# Patient Record
Sex: Female | Born: 1945 | Race: White | Hispanic: No | Marital: Married | Smoking: Never smoker
Health system: Southern US, Community
[De-identification: ages and names within clinical notes are randomized; demographics above are authoritative.]

## PROBLEM LIST (undated history)

## (undated) DIAGNOSIS — R4781 Slurred speech: Secondary | ICD-10-CM

## (undated) DIAGNOSIS — I10 Essential (primary) hypertension: Secondary | ICD-10-CM

## (undated) DIAGNOSIS — J45909 Unspecified asthma, uncomplicated: Secondary | ICD-10-CM

## (undated) DIAGNOSIS — G4730 Sleep apnea, unspecified: Secondary | ICD-10-CM

## (undated) DIAGNOSIS — G20 Parkinson's disease: Secondary | ICD-10-CM

## (undated) DIAGNOSIS — J449 Chronic obstructive pulmonary disease, unspecified: Secondary | ICD-10-CM

## (undated) DIAGNOSIS — I519 Heart disease, unspecified: Secondary | ICD-10-CM

## (undated) DIAGNOSIS — M797 Fibromyalgia: Secondary | ICD-10-CM

## (undated) DIAGNOSIS — K219 Gastro-esophageal reflux disease without esophagitis: Secondary | ICD-10-CM

## (undated) DIAGNOSIS — M1990 Unspecified osteoarthritis, unspecified site: Secondary | ICD-10-CM

## (undated) DIAGNOSIS — G20C Parkinsonism, unspecified: Secondary | ICD-10-CM

## (undated) DIAGNOSIS — G231 Progressive supranuclear ophthalmoplegia [Steele-Richardson-Olszewski]: Secondary | ICD-10-CM

## (undated) HISTORY — PX: CATARACT EXTRACTION: SUR2

## (undated) HISTORY — PX: APPENDECTOMY: SHX54

## (undated) HISTORY — PX: CHOLECYSTECTOMY: SHX55

## (undated) HISTORY — PX: SHOULDER SURGERY: SHX246

## (undated) HISTORY — DX: Fibromyalgia: M79.7

## (undated) HISTORY — PX: TUBAL LIGATION: SHX77

## (undated) HISTORY — PX: FRACTURE SURGERY: SHX138

## (undated) HISTORY — PX: EYE SURGERY: SHX253

## (undated) HISTORY — PX: BACK SURGERY: SHX140

## (undated) HISTORY — PX: ABDOMINAL HYSTERECTOMY: SHX81

## (undated) HISTORY — PX: TONSILLECTOMY: SUR1361

## (undated) HISTORY — PX: BREAST SURGERY: SHX581

## (undated) HISTORY — PX: OTHER SURGICAL HISTORY: SHX169

## (undated) HISTORY — PX: BREAST EXCISIONAL BIOPSY: SUR124

## (undated) HISTORY — DX: Heart disease, unspecified: I51.9

## (undated) HISTORY — DX: Slurred speech: R47.81

## (undated) HISTORY — DX: Progressive supranuclear ophthalmoplegia (steele-Richardson-olszewski): G23.1

---

## 2004-07-26 ENCOUNTER — Ambulatory Visit: Payer: Self-pay | Admitting: Pain Medicine

## 2004-08-09 ENCOUNTER — Ambulatory Visit: Payer: Self-pay | Admitting: Physician Assistant

## 2004-09-06 ENCOUNTER — Ambulatory Visit: Payer: Self-pay | Admitting: Physician Assistant

## 2004-12-26 ENCOUNTER — Ambulatory Visit: Payer: Self-pay | Admitting: Unknown Physician Specialty

## 2005-03-01 ENCOUNTER — Ambulatory Visit: Payer: Self-pay | Admitting: Unknown Physician Specialty

## 2005-03-27 ENCOUNTER — Ambulatory Visit: Payer: Self-pay | Admitting: Physician Assistant

## 2005-04-06 ENCOUNTER — Ambulatory Visit: Payer: Self-pay | Admitting: Pain Medicine

## 2005-04-20 ENCOUNTER — Ambulatory Visit: Payer: Self-pay | Admitting: Physician Assistant

## 2005-04-21 ENCOUNTER — Emergency Department: Payer: Self-pay | Admitting: Internal Medicine

## 2005-04-21 ENCOUNTER — Other Ambulatory Visit: Payer: Self-pay

## 2005-04-27 ENCOUNTER — Ambulatory Visit: Payer: Self-pay | Admitting: Physician Assistant

## 2005-04-28 ENCOUNTER — Ambulatory Visit: Payer: Self-pay | Admitting: Pain Medicine

## 2005-05-02 ENCOUNTER — Ambulatory Visit: Payer: Self-pay | Admitting: Physician Assistant

## 2005-05-08 ENCOUNTER — Encounter: Payer: Self-pay | Admitting: Pain Medicine

## 2005-05-10 ENCOUNTER — Ambulatory Visit: Payer: Self-pay | Admitting: Pain Medicine

## 2005-05-23 ENCOUNTER — Encounter: Payer: Self-pay | Admitting: Pain Medicine

## 2005-05-31 ENCOUNTER — Ambulatory Visit: Payer: Self-pay | Admitting: Physician Assistant

## 2005-08-31 ENCOUNTER — Ambulatory Visit: Payer: Self-pay | Admitting: Orthopaedic Surgery

## 2005-09-27 ENCOUNTER — Ambulatory Visit: Payer: Self-pay | Admitting: Physician Assistant

## 2005-10-17 ENCOUNTER — Ambulatory Visit: Payer: Self-pay | Admitting: Orthopaedic Surgery

## 2005-10-25 ENCOUNTER — Ambulatory Visit: Payer: Self-pay | Admitting: Physician Assistant

## 2006-01-25 ENCOUNTER — Ambulatory Visit: Payer: Self-pay | Admitting: Physician Assistant

## 2006-03-05 ENCOUNTER — Ambulatory Visit: Payer: Self-pay | Admitting: Internal Medicine

## 2006-03-07 ENCOUNTER — Ambulatory Visit: Payer: Self-pay | Admitting: Obstetrics and Gynecology

## 2006-05-23 ENCOUNTER — Ambulatory Visit: Payer: Self-pay | Admitting: Physician Assistant

## 2006-08-30 ENCOUNTER — Ambulatory Visit: Payer: Self-pay | Admitting: Physician Assistant

## 2006-10-01 ENCOUNTER — Ambulatory Visit: Payer: Self-pay | Admitting: Pain Medicine

## 2006-10-10 ENCOUNTER — Ambulatory Visit: Payer: Self-pay | Admitting: Pain Medicine

## 2006-10-24 ENCOUNTER — Ambulatory Visit: Payer: Self-pay | Admitting: Pain Medicine

## 2007-02-21 ENCOUNTER — Ambulatory Visit: Payer: Self-pay | Admitting: Physician Assistant

## 2007-03-12 ENCOUNTER — Ambulatory Visit: Payer: Self-pay | Admitting: Obstetrics and Gynecology

## 2007-05-17 ENCOUNTER — Ambulatory Visit: Payer: Self-pay | Admitting: Physician Assistant

## 2007-05-30 ENCOUNTER — Ambulatory Visit: Payer: Self-pay | Admitting: Pain Medicine

## 2007-06-13 ENCOUNTER — Ambulatory Visit: Payer: Self-pay | Admitting: Physician Assistant

## 2007-06-26 ENCOUNTER — Ambulatory Visit: Payer: Self-pay | Admitting: Pain Medicine

## 2007-06-27 ENCOUNTER — Ambulatory Visit: Payer: Self-pay | Admitting: Pain Medicine

## 2007-07-15 ENCOUNTER — Ambulatory Visit: Payer: Self-pay | Admitting: Physician Assistant

## 2007-07-23 ENCOUNTER — Ambulatory Visit: Payer: Self-pay | Admitting: Pain Medicine

## 2007-08-06 ENCOUNTER — Ambulatory Visit: Payer: Self-pay | Admitting: Physician Assistant

## 2007-08-23 ENCOUNTER — Ambulatory Visit: Payer: Self-pay | Admitting: Physician Assistant

## 2007-08-29 ENCOUNTER — Ambulatory Visit: Payer: Self-pay | Admitting: Pain Medicine

## 2007-09-09 ENCOUNTER — Ambulatory Visit: Payer: Self-pay | Admitting: Physician Assistant

## 2007-09-13 ENCOUNTER — Ambulatory Visit: Payer: Self-pay | Admitting: Physician Assistant

## 2007-10-21 ENCOUNTER — Ambulatory Visit: Payer: Self-pay | Admitting: Pain Medicine

## 2007-11-18 ENCOUNTER — Inpatient Hospital Stay: Payer: Self-pay | Admitting: Pain Medicine

## 2007-11-18 ENCOUNTER — Ambulatory Visit: Payer: Self-pay | Admitting: Pain Medicine

## 2007-12-04 ENCOUNTER — Ambulatory Visit: Payer: Self-pay | Admitting: Pain Medicine

## 2007-12-18 ENCOUNTER — Ambulatory Visit: Payer: Self-pay | Admitting: Pain Medicine

## 2007-12-18 ENCOUNTER — Ambulatory Visit: Payer: Self-pay | Admitting: Cardiology

## 2007-12-18 ENCOUNTER — Other Ambulatory Visit: Payer: Self-pay

## 2007-12-24 ENCOUNTER — Ambulatory Visit: Payer: Self-pay | Admitting: Pain Medicine

## 2008-01-02 ENCOUNTER — Ambulatory Visit: Payer: Self-pay | Admitting: Physician Assistant

## 2008-01-15 ENCOUNTER — Ambulatory Visit: Payer: Self-pay | Admitting: Pain Medicine

## 2008-01-23 ENCOUNTER — Ambulatory Visit: Payer: Self-pay | Admitting: Physician Assistant

## 2008-02-05 ENCOUNTER — Ambulatory Visit: Payer: Self-pay | Admitting: Physician Assistant

## 2008-02-11 ENCOUNTER — Ambulatory Visit: Payer: Self-pay | Admitting: Pain Medicine

## 2008-02-17 ENCOUNTER — Ambulatory Visit: Payer: Self-pay | Admitting: Pain Medicine

## 2008-02-26 ENCOUNTER — Ambulatory Visit: Payer: Self-pay | Admitting: Pain Medicine

## 2008-03-05 ENCOUNTER — Ambulatory Visit: Payer: Self-pay | Admitting: Physician Assistant

## 2008-03-24 ENCOUNTER — Ambulatory Visit: Payer: Self-pay | Admitting: Physician Assistant

## 2008-04-14 ENCOUNTER — Ambulatory Visit: Payer: Self-pay | Admitting: Obstetrics and Gynecology

## 2008-04-23 ENCOUNTER — Ambulatory Visit: Payer: Self-pay | Admitting: Physician Assistant

## 2008-04-30 ENCOUNTER — Ambulatory Visit: Payer: Self-pay | Admitting: Pain Medicine

## 2008-05-14 ENCOUNTER — Ambulatory Visit: Payer: Self-pay | Admitting: Physician Assistant

## 2008-06-16 ENCOUNTER — Ambulatory Visit: Payer: Self-pay | Admitting: Physician Assistant

## 2008-07-16 ENCOUNTER — Ambulatory Visit: Payer: Self-pay | Admitting: Physician Assistant

## 2008-07-23 ENCOUNTER — Ambulatory Visit: Payer: Self-pay | Admitting: Pain Medicine

## 2008-08-11 ENCOUNTER — Ambulatory Visit: Payer: Self-pay | Admitting: Physician Assistant

## 2008-09-07 ENCOUNTER — Ambulatory Visit: Payer: Self-pay | Admitting: Internal Medicine

## 2008-09-15 ENCOUNTER — Ambulatory Visit: Payer: Self-pay | Admitting: Pain Medicine

## 2008-10-08 ENCOUNTER — Ambulatory Visit: Payer: Self-pay | Admitting: Pain Medicine

## 2008-11-04 ENCOUNTER — Ambulatory Visit: Payer: Self-pay | Admitting: Physician Assistant

## 2008-11-13 ENCOUNTER — Encounter: Payer: Self-pay | Admitting: Pain Medicine

## 2008-11-23 ENCOUNTER — Encounter: Payer: Self-pay | Admitting: Pain Medicine

## 2008-12-02 ENCOUNTER — Ambulatory Visit: Payer: Self-pay | Admitting: Internal Medicine

## 2009-01-27 ENCOUNTER — Ambulatory Visit: Payer: Self-pay | Admitting: Physician Assistant

## 2009-02-04 ENCOUNTER — Ambulatory Visit: Payer: Self-pay | Admitting: Physician Assistant

## 2009-03-16 ENCOUNTER — Ambulatory Visit: Payer: Self-pay | Admitting: Pain Medicine

## 2009-04-15 ENCOUNTER — Ambulatory Visit: Payer: Self-pay | Admitting: Obstetrics and Gynecology

## 2009-06-03 ENCOUNTER — Ambulatory Visit: Payer: Self-pay | Admitting: Physician Assistant

## 2009-09-23 ENCOUNTER — Ambulatory Visit: Payer: Self-pay | Admitting: Physician Assistant

## 2009-12-16 ENCOUNTER — Ambulatory Visit: Payer: Self-pay | Admitting: Internal Medicine

## 2010-01-17 ENCOUNTER — Ambulatory Visit: Payer: Self-pay | Admitting: Pain Medicine

## 2010-01-19 ENCOUNTER — Ambulatory Visit: Payer: Self-pay | Admitting: Ophthalmology

## 2010-03-30 ENCOUNTER — Ambulatory Visit: Payer: Self-pay | Admitting: Gastroenterology

## 2010-04-13 ENCOUNTER — Ambulatory Visit: Payer: Self-pay | Admitting: Ophthalmology

## 2010-05-24 ENCOUNTER — Ambulatory Visit: Payer: Self-pay

## 2010-05-31 ENCOUNTER — Ambulatory Visit: Payer: Self-pay | Admitting: Obstetrics and Gynecology

## 2011-05-23 ENCOUNTER — Ambulatory Visit: Payer: Self-pay | Admitting: Pain Medicine

## 2011-07-18 ENCOUNTER — Ambulatory Visit: Payer: Self-pay | Admitting: Obstetrics and Gynecology

## 2011-10-20 ENCOUNTER — Ambulatory Visit: Payer: Self-pay | Admitting: Ophthalmology

## 2011-11-01 ENCOUNTER — Ambulatory Visit: Payer: Self-pay | Admitting: Ophthalmology

## 2012-07-18 ENCOUNTER — Ambulatory Visit: Payer: Self-pay | Admitting: Obstetrics and Gynecology

## 2013-07-29 ENCOUNTER — Ambulatory Visit: Payer: Self-pay | Admitting: Obstetrics and Gynecology

## 2013-08-15 ENCOUNTER — Ambulatory Visit: Payer: Self-pay | Admitting: Obstetrics and Gynecology

## 2014-06-03 ENCOUNTER — Emergency Department: Payer: Self-pay | Admitting: Emergency Medicine

## 2014-06-13 ENCOUNTER — Emergency Department: Payer: Self-pay | Admitting: Internal Medicine

## 2014-06-13 LAB — PROTIME-INR
INR: 1
Prothrombin Time: 13.4 secs (ref 11.5–14.7)

## 2014-06-13 LAB — COMPREHENSIVE METABOLIC PANEL
ANION GAP: 10 (ref 7–16)
Albumin: 3.4 g/dL (ref 3.4–5.0)
Alkaline Phosphatase: 180 U/L — ABNORMAL HIGH
BILIRUBIN TOTAL: 0.4 mg/dL (ref 0.2–1.0)
BUN: 13 mg/dL (ref 7–18)
CO2: 25 mmol/L (ref 21–32)
Calcium, Total: 9 mg/dL (ref 8.5–10.1)
Chloride: 107 mmol/L (ref 98–107)
Creatinine: 0.89 mg/dL (ref 0.60–1.30)
EGFR (African American): >60
Glucose: 97 mg/dL (ref 65–99)
Osmolality: 283 (ref 275–301)
Potassium: 4 mmol/L (ref 3.5–5.1)
SGOT(AST): 24 U/L (ref 15–37)
SGPT (ALT): 14 U/L
Sodium: 142 mmol/L (ref 136–145)
TOTAL PROTEIN: 7.3 g/dL (ref 6.4–8.2)

## 2014-06-13 LAB — CBC
HCT: 35.3 % (ref 35.0–47.0)
HGB: 11.5 g/dL — AB (ref 12.0–16.0)
MCH: 30.6 pg (ref 26.0–34.0)
MCHC: 32.5 g/dL (ref 32.0–36.0)
MCV: 94 fL (ref 80–100)
PLATELETS: 264 10*3/uL (ref 150–440)
RBC: 3.74 10*6/uL — ABNORMAL LOW (ref 3.80–5.20)
RDW: 12.3 % (ref 11.5–14.5)
WBC: 6.9 10*3/uL (ref 3.6–11.0)

## 2014-07-26 ENCOUNTER — Emergency Department: Payer: Self-pay | Admitting: Student

## 2014-07-30 ENCOUNTER — Ambulatory Visit: Payer: Self-pay | Admitting: Orthopedic Surgery

## 2014-07-30 DIAGNOSIS — I2510 Atherosclerotic heart disease of native coronary artery without angina pectoris: Secondary | ICD-10-CM

## 2014-07-30 DIAGNOSIS — I10 Essential (primary) hypertension: Secondary | ICD-10-CM

## 2014-09-14 ENCOUNTER — Ambulatory Visit: Payer: Self-pay | Admitting: Obstetrics and Gynecology

## 2014-09-25 ENCOUNTER — Ambulatory Visit: Payer: Self-pay | Admitting: Nurse Practitioner

## 2014-10-26 ENCOUNTER — Ambulatory Visit: Payer: Self-pay | Admitting: Gastroenterology

## 2015-02-13 NOTE — Op Note (Signed)
PATIENT NAME:  Tina Bowman, Tina Bowman MR#:  [id number] DATE OF BIRTH:  July 13, 1946  DATE OF PROCEDURE:  07/30/2014  PREOPERATIVE DIAGNOSIS:  Right distal clavicle fracture, displaced.   POSTOPERATIVE DIAGNOSIS:  Right distal clavicle fracture, displaced.  PROCEDURE:  Open reduction and internal fixation, right distal clavicle.   ANESTHESIA:  General.   SURGEON:  Leitha SchullerMichael J. Mahlia Fernando, MD   DESCRIPTION OF PROCEDURE:  The patient was brought to the operating room, and after adequate anesthesia was obtained, the patient was placed in a beach chair position and the shoulder prepped and draped in the usual sterile fashion. C-arm was brought in, and good visualization of the clavicle could be obtained. There was gross motion present at the fracture site on initial exam. After prepping and draping in the usual sterile fashion, patient identification and timeout procedures were completed. An oblique incision was made over the fracture site. The skin and subcutaneous tissues were splint with cutaneous nerves preserved as much as possible. The proximal fragment was easily exposed, and it had significant soft tissue stripping already present. The distal fragment was far posterior and appeared that the proximal fragment had pierced the deltoid, and it appeared that some of the deltoid had been elevated off of the distal fragment. When exposing the distal fragment, this was noted and the fragment was subsequently reduced with a reduction clamp. Attempt was made to make an anterior posterior lag screw, but the bone fragment was a little narrow and did not really hold well, so the appropriate plate was chosen and held in place with a clamp after some manipulation. The locking screws were placed proximally and a nonlocking screw distally initially in the small hole. This was subsequently traded out with a locking screw to get better, more rigid fixation. Drilling, measuring, and placing the locking screws was carried out throughout  the plate. It did take some time to get the reduction essentially anatomic, but after this was performed and held stable with the plate in place, the clavicle appeared to be anatomically reduced. The deltoid, which had been pulled off the bone off the distal fragment, was repaired using a #1 Vicryl and the subcutaneous tissue repaired using 2-0 Vicryl followed by 4-0 nylon for the skin, and 30 mL of 0.5% Sensorcaine was infiltrated around the incision for postoperative analgesia. An intraoperative x-ray was taken that showed appropriate position of the plate and screws with essentially anatomic alignment. The wound was thoroughly irrigated at the close of the procedure prior to closure.   IMPLANT:  Anterolateral 7-hole 2.7/3.7 Synthes plate with multiple locking screws.   ESTIMATED BLOOD LOSS:  50.   COMPLICATIONS:  None.   SPECIMEN:  None.    ____________________________ Leitha SchullerMichael J. Advit Trethewey, MD mjm:nb D: 07/30/2014 22:30:56 ET T: 07/31/2014 01:03:33 ET JOB#: [id number]  cc: Leitha SchullerMichael J. Terrion Poblano, MD, <Dictator> Leitha SchullerMICHAEL J Yara Tomkinson MD ELECTRONICALLY SIGNED 07/31/2014 8:37

## 2015-02-14 NOTE — Op Note (Signed)
PATIENT NAME:  Tina Bowman, Tina Bowman MR#:  [id number] DATE OF BIRTH:  05-14-1946  DATE OF PROCEDURE:  11/01/2011  PROCEDURES PERFORMED:  1. Pars plana vitrectomy of the left eye.  2. Internal limiting membrane peel of the left eye.  3. Gas exchange of the left eye.   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   ESTIMATED BLOOD LOSS: Less than 1 mL.   PRIMARY SURGEON: Ignacia FellingMatthew F. Lang Zingg, MD  ANESTHESIA: Retrobulbar block of the left eye with monitored anesthesia care.   COMPLICATIONS: None.  INDICATION FOR PROCEDURE: This is a patient who presented to my office with decreasing vision in her left eye. Left eye exam revealed a full thickness macular hole. Risks, benefits, and alternatives of the above procedure were discussed and the patient wished to proceed.   DETAILS OF PROCEDURE: After informed consent was obtained, the patient was brought into the operative suite at [facility]. Patient was placed in supine position, was given a small dose of propofol and a retrobulbar block was performed on the left eye by the primary surgeon without any complications. Left eye was prepped and draped in sterile manner. After lid speculum was inserted, a 25-gauge trocar was placed inferotemporally through displaced conjunctiva in an oblique fashion. This was done 3 mm beyond the limbus and the infusion cannula was turned on and inserted through the trocar and secured into position with Steri-Strips. Two more trocars were placed in a similar fashion superotemporally and superonasally. The vitreous cutter and light pipe were introduced in the eye and a core vitrectomy was performed. Vitreous base was elevated off of the retina and removed for 360 degrees. Care was taken in the peripheral vitrectomy to reduce traction. Indocyanine green was injected onto the posterior pole and removed within 30  seconds. Intraocular forceps were introduced and the internal limiting membrane was peeled for 360 degrees around the fovea for a total of two disk diameters. Scleral depressed exam was performed for 360 degrees. No retinal tears or retinal detachment could be identified for 360 degrees. A complete air-fluid exchange was performed. Four minutes was allowed to elapse for dehydration of the vitreous base and this remnant fluid was removed from the posterior pole as well. 22% SF6 was used as an Systems developerair-gas exchange and all the trocars were removed. The wounds were noted to be airtight and covered by the conjunctiva. Pressure in the eye was confirmed to be approximately 15 mmHg. Lid speculum was removed and 5 mg of dexamethasone was given into the inferior fornix. Pressure in the eye was again confirmed to be approximately 15 mmHg. The eye was cleaned and TobraDex was placed on the eye. A patch and shield were placed over the eye and the patient was taken to postanesthesia care with instructions to remain face down for the next five days.    ____________________________ Ignacia FellingMatthew F. Champ MungoAppenzeller, MD mfa:cms D: 11/01/2011 08:13:40 ET T: 11/01/2011 09:35:13 ET JOB#: [id number]  cc: Ignacia FellingMatthew F. Champ MungoAppenzeller, MD, <Dictator> Cline CoolsMATTHEW F Kalyan Barabas MD ELECTRONICALLY SIGNED 11/08/2011 7:13

## 2015-02-15 LAB — SURGICAL PATHOLOGY

## 2015-04-18 ENCOUNTER — Emergency Department
Admission: EM | Admit: 2015-04-18 | Discharge: 2015-04-18 | Disposition: A | Discharge status: Home / Self Care | Payer: Medicare Other | Attending: Emergency Medicine | Admitting: Emergency Medicine

## 2015-04-18 ENCOUNTER — Emergency Department: Payer: Medicare Other

## 2015-04-18 ENCOUNTER — Encounter: Payer: Self-pay | Admitting: Emergency Medicine

## 2015-04-18 DIAGNOSIS — T486X5A Adverse effect of antiasthmatics, initial encounter: Secondary | ICD-10-CM | POA: Insufficient documentation

## 2015-04-18 DIAGNOSIS — R000 Tachycardia, unspecified: Secondary | ICD-10-CM | POA: Insufficient documentation

## 2015-04-18 DIAGNOSIS — I10 Essential (primary) hypertension: Secondary | ICD-10-CM | POA: Insufficient documentation

## 2015-04-18 DIAGNOSIS — Z7982 Long term (current) use of aspirin: Secondary | ICD-10-CM | POA: Insufficient documentation

## 2015-04-18 DIAGNOSIS — J449 Chronic obstructive pulmonary disease, unspecified: Secondary | ICD-10-CM | POA: Insufficient documentation

## 2015-04-18 DIAGNOSIS — T50905A Adverse effect of unspecified drugs, medicaments and biological substances, initial encounter: Secondary | ICD-10-CM

## 2015-04-18 DIAGNOSIS — Z79899 Other long term (current) drug therapy: Secondary | ICD-10-CM | POA: Insufficient documentation

## 2015-04-18 HISTORY — DX: Unspecified asthma, uncomplicated: J45.909

## 2015-04-18 HISTORY — DX: Chronic obstructive pulmonary disease, unspecified: J44.9

## 2015-04-18 HISTORY — DX: Essential (primary) hypertension: I10

## 2015-04-18 LAB — CBC
HCT: 38.8 % (ref 35.0–47.0)
HEMOGLOBIN: 12.8 g/dL (ref 12.0–16.0)
MCH: 30.7 pg (ref 26.0–34.0)
MCHC: 32.9 g/dL (ref 32.0–36.0)
MCV: 93.3 fL (ref 80.0–100.0)
Platelets: 229 10*3/uL (ref 150–440)
RBC: 4.16 MIL/uL (ref 3.80–5.20)
RDW: 13.5 % (ref 11.5–14.5)
WBC: 8.7 10*3/uL (ref 3.6–11.0)

## 2015-04-18 LAB — BASIC METABOLIC PANEL
Anion gap: 11 (ref 5–15)
BUN: 16 mg/dL (ref 6–20)
CHLORIDE: 103 mmol/L (ref 101–111)
CO2: 25 mmol/L (ref 22–32)
Calcium: 9 mg/dL (ref 8.9–10.3)
Creatinine, Ser: 0.7 mg/dL (ref 0.44–1.00)
GFR calc Af Amer: >60 mL/min (ref >60)
GFR calc non Af Amer: >60 mL/min (ref >60)
GLUCOSE: 106 mg/dL — AB (ref 65–99)
Potassium: 3.9 mmol/L (ref 3.5–5.1)
SODIUM: 139 mmol/L (ref 135–145)

## 2015-04-18 LAB — FIBRIN DERIVATIVES D-DIMER (ARMC ONLY): Fibrin derivatives D-dimer (ARMC): 880.07 — ABNORMAL HIGH (ref 0–499)

## 2015-04-18 LAB — TROPONIN I: Troponin I: <0.03 ng/mL (ref <0.031)

## 2015-04-18 MED ORDER — IOHEXOL 350 MG/ML SOLN
100.0000 mL | Freq: Once | INTRAVENOUS | Status: AC | PRN
  Administered 2015-04-18: 100 mL via INTRAVENOUS

## 2015-04-18 MED ORDER — LORAZEPAM 2 MG/ML IJ SOLN
INTRAMUSCULAR | Status: AC
  Administered 2015-04-18: 0.5 mg via INTRAVENOUS
  Filled 2015-04-18: qty 1

## 2015-04-18 MED ORDER — SODIUM CHLORIDE 0.9 % IV BOLUS (SEPSIS)
1000.0000 mL | Freq: Once | INTRAVENOUS | Status: AC
  Administered 2015-04-18: 1000 mL via INTRAVENOUS

## 2015-04-18 MED ORDER — LORAZEPAM 2 MG/ML IJ SOLN
0.5000 mg | Freq: Once | INTRAMUSCULAR | Status: AC
  Administered 2015-04-18: 0.5 mg via INTRAVENOUS

## 2015-04-18 NOTE — ED Notes (Signed)
Patient resting quietly.  States medication helped. Spouse at bedside.

## 2015-04-18 NOTE — ED Notes (Signed)
Patient returned from CT. Resting quietly on stretcher. Back on monitor. Awaiting results.

## 2015-04-18 NOTE — ED Provider Notes (Signed)
[facility] Emergency Department Provider Note  ____________________________________________  Time seen: Approximately 10:30 AM  I have reviewed the triage vital signs and the nursing notes.   HISTORY  Chief Complaint Tachycardia    HPI Tina Bowman is a 69 y.o. female with asthma who presents with 2 days of racing heart and lightheadedness. She says that she recently had a asthma flare and finished prednisone this past Wednesday. She was then started on a new medicine, and inhaler called Brea by her primary care doctor. She started the medicine this past Thursday and started having feelings of palpitations and lightheadedness this Friday. She says that she is also been using her albuterol inhaler twice a day. She denies any chest pain. Has a cough and body aches. Denies fever. No sick contacts. No smoking.No pain with deep breathing.   Past Medical History  Diagnosis Date  . Asthma   . COPD (chronic obstructive pulmonary disease)   . Hypertension     There are no active problems to display for this patient.   Past Surgical History  Procedure Laterality Date  . Abdominal hysterectomy    . Back surgery    . Cholecystectomy    . Appendectomy    . Shoulder surgery      Current Outpatient Rx  Name  Route  Sig  Dispense  Refill  . albuterol (PROVENTIL HFA;VENTOLIN HFA) 108 (90 BASE) MCG/ACT inhaler   Inhalation   Inhale into the lungs every 6 (six) hours as needed for wheezing or shortness of breath.         Marland Kitchen albuterol (PROVENTIL) (5 MG/ML) 0.5% nebulizer solution   Nebulization   Take 2.5 mg by nebulization every 6 (six) hours as needed for wheezing or shortness of breath.         Marland Kitchen aspirin 81 MG tablet   Oral   Take 81 mg by mouth daily.         . calcium-vitamin D (OSCAL WITH D) 500-200 MG-UNIT per tablet   Oral   Take 1 tablet by mouth.         . cyclobenzaprine (FLEXERIL) 10 MG tablet   Oral   Take 10 mg by mouth 3 (three)  times daily as needed for muscle spasms.         . enalapril (VASOTEC) 20 MG tablet   Oral   Take 20 mg by mouth daily.         Marland Kitchen gemfibrozil (LOPID) 600 MG tablet   Oral   Take 600 mg by mouth 2 (two) times daily before a meal.         . ipratropium-albuterol (DUONEB) 0.5-2.5 (3) MG/3ML SOLN   Nebulization   Take 3 mLs by nebulization.         Marland Kitchen lubiprostone (AMITIZA) 24 MCG capsule   Oral   Take 24 mcg by mouth 2 (two) times daily with a meal.         . montelukast (SINGULAIR) 10 MG tablet   Oral   Take 10 mg by mouth at bedtime.         . pantoprazole (PROTONIX) 40 MG tablet   Oral   Take 40 mg by mouth daily.         . ranitidine (ZANTAC) 150 MG tablet   Oral   Take 150 mg by mouth 2 (two) times daily.         . traMADol (ULTRAM) 50 MG tablet   Oral  Take by mouth every 6 (six) hours as needed.           Allergies Methadone; Oxycontin; and Sulfa antibiotics  No family history on file.  Social History History  Substance Use Topics  . Smoking status: Never Smoker   . Smokeless tobacco: Not on file  . Alcohol Use: No    Review of Systems Constitutional: No fever/chills Eyes: No visual changes. ENT: No sore throat. Cardiovascular: Denies chest pain. Respiratory: As above Gastrointestinal: No abdominal pain.  No nausea, no vomiting.  No diarrhea.  No constipation. Genitourinary: Negative for dysuria. Musculoskeletal: Negative for back pain. Skin: Negative for rash. Neurological: Negative for headaches, focal weakness or numbness.  10-point ROS otherwise negative.  ____________________________________________   PHYSICAL EXAM:  VITAL SIGNS: ED Triage Vitals  Enc Vitals Group     BP 04/18/15 1009 133/70 mmHg     Pulse Rate 04/18/15 1009 102     Resp 04/18/15 1009 22     Temp 04/18/15 1009 99.3 F (37.4 C)     Temp Source 04/18/15 1009 Oral     SpO2 04/18/15 1009 95 %     Weight 04/18/15 1009 148 lb (67.132 kg)     Height  04/18/15 1009 5\' 2"  (1.575 m)     Head Cir --      Peak Flow --      Pain Score 04/18/15 1014 0     Pain Loc --      Pain Edu? --      Excl. in GC? --     Constitutional: Alert and oriented. Well appearing and in no acute distress. Eyes: Conjunctivae are normal. PERRL. EOMI. Head: Atraumatic. Nose: No congestion/rhinnorhea. Mouth/Throat: Mucous membranes are moist.  Oropharynx non-erythematous. Neck: No stridor.   Cardiovascular: Heart rate fluctuating between the 90s and low 100s. Maximum is 108 in the room. regular rhythm. Grossly normal heart sounds.  Good peripheral circulation. Respiratory: Normal respiratory effort.  No retractions. Lungs CTAB. Gastrointestinal: Soft and nontender. No distention. No abdominal bruits. No CVA tenderness. Musculoskeletal: No lower extremity tenderness nor edema.  No joint effusions. Neurologic:  Normal speech and language. No gross focal neurologic deficits are appreciated. Speech is normal. No gait instability. Skin:  Skin is warm, dry and intact. No rash noted. Psychiatric: Mood and affect are normal. Speech and behavior are normal.  ____________________________________________   LABS (all labs ordered are listed, but only abnormal results are displayed)  Labs Reviewed  BASIC METABOLIC PANEL - Abnormal; Notable for the following:    Glucose, Bld 106 (*)    All other components within normal limits  FIBRIN DERIVATIVES D-DIMER ([facility] ONLY) - Abnormal; Notable for the following:    Fibrin derivatives D-dimer ([facility]) 880.07 (*)    All other components within normal limits  CBC  TROPONIN I   ____________________________________________  EKG  ED ECG REPORT I, Arelia Longest, the attending physician, personally viewed and interpreted this ECG.   Date: 04/18/2015  EKG Time: 1008  Rate: 104  Rhythm: sinus tachycardia  Axis: Normal axis  Intervals:none  ST&T Change: No ST elevations or depressions. T-wave inversion in aVL which is  unchanged from EKG on 07/30/2014.  ____________________________________________  RADIOLOGY  Chest x-ray with atherosclerotic aortic arch. I personally reviewed these images. No acute PE.   PROCEDURES    ____________________________________________   INITIAL IMPRESSION / ASSESSMENT AND PLAN / ED COURSE  Pertinent labs & imaging results that were available during my care of the patient  were reviewed by me and considered in my medical decision making (see chart for details).  ----------------------------------------- 1:48 PM on 04/18/2015 ----------------------------------------- Patient resting comfortably with symptoms resolved. Heart rate in 80s. Bilateral areas of nodularity likely reflective of inflammatory process or infection. Patient given to take with her to her primary care doctor. She recently had an asthma flare and I feel that her CT scan is likely reflective of this.  Presentation likely secondary to new medication Gracelyn Nurse which has side effects including tachycardia. Patient to discontinue this medication and follow-up with her primary care doctor. We will take copy of her CAT scan report with her to her next appointment for further review with Dr. Park Breed.  ____________________________________________  ____________________________________________   FINAL CLINICAL IMPRESSION(S) / ED DIAGNOSES  Acute tachycardia. Acute medication side effect. Initial visit.    Myrna Blazer, MD 04/18/15 1350

## 2015-04-18 NOTE — ED Notes (Signed)
Pt with feeling of tachycardia this AM , started a new medication for asthma x2 days , also complaining of "flu like symptoms, with the body aches"

## 2015-04-18 NOTE — ED Notes (Signed)
Patient to CT.

## 2015-04-23 ENCOUNTER — Emergency Department
Admission: EM | Admit: 2015-04-23 | Discharge: 2015-04-23 | Disposition: A | Discharge status: Home / Self Care | Payer: Medicare Other | Attending: Emergency Medicine | Admitting: Emergency Medicine

## 2015-04-23 ENCOUNTER — Encounter: Payer: Self-pay | Admitting: Emergency Medicine

## 2015-04-23 ENCOUNTER — Other Ambulatory Visit: Payer: Self-pay

## 2015-04-23 DIAGNOSIS — I10 Essential (primary) hypertension: Secondary | ICD-10-CM | POA: Diagnosis not present

## 2015-04-23 DIAGNOSIS — R000 Tachycardia, unspecified: Secondary | ICD-10-CM | POA: Insufficient documentation

## 2015-04-23 DIAGNOSIS — B370 Candidal stomatitis: Secondary | ICD-10-CM | POA: Insufficient documentation

## 2015-04-23 DIAGNOSIS — Z79899 Other long term (current) drug therapy: Secondary | ICD-10-CM | POA: Diagnosis not present

## 2015-04-23 DIAGNOSIS — Z7982 Long term (current) use of aspirin: Secondary | ICD-10-CM | POA: Diagnosis not present

## 2015-04-23 LAB — CBC
HCT: 38.3 % (ref 35.0–47.0)
HEMOGLOBIN: 12.5 g/dL (ref 12.0–16.0)
MCH: 30.3 pg (ref 26.0–34.0)
MCHC: 32.5 g/dL (ref 32.0–36.0)
MCV: 93.2 fL (ref 80.0–100.0)
Platelets: 245 10*3/uL (ref 150–440)
RBC: 4.11 MIL/uL (ref 3.80–5.20)
RDW: 13.4 % (ref 11.5–14.5)
WBC: 6.6 10*3/uL (ref 3.6–11.0)

## 2015-04-23 LAB — BASIC METABOLIC PANEL
Anion gap: 10 (ref 5–15)
BUN: 12 mg/dL (ref 6–20)
CO2: 25 mmol/L (ref 22–32)
Calcium: 9.4 mg/dL (ref 8.9–10.3)
Chloride: 104 mmol/L (ref 101–111)
Creatinine, Ser: 0.75 mg/dL (ref 0.44–1.00)
GFR calc Af Amer: >60 mL/min (ref >60)
GFR calc non Af Amer: >60 mL/min (ref >60)
Glucose, Bld: 117 mg/dL — ABNORMAL HIGH (ref 65–99)
Potassium: 3.9 mmol/L (ref 3.5–5.1)
Sodium: 139 mmol/L (ref 135–145)

## 2015-04-23 LAB — TROPONIN I: Troponin I: <0.03 ng/mL (ref <0.031)

## 2015-04-23 MED ORDER — CLOTRIMAZOLE 10 MG MT TROC: 10.0000 mg | Freq: Every day | OROMUCOSAL | Status: DC

## 2015-04-23 NOTE — ED Notes (Addendum)
D/c instructions reviewed w/ pt - pt denies any further questions or concerns at present.   

## 2015-04-23 NOTE — ED Provider Notes (Signed)
[facility] Emergency Department Provider Note  ____________________________________________  Time seen: Approximately 3:50 PM  I have reviewed the triage vital signs and the nursing notes.   HISTORY  Chief Complaint Tachycardia    HPI Tina Bowman is a 69 y.o. female patient she has a day for rapid heartbeat. Patient stated onset after taking new treatment this morning. Patient states status post treatment she felt a near-syncope episode occurring  and came to the ER. Patient states she had the same complaint 2 days ago and came to the ER. Extensive cardiac workup which was unremarkable. She was told to decrease her last treatment from twice a day to once a day which she has complied. Patient denies any pain at this time states she feels anxious but there is no chest pain. She said also states her chest tightness has improved since taking the new medication treatment today. Patient also complaining of a coated tongue secondary to starting this new oral medication. Past Medical History  Diagnosis Date  . Asthma   . COPD (chronic obstructive pulmonary disease)   . Hypertension     There are no active problems to display for this patient.   Past Surgical History  Procedure Laterality Date  . Abdominal hysterectomy    . Back surgery    . Cholecystectomy    . Appendectomy    . Shoulder surgery      Current Outpatient Rx  Name  Route  Sig  Dispense  Refill  . albuterol (PROVENTIL HFA;VENTOLIN HFA) 108 (90 BASE) MCG/ACT inhaler   Inhalation   Inhale into the lungs every 6 (six) hours as needed for wheezing or shortness of breath.         Marland Kitchen albuterol (PROVENTIL) (5 MG/ML) 0.5% nebulizer solution   Nebulization   Take 2.5 mg by nebulization every 6 (six) hours as needed for wheezing or shortness of breath.         Marland Kitchen aspirin 81 MG tablet   Oral   Take 81 mg by mouth daily.         . calcium-vitamin D (OSCAL WITH D) 500-200 MG-UNIT per tablet  Oral   Take 1 tablet by mouth.         . clotrimazole (MYCELEX) 10 MG troche   Oral   Take 1 tablet (10 mg total) by mouth 5 (five) times daily.   25 tablet   0   . cyclobenzaprine (FLEXERIL) 10 MG tablet   Oral   Take 10 mg by mouth 3 (three) times daily as needed for muscle spasms.         . enalapril (VASOTEC) 20 MG tablet   Oral   Take 20 mg by mouth daily.         Marland Kitchen gemfibrozil (LOPID) 600 MG tablet   Oral   Take 600 mg by mouth 2 (two) times daily before a meal.         . ipratropium-albuterol (DUONEB) 0.5-2.5 (3) MG/3ML SOLN   Nebulization   Take 3 mLs by nebulization.         Marland Kitchen lubiprostone (AMITIZA) 24 MCG capsule   Oral   Take 24 mcg by mouth 2 (two) times daily with a meal.         . montelukast (SINGULAIR) 10 MG tablet   Oral   Take 10 mg by mouth at bedtime.         . pantoprazole (PROTONIX) 40 MG tablet   Oral  Take 40 mg by mouth daily.         . ranitidine (ZANTAC) 150 MG tablet   Oral   Take 150 mg by mouth 2 (two) times daily.         . traMADol (ULTRAM) 50 MG tablet   Oral   Take by mouth every 6 (six) hours as needed.           Allergies Methadone; Oxycontin; and Sulfa antibiotics  History reviewed. No pertinent family history.  Social History History  Substance Use Topics  . Smoking status: Never Smoker   . Smokeless tobacco: Not on file  . Alcohol Use: No    Review of Systems Constitutional: No fever/chills Eyes: No visual changes. ENT: No sore throat. Whitish coating on tongue Cardiovascular: Denies chest pain. Respiratory: Resolve wheezing Gastrointestinal: No abdominal pain.  No nausea, no vomiting.  No diarrhea.  No constipation. Genitourinary: Negative for dysuria. Musculoskeletal: Negative for back pain. Skin: Negative for rash. Neurological: Negative for headaches, focal weakness or numbness. Endocrine:Hypertension Hematological/Lymphatic: Allergic/Immunilogical: See medication list 10-point  ROS otherwise negative.  ____________________________________________   PHYSICAL EXAM:  VITAL SIGNS: ED Triage Vitals  Enc Vitals Group     BP 04/23/15 1237 140/79 mmHg     Pulse Rate 04/23/15 1237 114     Resp 04/23/15 1237 18     Temp 04/23/15 1237 97.9 F (36.6 C)     Temp Source 04/23/15 1237 Oral     SpO2 04/23/15 1237 98 %     Weight 04/23/15 1237 145 lb (65.772 kg)     Height 04/23/15 1237 5\' 2"  (1.575 m)     Head Cir --      Peak Flow --      Pain Score 04/23/15 1238 0     Pain Loc --      Pain Edu? --      Excl. in GC? --    Constitutional: Alert and oriented. Appears anxious Eyes: Conjunctivae are normal. PERRL. EOMI. Head: Atraumatic. Nose: No congestion/rhinnorhea. Mouth/Throat: Mucous membranes are moist.  Oropharynx non-erythematous. Tongue has a whitish coating Neck: No stridor.  No cervical spine tenderness to palpation. Hematological/Lymphatic/Immunilogical: No cervical lymphadenopathy. Cardiovascular: Normal rate, regular rhythm. Grossly normal heart sounds.  Good peripheral circulation. Elevated pulse of 115 Respiratory: Normal respiratory effort.  No retractions. Lungs CTAB. Gastrointestinal: Soft and nontender. No distention. No abdominal bruits. No CVA tenderness. Musculoskeletal: No lower extremity tenderness nor edema.  No joint effusions. Neurologic:  Normal speech and language. No gross focal neurologic deficits are appreciated. Speech is normal. No gait instability. Skin:  Skin is warm, dry and intact. No rash noted. Psychiatric: Mood and affect are normal. Speech and behavior are normal.  ____________________________________________   LABS (all labs ordered are listed, but only abnormal results are displayed)  Labs Reviewed  BASIC METABOLIC PANEL - Abnormal; Notable for the following:    Glucose, Bld 117 (*)    All other components within normal limits  CBC  TROPONIN I   ____________________________________________  EKG  Sinus  tachycardic 114 bpm ____________________________________________  RADIOLOGY   ____________________________________________   PROCEDURES  Procedure(s) performed: None  Critical Care performed: No  ____________________________________________   INITIAL IMPRESSION / ASSESSMENT AND PLAN / ED COURSE  Pertinent labs & imaging results that were available during my care of the patient were reviewed by me and considered in my medical decision making (see chart for details).  Tachycardic questions secondary to asthma medication. Patient is reassured no cardiac  event at this time. Advised to continue taking her medicine but decreased to asthma medication once a day as directed previously. Patient also have thrush and will be prescribed Diflucan. Patient advised to follow for treating doctor prescribed this medication for asthma to let him know the side effects of the medication. Patient given prescription for Mycelex for oral thrush. FINAL CLINICAL IMPRESSION(S) / ED DIAGNOSES  Final diagnoses:  Tachycardia with 100 - 120 beats per minute  Oral thrush      Joni Reining, PA-C 04/23/15 1612  Myrna Blazer, MD 04/23/15 [phone]

## 2015-04-23 NOTE — Discharge Instructions (Signed)
Nonspecific Tachycardia  Tachycardia is a faster than normal heartbeat (more than 100 beats per minute). In adults, the heart normally beats between 60 and 100 times a minute. A fast heartbeat may be a normal response to exercise or stress. It does not necessarily mean that something is wrong. However, sometimes when your heart beats too fast it may not be able to pump enough blood to the rest of your body. This can result in chest pain, shortness of breath, dizziness, and even fainting. Nonspecific tachycardia means that the specific cause or pattern of your tachycardia is unknown.  CAUSES   Tachycardia may be harmless or it may be due to a more serious underlying cause. Possible causes of tachycardia include:  · Exercise or exertion.  · Fever.  · Pain or injury.  · Infection.  · Loss of body fluids (dehydration).  · Overactive thyroid.  · Lack of red blood cells (anemia).  · Anxiety and stress.  · Alcohol.  · Caffeine.  · Tobacco products.  · Diet pills.  · Illegal drugs.  · Heart disease.  SYMPTOMS  · Rapid or irregular heartbeat (palpitations).  · Suddenly feeling your heart beating (cardiac awareness).  · Dizziness.  · Tiredness (fatigue).  · Shortness of breath.  · Chest pain.  · Nausea.  · Fainting.  DIAGNOSIS   Your caregiver will perform a physical exam and take your medical history. In some cases, a heart specialist (cardiologist) may be consulted. Your caregiver may also order:  · Blood tests.  · Electrocardiography. This test records the electrical activity of your heart.  · A heart monitoring test.  TREATMENT   Treatment will depend on the likely cause of your tachycardia. The goal is to treat the underlying cause of your tachycardia. Treatment methods may include:  · Replacement of fluids or blood through an intravenous (IV) tube for moderate to severe dehydration or anemia.  · New medicines or changes in your current medicines.  · Diet and lifestyle changes.  · Treatment for certain  infections.  · Stress relief or relaxation methods.  HOME CARE INSTRUCTIONS   · Rest.  · Drink enough fluids to keep your urine clear or pale yellow.  · Do not smoke.  · Avoid:  ¨ Caffeine.  ¨ Tobacco.  ¨ Alcohol.  ¨ Chocolate.  ¨ Stimulants such as over-the-counter diet pills or pills that help you stay awake.  ¨ Situations that cause anxiety or stress.  ¨ Illegal drugs such as marijuana, phencyclidine (PCP), and cocaine.  · Only take medicine as directed by your caregiver.  · Keep all follow-up appointments as directed by your caregiver.  SEEK IMMEDIATE MEDICAL CARE IF:   · You have pain in your chest, upper arms, jaw, or neck.  · You become weak, dizzy, or feel faint.  · You have palpitations that will not go away.  · You vomit, have diarrhea, or pass blood in your stool.  · Your skin is cool, pale, and wet.  · You have a fever that will not go away with rest, fluids, and medicine.  MAKE SURE YOU:   · Understand these instructions.  · Will watch your condition.  · Will get help right away if you are not doing well or get worse.  Document Released: 11/16/2004 Document Revised: 01/01/2012 Document Reviewed: 09/19/2011  ExitCare® Patient Information ©2015 [employer]. This information is not intended to replace advice given to you by your health care provider. Make sure you discuss any questions   you have with your health care provider.

## 2015-04-23 NOTE — ED Notes (Signed)
Pt to ed with c/o rapid heart beat this am.  Pt states she was seen here on Sunday for the same problem.  Pt reports she got up this am and took a breathing treatment and then about 11 am started to feel like heart was beating fast.  Pt also reports feeling like she was going to pass out.  Pt appears in nad at this time, skin warm and dry.

## 2015-06-01 ENCOUNTER — Other Ambulatory Visit: Payer: Self-pay | Admitting: Specialist

## 2015-06-01 DIAGNOSIS — R0602 Shortness of breath: Secondary | ICD-10-CM

## 2015-06-01 DIAGNOSIS — R918 Other nonspecific abnormal finding of lung field: Secondary | ICD-10-CM

## 2015-08-31 ENCOUNTER — Other Ambulatory Visit: Payer: Self-pay | Admitting: Internal Medicine

## 2015-08-31 DIAGNOSIS — E0590 Thyrotoxicosis, unspecified without thyrotoxic crisis or storm: Secondary | ICD-10-CM

## 2015-09-01 ENCOUNTER — Ambulatory Visit
Admission: RE | Admit: 2015-09-01 | Discharge: 2015-09-01 | Disposition: A | Discharge status: Home / Self Care | Payer: Medicare Other | Source: Ambulatory Visit | Attending: Specialist | Admitting: Specialist

## 2015-09-01 DIAGNOSIS — J189 Pneumonia, unspecified organism: Secondary | ICD-10-CM | POA: Insufficient documentation

## 2015-09-01 DIAGNOSIS — R0602 Shortness of breath: Secondary | ICD-10-CM | POA: Diagnosis present

## 2015-09-01 DIAGNOSIS — R918 Other nonspecific abnormal finding of lung field: Secondary | ICD-10-CM

## 2015-09-01 DIAGNOSIS — R911 Solitary pulmonary nodule: Secondary | ICD-10-CM | POA: Diagnosis present

## 2015-09-01 DIAGNOSIS — I2510 Atherosclerotic heart disease of native coronary artery without angina pectoris: Secondary | ICD-10-CM | POA: Diagnosis not present

## 2015-09-01 MED ORDER — IOHEXOL 300 MG/ML  SOLN
75.0000 mL | Freq: Once | INTRAMUSCULAR | Status: AC | PRN
  Administered 2015-09-01: 75 mL via INTRAVENOUS

## 2015-09-13 ENCOUNTER — Encounter
Admission: RE | Admit: 2015-09-13 | Discharge: 2015-09-13 | Disposition: A | Discharge status: Home / Self Care | Payer: Medicare Other | Source: Ambulatory Visit | Attending: Internal Medicine | Admitting: Internal Medicine

## 2015-09-13 DIAGNOSIS — E0590 Thyrotoxicosis, unspecified without thyrotoxic crisis or storm: Secondary | ICD-10-CM | POA: Insufficient documentation

## 2015-09-15 ENCOUNTER — Other Ambulatory Visit: Payer: Self-pay | Admitting: Obstetrics and Gynecology

## 2015-09-15 DIAGNOSIS — Z1231 Encounter for screening mammogram for malignant neoplasm of breast: Secondary | ICD-10-CM

## 2015-09-23 ENCOUNTER — Ambulatory Visit
Admission: RE | Admit: 2015-09-23 | Discharge: 2015-09-23 | Disposition: A | Discharge status: Home / Self Care | Payer: Medicare Other | Source: Ambulatory Visit | Attending: Obstetrics and Gynecology | Admitting: Obstetrics and Gynecology

## 2015-09-23 DIAGNOSIS — Z1231 Encounter for screening mammogram for malignant neoplasm of breast: Secondary | ICD-10-CM | POA: Diagnosis not present

## 2015-12-08 ENCOUNTER — Encounter: Admission: RE | Admit: 2015-12-08 | Payer: Medicare HMO | Source: Ambulatory Visit

## 2015-12-08 ENCOUNTER — Encounter
Admission: RE | Admit: 2015-12-08 | Discharge: 2015-12-08 | Disposition: A | Discharge status: Home / Self Care | Payer: Medicare HMO | Source: Ambulatory Visit | Attending: Internal Medicine | Admitting: Internal Medicine

## 2015-12-08 DIAGNOSIS — E0590 Thyrotoxicosis, unspecified without thyrotoxic crisis or storm: Secondary | ICD-10-CM | POA: Diagnosis not present

## 2015-12-08 MED ORDER — SODIUM IODIDE I-123 3.7 MBQ PO CAPS
159.0000 | ORAL_CAPSULE | Freq: Once | ORAL | Status: AC
  Administered 2015-12-08: 159 via ORAL

## 2015-12-09 ENCOUNTER — Encounter
Admission: RE | Admit: 2015-12-09 | Discharge: 2015-12-09 | Disposition: A | Discharge status: Home / Self Care | Payer: Medicare HMO | Source: Ambulatory Visit | Attending: Internal Medicine | Admitting: Internal Medicine

## 2016-04-11 ENCOUNTER — Emergency Department: Payer: Medicare HMO

## 2016-04-11 ENCOUNTER — Emergency Department
Admission: EM | Admit: 2016-04-11 | Discharge: 2016-04-11 | Disposition: A | Discharge status: Home / Self Care | Payer: Medicare HMO | Attending: Emergency Medicine | Admitting: Emergency Medicine

## 2016-04-11 ENCOUNTER — Encounter: Payer: Self-pay | Admitting: Emergency Medicine

## 2016-04-11 ENCOUNTER — Other Ambulatory Visit: Payer: Self-pay

## 2016-04-11 DIAGNOSIS — J069 Acute upper respiratory infection, unspecified: Secondary | ICD-10-CM

## 2016-04-11 DIAGNOSIS — R0602 Shortness of breath: Secondary | ICD-10-CM | POA: Diagnosis present

## 2016-04-11 DIAGNOSIS — J441 Chronic obstructive pulmonary disease with (acute) exacerbation: Secondary | ICD-10-CM | POA: Insufficient documentation

## 2016-04-11 DIAGNOSIS — I10 Essential (primary) hypertension: Secondary | ICD-10-CM | POA: Insufficient documentation

## 2016-04-11 DIAGNOSIS — J45909 Unspecified asthma, uncomplicated: Secondary | ICD-10-CM | POA: Insufficient documentation

## 2016-04-11 DIAGNOSIS — Z79899 Other long term (current) drug therapy: Secondary | ICD-10-CM | POA: Diagnosis not present

## 2016-04-11 DIAGNOSIS — Z7982 Long term (current) use of aspirin: Secondary | ICD-10-CM | POA: Insufficient documentation

## 2016-04-11 MED ORDER — PREDNISONE 20 MG PO TABS: 60.0000 mg | ORAL_TABLET | Freq: Every day | ORAL | Status: DC

## 2016-04-11 MED ORDER — ALBUTEROL SULFATE (2.5 MG/3ML) 0.083% IN NEBU
5.0000 mg | INHALATION_SOLUTION | Freq: Once | RESPIRATORY_TRACT | Status: DC
  Filled 2016-04-11: qty 6

## 2016-04-11 MED ORDER — DOXYCYCLINE HYCLATE 100 MG PO TABS: 100.0000 mg | ORAL_TABLET | Freq: Two times a day (BID) | ORAL | Status: DC

## 2016-04-11 MED ORDER — DOXYCYCLINE HYCLATE 100 MG PO TABS
100.0000 mg | ORAL_TABLET | Freq: Once | ORAL | Status: AC
  Administered 2016-04-11: 100 mg via ORAL
  Filled 2016-04-11: qty 1

## 2016-04-11 MED ORDER — AZITHROMYCIN 500 MG PO TABS: 500.0000 mg | ORAL_TABLET | Freq: Once | ORAL | Status: DC

## 2016-04-11 MED ORDER — PREDNISONE 20 MG PO TABS
60.0000 mg | ORAL_TABLET | Freq: Once | ORAL | Status: AC
  Administered 2016-04-11: 60 mg via ORAL
  Filled 2016-04-11: qty 3

## 2016-04-11 NOTE — ED Notes (Signed)
Pt to xray

## 2016-04-11 NOTE — ED Provider Notes (Signed)
[facility] Emergency Department Provider Note   ____________________________________________  Time seen: Approximately 10 PM  I have reviewed the triage vital signs and the nursing notes.   HISTORY  Chief Complaint Shortness of Breath   HPI Tina Bowman is a 70 y.o. female with a history of asthma and COPD who has been having a cough productive of clear to white sputum for one week. She says that her husband has been sick with a similar illness. She says that she also has been having clear rhinorrhea. No sore throat or runny nose. Some mild nausea but no vomiting. No chest pain. No abdominal pain. No diarrhea. She says that she has been taking breathing treatments at home but has not had any improvement. She denies smoking.   Past Medical History  Diagnosis Date  . Asthma   . COPD (chronic obstructive pulmonary disease) (HCC)   . Hypertension     There are no active problems to display for this patient.   Past Surgical History  Procedure Laterality Date  . Abdominal hysterectomy    . Back surgery    . Cholecystectomy    . Appendectomy    . Shoulder surgery    . Breast biopsy Left     Current Outpatient Rx  Name  Route  Sig  Dispense  Refill  . albuterol (PROVENTIL HFA;VENTOLIN HFA) 108 (90 BASE) MCG/ACT inhaler   Inhalation   Inhale into the lungs every 6 (six) hours as needed for wheezing or shortness of breath.         Marland Kitchen. albuterol (PROVENTIL) (5 MG/ML) 0.5% nebulizer solution   Nebulization   Take 2.5 mg by nebulization every 6 (six) hours as needed for wheezing or shortness of breath.         Marland Kitchen. aspirin 81 MG tablet   Oral   Take 81 mg by mouth daily.         . calcium-vitamin D (OSCAL WITH D) 500-200 MG-UNIT per tablet   Oral   Take 1 tablet by mouth.         . clotrimazole (MYCELEX) 10 MG troche   Oral   Take 1 tablet (10 mg total) by mouth 5 (five) times daily.   25 tablet   0   . cyclobenzaprine (FLEXERIL) 10  MG tablet   Oral   Take 10 mg by mouth 3 (three) times daily as needed for muscle spasms.         . enalapril (VASOTEC) 20 MG tablet   Oral   Take 20 mg by mouth daily.         Marland Kitchen. gemfibrozil (LOPID) 600 MG tablet   Oral   Take 600 mg by mouth 2 (two) times daily before a meal.         . ipratropium-albuterol (DUONEB) 0.5-2.5 (3) MG/3ML SOLN   Nebulization   Take 3 mLs by nebulization.         Marland Kitchen. lubiprostone (AMITIZA) 24 MCG capsule   Oral   Take 24 mcg by mouth 2 (two) times daily with a meal.         . montelukast (SINGULAIR) 10 MG tablet   Oral   Take 10 mg by mouth at bedtime.         . pantoprazole (PROTONIX) 40 MG tablet   Oral   Take 40 mg by mouth daily.         . ranitidine (ZANTAC) 150 MG tablet   Oral  Take 150 mg by mouth 2 (two) times daily.         . traMADol (ULTRAM) 50 MG tablet   Oral   Take by mouth every 6 (six) hours as needed.           Allergies Methadone; Niacin and related; Oxycontin; and Sulfa antibiotics  Family History  Problem Relation Age of Onset  . Breast cancer Maternal Aunt   . Breast cancer Cousin     Social History Social History  Substance Use Topics  . Smoking status: Never Smoker   . Smokeless tobacco: None  . Alcohol Use: No    Review of Systems Constitutional: No fever/chills Eyes: No visual changes. ENT: No sore throat. Cardiovascular: Denies chest pain. Respiratory: As above Gastrointestinal: No abdominal pain.   no vomiting.  No diarrhea.  No constipation. Genitourinary: Negative for dysuria. Musculoskeletal: Negative for back pain. Skin: Negative for rash. Neurological: Negative for headaches, focal weakness or numbness.  10-point ROS otherwise negative.  ____________________________________________   PHYSICAL EXAM:  VITAL SIGNS: ED Triage Vitals  Enc Vitals Group     BP 04/11/16 2116 104/82 mmHg     Pulse Rate 04/11/16 2116 78     Resp 04/11/16 2116 20     Temp 04/11/16 2116  97.8 F (36.6 C)     Temp Source 04/11/16 2116 Oral     SpO2 04/11/16 2116 96 %     Weight 04/11/16 2116 144 lb (65.318 kg)     Height 04/11/16 2116 5\' 2"  (1.575 m)     Head Cir --      Peak Flow --      Pain Score 04/11/16 2117 0     Pain Loc --      Pain Edu? --      Excl. in GC? --     Constitutional: Alert and oriented. Well appearing and in no acute distress. Eyes: Conjunctivae are normal. PERRL. EOMI. Head: Atraumatic. Nose: Mild bilateral rhinorrhea. Mouth/Throat: Mucous membranes are moist.  Oropharynx non-erythematous. Neck: No stridor.   Cardiovascular: Normal rate, regular rhythm. Grossly normal heart sounds.  Good peripheral circulation. Respiratory: Normal respiratory effort.  No retractions. Mild to moderate wheezing throughout. No retractions. Speaking in full sentences. Gastrointestinal: Soft and nontender. No distention. No abdominal bruits. No CVA tenderness. Musculoskeletal: No lower extremity tenderness nor edema.  No joint effusions. Neurologic:  Normal speech and language. No gross focal neurologic deficits are appreciated.  Skin:  Skin is warm, dry and intact. No rash noted. Psychiatric: Mood and affect are normal. Speech and behavior are normal.  ____________________________________________   LABS (all labs ordered are listed, but only abnormal results are displayed)  Labs Reviewed - No data to display ____________________________________________  EKG  ED ECG REPORT I, Rykar Lebleu,  Teena Irani, the attending physician, personally viewed and interpreted this ECG.   Date: 04/11/2016  EKG Time: 2113  Rate: 87  Rhythm: normal sinus rhythm  Axis: Normal axis  Intervals:none  ST&T Change: No ST segment elevation or depression. No abnormal T-wave inversion.  ____________________________________________  RADIOLOGY   DG Chest 2 View (Final result) Result time: 04/11/16 21:41:49   Final result by Rad Results In Interface (04/11/16 21:41:49)    Narrative:   CLINICAL DATA: C/O cough and congestion at home since Thursday. Decreased PO intake as well. Tonight, felt SOB. Used nebulizer, but symptoms did not improve.  EXAM: CHEST 2 VIEW  COMPARISON: Chest CT 09/01/2015  FINDINGS: Normal mediastinum and cardiac silhouette. Normal pulmonary  vasculature. No evidence of effusion, infiltrate, or pneumothorax. No acute bony abnormality. Plate fixation of RIGHT clavicle. Demineralization of the distal of clavicle. Catheter in the central canal of the thoracic spine  IMPRESSION: No active cardiopulmonary disease.   Electronically Signed By: Genevive Bi M.D. On: 04/11/2016 21:41    ____________________________________________   PROCEDURES    ____________________________________________   INITIAL IMPRESSION / ASSESSMENT AND PLAN / ED COURSE  Pertinent labs & imaging results that were available during my care of the patient were reviewed by me and considered in my medical decision making (see chart for details).  ----------------------------------------- 10:27 PM on 04/11/2016 -----------------------------------------  Patient without any respiratory distress. I offered to give her breathing treatments here in the emergency department but said that the breathing treatment she last took at home made her very jittery. She says that her hands and feet also went numb temporarily. She does not want any further breathing treatments but says she would like to take the prednisone as well as antibiotic here. Intervals were normal by the EKG machine but appears to have a prolonged QT in the lateral leads in V4 through V6. Will discharge with doxycycline. The patient also says that she has a follow-up appointment tomorrow with her primary care doctor. I told her that she must come back to the emergency department immediately for any worsening or concerning symptoms. I also offered that she may be observed for several hours  after seeing her prednisone but she said that she would rather go home. ____________________________________________   FINAL CLINICAL IMPRESSION(S) / ED DIAGNOSES  COPD exacerbation. URI.    NEW MEDICATIONS STARTED DURING THIS VISIT:  New Prescriptions   No medications on file     Note:  This document was prepared using Dragon voice recognition software and may include unintentional dictation errors.    Myrna Blazer, MD 04/11/16 2229

## 2016-04-11 NOTE — Discharge Instructions (Signed)
Chronic Obstructive Pulmonary Disease Chronic obstructive pulmonary disease (COPD) is a common lung problem. In COPD, the flow of air from the lungs is limited. The way your lungs work will probably never return to normal, but there are things you can do to improve your lungs and make yourself feel better. Your doctor may treat your condition with:  Medicines.  Oxygen.  Lung surgery.  Changes to your diet.  Rehabilitation. This may involve a team of specialists. HOME CARE  Take all medicines as told by your doctor.  Avoid medicines or cough syrups that dry up your airway (such as antihistamines) and do not allow you to get rid of thick spit. You do not need to avoid them if told differently by your doctor.  If you smoke, stop. Smoking makes the problem worse.  Avoid being around things that make your breathing worse (like smoke, chemicals, and fumes).  Use oxygen therapy and therapy to help improve your lungs (pulmonary rehabilitation) if told by your doctor. If you need home oxygen therapy, ask your doctor if you should buy a tool to measure your oxygen level (oximeter).  Avoid people who have a sickness you can catch (contagious).  Avoid going outside when it is very hot, cold, or humid.  Eat healthy foods. Eat smaller meals more often. Rest before meals.  Stay active, but remember to also rest.  Make sure to get all the shots (vaccines) your doctor recommends. Ask your doctor if you need a pneumonia shot.  Learn and use tips on how to relax.  Learn and use tips on how to control your breathing as told by your doctor. Try:  Breathing in (inhaling) through your nose for 1 second. Then, pucker your lips and breath out (exhale) through your lips for 2 seconds.  Putting one hand on your belly (abdomen). Breathe in slowly through your nose for 1 second. Your hand on your belly should move out. Pucker your lips and breathe out slowly through your lips. Your hand on your belly  should move in as you breathe out.  Learn and use controlled coughing to clear thick spit from your lungs. The steps are:  Lean your head a little forward.  Breathe in deeply.  Try to hold your breath for 3 seconds.  Keep your mouth slightly open while coughing 2 times.  Spit any thick spit out into a tissue.  Rest and do the steps again 1 or 2 times as needed. GET HELP IF:  You cough up more thick spit than usual.  There is a change in the color or thickness of the spit.  It is harder to breathe than usual.  Your breathing is faster than usual. GET HELP RIGHT AWAY IF:  You have shortness of breath while resting.  You have shortness of breath that stops you from:  Being able to talk.  Doing normal activities.  You chest hurts for longer than 5 minutes.  Your skin color is more blue than usual.  Your pulse oximeter shows that you have low oxygen for longer than 5 minutes. MAKE SURE YOU:  Understand these instructions.  Will watch your condition.  Will get help right away if you are not doing well or get worse.   This information is not intended to replace advice given to you by your health care provider. Make sure you discuss any questions you have with your health care provider.   Document Released: 03/27/2008 Document Revised: 10/30/2014 Document Reviewed: 06/05/2013 Elsevier Interactive Patient  Education 2016 [employer]  Upper Respiratory Infection, Adult Most upper respiratory infections (URIs) are a viral infection of the air passages leading to the lungs. A URI affects the nose, throat, and upper air passages. The most common type of URI is nasopharyngitis and is typically referred to as "the common cold." URIs run their course and usually go away on their own. Most of the time, a URI does not require medical attention, but sometimes a bacterial infection in the upper airways can follow a viral infection. This is called a secondary infection. Sinus and  middle ear infections are common types of secondary upper respiratory infections. Bacterial pneumonia can also complicate a URI. A URI can worsen asthma and chronic obstructive pulmonary disease (COPD). Sometimes, these complications can require emergency medical care and may be life threatening.  CAUSES Almost all URIs are caused by viruses. A virus is a type of germ and can spread from one person to another.  RISKS FACTORS You may be at risk for a URI if:   You smoke.   You have chronic heart or lung disease.  You have a weakened defense (immune) system.   You are very young or very old.   You have nasal allergies or asthma.  You work in crowded or poorly ventilated areas.  You work in health care facilities or schools. SIGNS AND SYMPTOMS  Symptoms typically develop 2-3 days after you come in contact with a cold virus. Most viral URIs last 7-10 days. However, viral URIs from the influenza virus (flu virus) can last 14-18 days and are typically more severe. Symptoms may include:   Runny or stuffy (congested) nose.   Sneezing.   Cough.   Sore throat.   Headache.   Fatigue.   Fever.   Loss of appetite.   Pain in your forehead, behind your eyes, and over your cheekbones (sinus pain).  Muscle aches.  DIAGNOSIS  Your health care provider may diagnose a URI by:  Physical exam.  Tests to check that your symptoms are not due to another condition such as:  Strep throat.  Sinusitis.  Pneumonia.  Asthma. TREATMENT  A URI goes away on its own with time. It cannot be cured with medicines, but medicines may be prescribed or recommended to relieve symptoms. Medicines may help:  Reduce your fever.  Reduce your cough.  Relieve nasal congestion. HOME CARE INSTRUCTIONS   Take medicines only as directed by your health care provider.   Gargle warm saltwater or take cough drops to comfort your throat as directed by your health care provider.  Use a warm  mist humidifier or inhale steam from a shower to increase air moisture. This may make it easier to breathe.  Drink enough fluid to keep your urine clear or pale yellow.   Eat soups and other clear broths and maintain good nutrition.   Rest as needed.   Return to work when your temperature has returned to normal or as your health care provider advises. You may need to stay home longer to avoid infecting others. You can also use a face mask and careful hand washing to prevent spread of the virus.  Increase the usage of your inhaler if you have asthma.   Do not use any tobacco products, including cigarettes, chewing tobacco, or electronic cigarettes. If you need help quitting, ask your health care provider. PREVENTION  The best way to protect yourself from getting a cold is to practice good hygiene.   Avoid oral or  hand contact with people with cold symptoms.   Wash your hands often if contact occurs.  There is no clear evidence that vitamin C, vitamin E, echinacea, or exercise reduces the chance of developing a cold. However, it is always recommended to get plenty of rest, exercise, and practice good nutrition.  SEEK MEDICAL CARE IF:   You are getting worse rather than better.   Your symptoms are not controlled by medicine.   You have chills.  You have worsening shortness of breath.  You have brown or red mucus.  You have yellow or brown nasal discharge.  You have pain in your face, especially when you bend forward.  You have a fever.  You have swollen neck glands.  You have pain while swallowing.  You have white areas in the back of your throat. SEEK IMMEDIATE MEDICAL CARE IF:   You have severe or persistent:  Headache.  Ear pain.  Sinus pain.  Chest pain.  You have chronic lung disease and any of the following:  Wheezing.  Prolonged cough.  Coughing up blood.  A change in your usual mucus.  You have a stiff neck.  You have changes in  your:  Vision.  Hearing.  Thinking.  Mood. MAKE SURE YOU:   Understand these instructions.  Will watch your condition.  Will get help right away if you are not doing well or get worse.   This information is not intended to replace advice given to you by your health care provider. Make sure you discuss any questions you have with your health care provider.   Document Released: 04/04/2001 Document Revised: 02/23/2015 Document Reviewed: 01/14/2014 Elsevier Interactive Patient Education [employer]

## 2016-04-11 NOTE — ED Notes (Signed)
Pt discharged to home.  Family member driving.  Discharge instructions reviewed.  Verbalized understanding.  No questions or concerns at this time.  Teach back verified.  Pt in NAD.  No items left in ED.   

## 2016-04-11 NOTE — ED Notes (Signed)
C/O cough and congestion at home since Thursday.  Decreased PO intake as well.  Tonight, felt SOB.  Used nebulizer, but symptoms did not improve.

## 2016-08-24 ENCOUNTER — Emergency Department: Payer: Medicare HMO

## 2016-08-24 ENCOUNTER — Emergency Department
Admission: EM | Admit: 2016-08-24 | Discharge: 2016-08-24 | Disposition: A | Discharge status: Home / Self Care | Payer: Medicare HMO | Attending: Emergency Medicine | Admitting: Emergency Medicine

## 2016-08-24 ENCOUNTER — Encounter: Payer: Self-pay | Admitting: Emergency Medicine

## 2016-08-24 DIAGNOSIS — Z79899 Other long term (current) drug therapy: Secondary | ICD-10-CM | POA: Diagnosis not present

## 2016-08-24 DIAGNOSIS — J45909 Unspecified asthma, uncomplicated: Secondary | ICD-10-CM | POA: Insufficient documentation

## 2016-08-24 DIAGNOSIS — I10 Essential (primary) hypertension: Secondary | ICD-10-CM | POA: Insufficient documentation

## 2016-08-24 DIAGNOSIS — J449 Chronic obstructive pulmonary disease, unspecified: Secondary | ICD-10-CM | POA: Insufficient documentation

## 2016-08-24 DIAGNOSIS — R0789 Other chest pain: Secondary | ICD-10-CM | POA: Insufficient documentation

## 2016-08-24 DIAGNOSIS — Z7982 Long term (current) use of aspirin: Secondary | ICD-10-CM | POA: Insufficient documentation

## 2016-08-24 LAB — COMPREHENSIVE METABOLIC PANEL
ALK PHOS: 85 U/L (ref 38–126)
ALT: 24 U/L (ref 14–54)
AST: 30 U/L (ref 15–41)
Albumin: 4.1 g/dL (ref 3.5–5.0)
Anion gap: 8 (ref 5–15)
BUN: 12 mg/dL (ref 6–20)
CALCIUM: 9 mg/dL (ref 8.9–10.3)
CHLORIDE: 108 mmol/L (ref 101–111)
CO2: 25 mmol/L (ref 22–32)
CREATININE: 0.69 mg/dL (ref 0.44–1.00)
Glucose, Bld: 118 mg/dL — ABNORMAL HIGH (ref 65–99)
Potassium: 3.5 mmol/L (ref 3.5–5.1)
Sodium: 141 mmol/L (ref 135–145)
Total Bilirubin: 0.9 mg/dL (ref 0.3–1.2)
Total Protein: 7 g/dL (ref 6.5–8.1)

## 2016-08-24 LAB — CBC WITH DIFFERENTIAL/PLATELET
BASOS PCT: 0 %
Basophils Absolute: 0 10*3/uL (ref 0–0.1)
EOS PCT: 1 %
Eosinophils Absolute: 0 10*3/uL (ref 0–0.7)
HCT: 39.1 % (ref 35.0–47.0)
Hemoglobin: 13.4 g/dL (ref 12.0–16.0)
Lymphocytes Relative: 13 %
Lymphs Abs: 1.1 10*3/uL (ref 1.0–3.6)
MCH: 31.6 pg (ref 26.0–34.0)
MCHC: 34.2 g/dL (ref 32.0–36.0)
MCV: 92.5 fL (ref 80.0–100.0)
Monocytes Absolute: 0.7 10*3/uL (ref 0.2–0.9)
Monocytes Relative: 8 %
Neutro Abs: 6.5 10*3/uL (ref 1.4–6.5)
Neutrophils Relative %: 78 %
PLATELETS: 168 10*3/uL (ref 150–440)
RBC: 4.23 MIL/uL (ref 3.80–5.20)
RDW: 12.8 % (ref 11.5–14.5)
WBC: 8.3 10*3/uL (ref 3.6–11.0)

## 2016-08-24 LAB — LIPASE, BLOOD: LIPASE: 21 U/L (ref 11–51)

## 2016-08-24 LAB — TROPONIN I

## 2016-08-24 MED ORDER — ASPIRIN 81 MG PO CHEW
324.0000 mg | CHEWABLE_TABLET | Freq: Once | ORAL | Status: DC
  Filled 2016-08-24: qty 4

## 2016-08-24 NOTE — ED Triage Notes (Signed)
Says chest pressure sin e yesterday.  Also sob, but says she has copd

## 2016-08-24 NOTE — ED Notes (Signed)
Pt took 375 mg of aspirin around 0700 before coming to the Ed. Dr notified and aspirin discontinued

## 2016-08-24 NOTE — ED Provider Notes (Signed)
[facility] Emergency Department Provider Note  ____________________________________________  Time seen: Approximately 9:02 AM  I have reviewed the triage vital signs and the nursing notes.   HISTORY  Chief Complaint Chest Pain    HPI Tina Bowman is a 70 y.o. female who complains of chest pain. It started about 9 PM last night and has been constant and then. Is described as pressure, radiating to the left neck. Also reports shortness of breath. No vomiting or diaphoresis. Not exertional, not pleuritic. No aggravating or alleviating factors. Moderate in intensity. Never had anything like this before. No recent illness.     Past Medical History:  Diagnosis Date  . Asthma   . COPD (chronic obstructive pulmonary disease) (HCC)   . Hypertension   COPD secondary to secondhand smoke exposure   There are no active problems to display for this patient.    Past Surgical History:  Procedure Laterality Date  . ABDOMINAL HYSTERECTOMY    . APPENDECTOMY    . BACK SURGERY    . BREAST BIOPSY Left   . CHOLECYSTECTOMY    . SHOULDER SURGERY       Prior to Admission medications   Medication Sig Start Date End Date Taking? Authorizing Provider  albuterol (PROVENTIL HFA;VENTOLIN HFA) 108 (90 Base) MCG/ACT inhaler Inhale 2 puffs into the lungs every 6 (six) hours as needed for wheezing or shortness of breath.   Yes Historical Provider, MD  aspirin 81 MG tablet Take 81 mg by mouth daily.   Yes Historical Provider, MD  atorvastatin (LIPITOR) 40 MG tablet Take 40 mg by mouth every evening.   Yes Historical Provider, MD  calcium-vitamin D (OSCAL WITH D) 500-200 MG-UNIT per tablet Take 1 tablet by mouth.   Yes Historical Provider, MD  enalapril (VASOTEC) 20 MG tablet Take 20 mg by mouth daily.   Yes Historical Provider, MD  Fluticasone-Salmeterol (ADVAIR) 250-50 MCG/DOSE AEPB Inhale 1 puff into the lungs 2 (two) times daily.   Yes Historical Provider, MD   hydrALAZINE (APRESOLINE) 25 MG tablet Take 25 mg by mouth 2 (two) times daily.   Yes Historical Provider, MD  levalbuterol (XOPENEX) 1.25 MG/3ML nebulizer solution Take 1.25 mg by nebulization every 4 (four) hours as needed for wheezing or shortness of breath.   Yes Historical Provider, MD  loratadine (CLARITIN) 10 MG tablet Take 10 mg by mouth daily.   Yes Historical Provider, MD  metoprolol (LOPRESSOR) 50 MG tablet Take 50 mg by mouth 2 (two) times daily.   Yes Historical Provider, MD  montelukast (SINGULAIR) 10 MG tablet Take 10 mg by mouth at bedtime.   Yes Historical Provider, MD  omeprazole (PRILOSEC) 40 MG capsule Take 40 mg by mouth daily.   Yes Historical Provider, MD  ranitidine (ZANTAC) 150 MG tablet Take 150 mg by mouth at bedtime.    Yes Historical Provider, MD  traMADol (ULTRAM) 50 MG tablet Take by mouth every 6 (six) hours as needed.   Yes Historical Provider, MD     Allergies Methadone; Niacin and related; Oxycontin [oxycodone]; Sulfa antibiotics; and Other   Family History  Problem Relation Age of Onset  . Breast cancer Cousin   . Breast cancer Maternal Aunt     Social History Social History  Substance Use Topics  . Smoking status: Never Smoker  . Smokeless tobacco: Never Used  . Alcohol use No    Review of Systems  Constitutional:   No fever or chills.  ENT:   No sore  throat. No rhinorrhea. Cardiovascular:   Positive as above chest pain. Respiratory:   Heart is a breath without cough. Gastrointestinal:   Negative for abdominal pain, vomiting and diarrhea.  10-point ROS otherwise negative.  ____________________________________________   PHYSICAL EXAM:  VITAL SIGNS: ED Triage Vitals  Enc Vitals Group     BP 08/24/16 0849 (!) 164/72     Pulse Rate 08/24/16 0849 73     Resp 08/24/16 0849 13     Temp 08/24/16 0849 98.3 F (36.8 C)     Temp Source 08/24/16 0849 Oral     SpO2 08/24/16 0849 98 %     Weight 08/24/16 0846 148 lb (67.1 kg)     Height  08/24/16 0846 5\' 2"  (1.575 m)     Head Circumference --      Peak Flow --      Pain Score 08/24/16 0846 5     Pain Loc --      Pain Edu? --      Excl. in GC? --     Vital signs reviewed, nursing assessments reviewed.   Constitutional:   Alert and oriented. Well appearing and in no distress. Eyes:   No scleral icterus. No conjunctival pallor. PERRL. EOMI.  No nystagmus. ENT   Head:   Normocephalic and atraumatic.   Nose:   No congestion/rhinnorhea. No septal hematoma   Mouth/Throat:   MMM, no pharyngeal erythema. No peritonsillar mass.    Neck:   No stridor. No SubQ emphysema. No meningismus. Hematological/Lymphatic/Immunilogical:   No cervical lymphadenopathy. Cardiovascular:   RRR. Symmetric bilateral radial and DP pulses.  No murmurs.  Respiratory:   Normal respiratory effort without tachypnea nor retractions. Breath sounds are clear and equal bilaterally. No wheezes/rales/rhonchi.Central chest tender to the touch over the lower sternum in the area of indicated pain Gastrointestinal:   Soft and nontender. Non distended. There is no CVA tenderness.  No rebound, rigidity, or guarding. Genitourinary:   deferred Musculoskeletal:   Nontender with normal range of motion in all extremities. No joint effusions.  No lower extremity tenderness.  No edema. Neurologic:   Normal speech and language.  CN 2-10 normal. Motor grossly intact. No gross focal neurologic deficits are appreciated.  Skin:    Skin is warm, dry and intact. No rash noted.  No petechiae, purpura, or bullae.  ____________________________________________    LABS (pertinent positives/negatives) (all labs ordered are listed, but only abnormal results are displayed) Labs Reviewed  COMPREHENSIVE METABOLIC PANEL - Abnormal; Notable for the following:       Result Value   Glucose, Bld 118 (*)    All other components within normal limits  LIPASE, BLOOD  TROPONIN I  CBC WITH DIFFERENTIAL/PLATELET    ____________________________________________   EKG  Interpreted by me  Date: 08/24/2016  Rate: 71  Rhythm: normal sinus rhythm  QRS Axis: normal  Intervals: normal  ST/T Wave abnormalities: normal  Conduction Disutrbances: none  Narrative Interpretation: unremarkable      ____________________________________________    RADIOLOGY  Chest x-ray unremarkable  ____________________________________________   PROCEDURES Procedures  ____________________________________________   INITIAL IMPRESSION / ASSESSMENT AND PLAN / ED COURSE  Pertinent labs & imaging results that were available during my care of the patient were reviewed by me and considered in my medical decision making (see chart for details).  Patient presents with atypical chest pain. Low suspicion for ACS PE dissection or carditis. We'll get a chest x-ray, check labs. With pain that has been constant for about  12 hours, negative troponin should be sufficient to evaluate for heart strain or damage. She sees Yves Dill for primary care and Adrian Blackwater for cardiology. Last stress test was a couple years ago, last heart catheterization many years ago. Last saw cardiology about 2 months ago. We'll plan for close cardiology follow-up if labs are unremarkable.   ----------------------------------------- 11:17 AM on 08/24/2016 -----------------------------------------  Labs unremarkable. Patient feeling fine. Discharged home, advised to follow-up with shaukat khan today to schedule an appointment.    Clinical Course   ____________________________________________   FINAL CLINICAL IMPRESSION(S) / ED DIAGNOSES  Final diagnoses:  Atypical chest pain       Portions of this note were generated with dragon dictation software. Dictation errors may occur despite best attempts at proofreading.    Sharman Cheek, MD 08/24/16 1118

## 2016-09-18 ENCOUNTER — Other Ambulatory Visit: Payer: Self-pay | Admitting: Obstetrics and Gynecology

## 2016-09-18 DIAGNOSIS — Z1231 Encounter for screening mammogram for malignant neoplasm of breast: Secondary | ICD-10-CM

## 2016-10-25 ENCOUNTER — Ambulatory Visit
Admission: RE | Admit: 2016-10-25 | Discharge: 2016-10-25 | Disposition: A | Discharge status: Home / Self Care | Payer: Medicare HMO | Source: Ambulatory Visit | Attending: Obstetrics and Gynecology | Admitting: Obstetrics and Gynecology

## 2016-10-25 DIAGNOSIS — Z1231 Encounter for screening mammogram for malignant neoplasm of breast: Secondary | ICD-10-CM | POA: Diagnosis not present

## 2017-06-21 ENCOUNTER — Ambulatory Visit (INDEPENDENT_AMBULATORY_CARE_PROVIDER_SITE_OTHER): Payer: Medicare HMO | Admitting: Neurology

## 2017-06-21 ENCOUNTER — Encounter: Payer: Self-pay | Admitting: Neurology

## 2017-06-21 DIAGNOSIS — R410 Disorientation, unspecified: Secondary | ICD-10-CM

## 2017-06-21 DIAGNOSIS — R413 Other amnesia: Secondary | ICD-10-CM | POA: Diagnosis not present

## 2017-06-21 NOTE — Progress Notes (Signed)
PATIENT: MERCIA Bowman DOB: 05-20-1946  Chief Complaint  Patient presents with  . Foggy Minded    She is here with her husband, Tina Bowman. Reports intermittent episodes of feeling foggy minded over the last year.  Feels this "fog" makes her nauseated and her legs feel weak.  Denies any issues with memory but says her thinking is slower with these events.  . Aphasia    She feels her slow, slurred speech has progressively worsened since June 2018.  Marland Kitchen PCP    Dr. Margaretann Loveless, MD or Orson Eva, NP (same practice)     HISTORICAL  Tina Bowman is a 71 years old right-handed female, accompanied by her husband Tina Bowman, seen in refer by her primary care physician Dr.  Welton Flakes, Ezekiel Slocumb S for evaluation of dizziness, nausea, weakness in her leg, slow thinking, initial evaluation was June 21 2017  I reviewed and summarized the referring note, she has history of hyperlipidemia, coronary artery disease, osteoarthritis,  She retired now, lives with her husband at home, independent of daily activity, denies family history of central nervous system degenerative disorder, she is a poor historian, she reported since 2017, she felt  "foggy headed", on further questioning, she denied memory loss, denied depression, denies headaches, denied dizziness, she could not elaborate on detail of her symptoms,  She did have a history of obstructive sleep apnea, using CPAP machine, she reported overall good sleep quality, good appetite, she did have lost interest in some activity she used to enjoy, but still able to be active at her daily household chore.  CT head without contrast showed no significant abnormality  Laboratory evaluations November 2017, normal CBC, CMP, with reevaluation showed elevated free T4, decreased TSH,  Ultrasound of thyroid: Heterogeneous thyroid gland which is enlarged, cystic and hypoechoic echogenic nodule throughout the thyroid gland,   REVIEW OF SYSTEMS: Full 14 system review  of systems performed and notable only for slurred speech, shortness of breath, wheezing,  ALLERGIES: Allergies  Allergen Reactions  . Methadone Other (See Comments)    Pt doesn't know  . Niacin And Related Other (See Comments)    Gives her hot flashes  . Oxycontin [Oxycodone] Other (See Comments)    Makes her feel strange.  . Sulfa Antibiotics Other (See Comments)    Pt doesn't know  . Other Rash    Band aids (different brands) causes pt to break out in a rash.     HOME MEDICATIONS: Current Outpatient Prescriptions  Medication Sig Dispense Refill  . albuterol (PROVENTIL HFA;VENTOLIN HFA) 108 (90 Base) MCG/ACT inhaler Inhale 2 puffs into the lungs every 6 (six) hours as needed for wheezing or shortness of breath.    Marland Kitchen aspirin 325 MG tablet Take 325 mg by mouth daily.    Marland Kitchen atorvastatin (LIPITOR) 40 MG tablet Take 40 mg by mouth every evening.    . calcium-vitamin D (OSCAL WITH D) 500-200 MG-UNIT per tablet Take 1 tablet by mouth.    . enalapril (VASOTEC) 20 MG tablet Take 20 mg by mouth daily.    . hydrALAZINE (APRESOLINE) 25 MG tablet Take 25 mg by mouth 2 (two) times daily.    Marland Kitchen levalbuterol (XOPENEX) 1.25 MG/3ML nebulizer solution Take 1.25 mg by nebulization every 4 (four) hours as needed for wheezing or shortness of breath.    . loratadine (CLARITIN) 10 MG tablet Take 10 mg by mouth daily.    . metoprolol (LOPRESSOR) 50 MG tablet Take 50 mg by mouth 2 (  two) times daily.    . montelukast (SINGULAIR) 10 MG tablet Take 10 mg by mouth at bedtime.    Marland Kitchen. omeprazole (PRILOSEC) 40 MG capsule Take 40 mg by mouth daily.    . ranitidine (ZANTAC) 150 MG tablet Take 150 mg by mouth at bedtime.     . traMADol (ULTRAM) 50 MG tablet Take by mouth every 6 (six) hours as needed.    . umeclidinium bromide (INCRUSE ELLIPTA) 62.5 MCG/INH AEPB Inhale 1 puff into the lungs daily.     No current facility-administered medications for this visit.     PAST MEDICAL HISTORY: Past Medical History:    Diagnosis Date  . Asthma   . COPD (chronic obstructive pulmonary disease) (HCC)   . Fibromyalgia   . Heart disease   . Hypertension   . Slurred speech    foggy minded    PAST SURGICAL HISTORY: Past Surgical History:  Procedure Laterality Date  . ABDOMINAL HYSTERECTOMY    . APPENDECTOMY    . BACK SURGERY    . BREAST BIOPSY Left    2000's  . CATARACT EXTRACTION     Also, retina surgery  . CHOLECYSTECTOMY    . SHOULDER SURGERY      FAMILY HISTORY: Family History  Problem Relation Age of Onset  . Heart attack Mother   . Heart disease Father   . Breast cancer Cousin   . Breast cancer Maternal Aunt   . Diabetes Maternal Grandfather     SOCIAL HISTORY:  Social History   Social History  . Marital status: Married    Spouse name: N/A  . Number of children: 3  . Years of education: HS   Occupational History  . Retired    Social History Main Topics  . Smoking status: Never Smoker  . Smokeless tobacco: Never Used  . Alcohol use Yes     Comment: social  . Drug use: No  . Sexual activity: Not on file   Other Topics Concern  . Not on file   Social History Narrative   Lives at home with her husband.   Right-handed.   No caffeine use.     PHYSICAL EXAM   Vitals:   06/21/17 1403  BP: 134/75  Pulse: 63  Weight: 152 lb 12 oz (69.3 kg)  Height: 5\' 3"  (1.6 m)    Not recorded      Body mass index is 27.06 kg/m.  PHYSICAL EXAMNIATION:  Gen: NAD, conversant, well nourised, obese, well groomed                     Cardiovascular: Regular rate rhythm, no peripheral edema, warm, nontender. Eyes: Conjunctivae clear without exudates or hemorrhage Neck: Supple, no carotid bruits. Pulmonary: Clear to auscultation bilaterally   NEUROLOGICAL EXAM:  MENTAL STATUS: Speech:    Speech is normal; fluent and spontaneous with normal comprehension.  Cognition:     Orientation to time, place and person     Normal recent and remote memory     Normal Attention span  and concentration     Normal Language, naming, repeating,spontaneous speech     Fund of knowledge   CRANIAL NERVES: CN II: Visual fields are full to confrontation. Fundoscopic exam is normal with sharp discs and no vascular changes. Pupils are round equal and briskly reactive to light. CN III, IV, VI: extraocular movement are normal. No ptosis. CN V: Facial sensation is intact to pinprick in all 3 divisions bilaterally. Corneal responses are  intact.  CN VII: Face is symmetric with normal eye closure and smile. CN VIII: Hearing is normal to rubbing fingers CN IX, X: Palate elevates symmetrically. Phonation is normal. CN XI: Head turning and shoulder shrug are intact CN XII: Tongue is midline with normal movements and no atrophy.  MOTOR: There is no pronator drift of out-stretched arms. Muscle bulk and tone are normal. Muscle strength is normal.  REFLEXES: Reflexes are 2+ and symmetric at the biceps, triceps, knees, and ankles. Plantar responses are flexor.  SENSORY: Intact to light touch, pinprick, positional sensation and vibratory sensation are intact in fingers and toes.  COORDINATION: Rapid alternating movements and fine finger movements are intact. There is no dysmetria on finger-to-nose and heel-knee-shin.    GAIT/STANCE: Posture is normal. Gait is steady with normal steps, base, arm swing, and turning. Heel and toe walking are normal. Tandem gait is normal.  Romberg is absent.   DIAGNOSTIC DATA (LABS, IMAGING, TESTING) - I reviewed patient records, labs, notes, testing and imaging myself where available.   ASSESSMENT AND PLAN  Tina Bowman is a 71 y.o. female   Mild confusion  Proceed with MRI of the brain  Laboratory evaluations    Levert Feinstein, M.D. Ph.D.  [facility] [address], [address] [place], [place] [address] Ph: [phone] Fax: [phone]  CC: Margaretann Loveless, MD

## 2017-06-22 LAB — COMPREHENSIVE METABOLIC PANEL
A/G RATIO: 2 (ref 1.2–2.2)
ALBUMIN: 4.5 g/dL (ref 3.5–4.8)
ALT: 22 IU/L (ref 0–32)
AST: 23 IU/L (ref 0–40)
Alkaline Phosphatase: 110 IU/L (ref 39–117)
BILIRUBIN TOTAL: 0.3 mg/dL (ref 0.0–1.2)
BUN / CREAT RATIO: 20 (ref 12–28)
BUN: 16 mg/dL (ref 8–27)
CHLORIDE: 102 mmol/L (ref 96–106)
CO2: 26 mmol/L (ref 20–29)
Calcium: 9.4 mg/dL (ref 8.7–10.3)
Creatinine, Ser: 0.82 mg/dL (ref 0.57–1.00)
GFR calc non Af Amer: 72 mL/min/{1.73_m2} (ref >59)
GFR, EST AFRICAN AMERICAN: 83 mL/min/{1.73_m2} (ref >59)
GLUCOSE: 103 mg/dL — AB (ref 65–99)
Globulin, Total: 2.3 g/dL (ref 1.5–4.5)
Potassium: 4.1 mmol/L (ref 3.5–5.2)
Sodium: 141 mmol/L (ref 134–144)
TOTAL PROTEIN: 6.8 g/dL (ref 6.0–8.5)

## 2017-06-22 LAB — SYPHILIS: RPR W/REFLEX TO RPR TITER AND TREPONEMAL ANTIBODIES, TRADITIONAL SCREENING AND DIAGNOSIS ALGORITHM: RPR Ser Ql: NONREACTIVE

## 2017-06-22 LAB — CBC WITH DIFFERENTIAL/PLATELET
BASOS: 0 %
Basophils Absolute: 0 10*3/uL (ref 0.0–0.2)
EOS (ABSOLUTE): 0.1 10*3/uL (ref 0.0–0.4)
EOS: 1 %
HEMATOCRIT: 38.8 % (ref 34.0–46.6)
HEMOGLOBIN: 12.6 g/dL (ref 11.1–15.9)
Immature Grans (Abs): 0 10*3/uL (ref 0.0–0.1)
Immature Granulocytes: 0 %
LYMPHS ABS: 2.2 10*3/uL (ref 0.7–3.1)
Lymphs: 32 %
MCH: 30.7 pg (ref 26.6–33.0)
MCHC: 32.5 g/dL (ref 31.5–35.7)
MCV: 94 fL (ref 79–97)
MONOCYTES: 9 %
Monocytes Absolute: 0.6 10*3/uL (ref 0.1–0.9)
NEUTROS ABS: 4.1 10*3/uL (ref 1.4–7.0)
Neutrophils: 58 %
Platelets: 226 10*3/uL (ref 150–379)
RBC: 4.11 x10E6/uL (ref 3.77–5.28)
RDW: 12.9 % (ref 12.3–15.4)
WBC: 7 10*3/uL (ref 3.4–10.8)

## 2017-06-22 LAB — VITAMIN B12: Vitamin B-12: 647 pg/mL (ref 232–1245)

## 2017-06-22 LAB — FOLATE: Folate: >20 ng/mL (ref >3.0)

## 2017-06-22 LAB — C-REACTIVE PROTEIN: CRP: 0.3 mg/L (ref 0.0–4.9)

## 2017-06-22 LAB — CK: Total CK: 62 U/L (ref 24–173)

## 2017-06-22 LAB — ANA W/REFLEX: Anti Nuclear Antibody(ANA): NEGATIVE

## 2017-06-22 LAB — SEDIMENTATION RATE: Sed Rate: 2 mm/hr (ref 0–40)

## 2017-06-22 LAB — VITAMIN D 25 HYDROXY (VIT D DEFICIENCY, FRACTURES): Vit D, 25-Hydroxy: 46.7 ng/mL (ref 30.0–100.0)

## 2017-07-04 ENCOUNTER — Telehealth: Payer: Self-pay | Admitting: Neurology

## 2017-07-04 NOTE — Telephone Encounter (Signed)
Fayrene FearingJames H/[facility] [phone] case# [id number] called in MRI/PA pending denial and would like to offer provider P2P.

## 2017-07-05 NOTE — Telephone Encounter (Signed)
Peer-to-peer scheduled for 3:45pm today.  Dr. Graylin ShiverJennifer Schutter will be calling back on Dr. Zannie CoveYan's mobile number to complete the case.

## 2017-07-05 NOTE — Telephone Encounter (Signed)
MRI brain preauthorize, exp Dec 6 th 2018.  [id number]

## 2017-07-05 NOTE — Telephone Encounter (Signed)
Noted, thank you

## 2017-07-06 ENCOUNTER — Other Ambulatory Visit: Payer: Medicare HMO

## 2017-09-08 ENCOUNTER — Ambulatory Visit
Admission: RE | Admit: 2017-09-08 | Discharge: 2017-09-08 | Disposition: A | Discharge status: Home / Self Care | Payer: Medicare HMO | Source: Ambulatory Visit | Attending: Neurology | Admitting: Neurology

## 2017-09-08 DIAGNOSIS — R413 Other amnesia: Secondary | ICD-10-CM

## 2017-09-10 ENCOUNTER — Telehealth: Payer: Self-pay | Admitting: Neurology

## 2017-09-10 NOTE — Telephone Encounter (Signed)
Noted, left a voicemail with Dr. Welton FlakesKhan nurse informing her that I need information about the patients pain pump.

## 2017-09-10 NOTE — Telephone Encounter (Signed)
Arena with [facility]'s [facility]. Please call to discuss.

## 2017-09-10 NOTE — Telephone Encounter (Signed)
Before I can schedule the MRI at [facility] the MRI tech's need to know what kind of pain pump it is. Can it be unplug? And does the pain pump have a cerial number make and model?

## 2017-09-10 NOTE — Telephone Encounter (Signed)
Patient has pain pump and will need to be scheduled at hospital for MRI brain wo.  She would like to have it done at [facility].

## 2017-09-11 ENCOUNTER — Other Ambulatory Visit: Payer: Self-pay | Admitting: Student

## 2017-09-11 DIAGNOSIS — R1013 Epigastric pain: Secondary | ICD-10-CM

## 2017-09-11 DIAGNOSIS — R1032 Left lower quadrant pain: Principal | ICD-10-CM

## 2017-09-11 DIAGNOSIS — R1031 Right lower quadrant pain: Secondary | ICD-10-CM

## 2017-09-11 NOTE — Telephone Encounter (Signed)
Patient is schedule to have MRI done at [facility] for Wednesday 09/19/17 arrival time is 8:30 AM patient is aware of time and day. She state's she will get her Pain pump checked at the [facility] after the MRI.

## 2017-09-12 ENCOUNTER — Encounter: Payer: Self-pay | Admitting: Emergency Medicine

## 2017-09-12 ENCOUNTER — Emergency Department: Payer: Medicare HMO

## 2017-09-12 ENCOUNTER — Emergency Department
Admission: EM | Admit: 2017-09-12 | Discharge: 2017-09-12 | Disposition: A | Discharge status: Home / Self Care | Payer: Medicare HMO | Attending: Emergency Medicine | Admitting: Emergency Medicine

## 2017-09-12 ENCOUNTER — Ambulatory Visit
Admission: RE | Admit: 2017-09-12 | Discharge: 2017-09-12 | Disposition: A | Discharge status: Home / Self Care | Payer: Medicare HMO | Source: Ambulatory Visit | Attending: Student | Admitting: Student

## 2017-09-12 ENCOUNTER — Other Ambulatory Visit: Payer: Self-pay

## 2017-09-12 DIAGNOSIS — J45909 Unspecified asthma, uncomplicated: Secondary | ICD-10-CM | POA: Insufficient documentation

## 2017-09-12 DIAGNOSIS — J449 Chronic obstructive pulmonary disease, unspecified: Secondary | ICD-10-CM | POA: Diagnosis not present

## 2017-09-12 DIAGNOSIS — K5730 Diverticulosis of large intestine without perforation or abscess without bleeding: Secondary | ICD-10-CM | POA: Insufficient documentation

## 2017-09-12 DIAGNOSIS — Z7982 Long term (current) use of aspirin: Secondary | ICD-10-CM | POA: Insufficient documentation

## 2017-09-12 DIAGNOSIS — R1032 Left lower quadrant pain: Principal | ICD-10-CM

## 2017-09-12 DIAGNOSIS — Z79899 Other long term (current) drug therapy: Secondary | ICD-10-CM | POA: Insufficient documentation

## 2017-09-12 DIAGNOSIS — I700 Atherosclerosis of aorta: Secondary | ICD-10-CM | POA: Insufficient documentation

## 2017-09-12 DIAGNOSIS — R1013 Epigastric pain: Secondary | ICD-10-CM | POA: Insufficient documentation

## 2017-09-12 DIAGNOSIS — R079 Chest pain, unspecified: Secondary | ICD-10-CM | POA: Diagnosis not present

## 2017-09-12 DIAGNOSIS — R1031 Right lower quadrant pain: Secondary | ICD-10-CM

## 2017-09-12 DIAGNOSIS — I10 Essential (primary) hypertension: Secondary | ICD-10-CM | POA: Insufficient documentation

## 2017-09-12 LAB — TROPONIN I

## 2017-09-12 LAB — CBC
HCT: 38.4 % (ref 35.0–47.0)
HEMOGLOBIN: 13 g/dL (ref 12.0–16.0)
MCH: 32 pg (ref 26.0–34.0)
MCHC: 33.9 g/dL (ref 32.0–36.0)
MCV: 94.3 fL (ref 80.0–100.0)
Platelets: 173 10*3/uL (ref 150–440)
RBC: 4.08 MIL/uL (ref 3.80–5.20)
RDW: 12.4 % (ref 11.5–14.5)
WBC: 5.7 10*3/uL (ref 3.6–11.0)

## 2017-09-12 LAB — BASIC METABOLIC PANEL
ANION GAP: 12 (ref 5–15)
BUN: 17 mg/dL (ref 6–20)
CALCIUM: 9 mg/dL (ref 8.9–10.3)
CHLORIDE: 97 mmol/L — AB (ref 101–111)
CO2: 27 mmol/L (ref 22–32)
Creatinine, Ser: 0.79 mg/dL (ref 0.44–1.00)
GFR calc non Af Amer: >60 mL/min (ref >60)
Glucose, Bld: 117 mg/dL — ABNORMAL HIGH (ref 65–99)
Potassium: 2.9 mmol/L — ABNORMAL LOW (ref 3.5–5.1)
Sodium: 136 mmol/L (ref 135–145)

## 2017-09-12 LAB — FIBRIN DERIVATIVES D-DIMER (ARMC ONLY): Fibrin derivatives D-dimer (ARMC): 431.61 ng/mL (FEU) (ref 0.00–499.00)

## 2017-09-12 MED ORDER — MORPHINE SULFATE (PF) 2 MG/ML IV SOLN
2.0000 mg | Freq: Once | INTRAVENOUS | Status: AC
  Administered 2017-09-12: 2 mg via INTRAVENOUS
  Filled 2017-09-12 (×2): qty 1

## 2017-09-12 MED ORDER — POTASSIUM CHLORIDE 20 MEQ PO PACK
40.0000 meq | PACK | Freq: Once | ORAL | Status: AC
  Administered 2017-09-12: 40 meq via ORAL
  Filled 2017-09-12: qty 2

## 2017-09-12 MED ORDER — IOPAMIDOL (ISOVUE-300) INJECTION 61%: 30.0000 mL | Freq: Once | INTRAVENOUS | Status: DC | PRN

## 2017-09-12 MED ORDER — ASPIRIN 81 MG PO CHEW
324.0000 mg | CHEWABLE_TABLET | Freq: Once | ORAL | Status: AC
  Administered 2017-09-12: 324 mg via ORAL
  Filled 2017-09-12: qty 4

## 2017-09-12 MED ORDER — IOPAMIDOL (ISOVUE-300) INJECTION 61%
100.0000 mL | Freq: Once | INTRAVENOUS | Status: AC | PRN
  Administered 2017-09-12: 100 mL via INTRAVENOUS

## 2017-09-12 NOTE — ED Notes (Signed)
Pt discharged home after verbalizing understanding of discharge instructions; nad noted.   Pt to be taken to outpatient radiology for existing appointment

## 2017-09-12 NOTE — Discharge Instructions (Signed)

## 2017-09-12 NOTE — ED Notes (Signed)
Patient to ED with complaints of pinpoint chest pain over left chest. States it woke her up last night. Has CT of abdomen scheduled for this morning. Has been taking Carafate and reports darker stools than normal. Patient denies SHOB, N/V or diaphoresis. No exertional SHOB. Skin warm/dry, skin color normal for ethnicity. Mucus membranes moist. Able to speak in complete sentences.

## 2017-09-12 NOTE — ED Triage Notes (Addendum)
Patient ambulatory to triage with steady gait, without difficulty or distress noted; pt repors mid CP, nonradiating accomp by dizziness and HA since 10pm; pt reports seen by her PCP yesterday for abd pain and is sched for CT scan today; was rx carafate; also having black stools since yesterday

## 2017-09-12 NOTE — ED Provider Notes (Signed)
[facility] Emergency Department Provider Note   First MD Initiated Contact with Patient 09/12/17 0501     (approximate)  I have reviewed the triage vital signs and the nursing notes.   HISTORY  Chief Complaint Chest Pain    HPI Tina Bowman is a 71 y.o. female below list of chronic medical conditions presents to the emergency department with acute onset of nonradiating left-sided chest pain which began at 10 PM last night.  Patient states that the pain has been persistent since onset.  Patient denies any dyspnea no nausea or vomiting.  Patient denies any palpitations no dizziness.  Patient denies any personal history of heart disease.  Patient states her current pain score is 9 out of 10.  Patient denies any aggravating or alleviating factor.  Patient denies any lower extremity pain or swelling   Past Medical History:  Diagnosis Date  . Asthma   . COPD (chronic obstructive pulmonary disease) (HCC)   . Fibromyalgia   . Heart disease   . Hypertension   . Slurred speech    foggy minded    Patient Active Problem List   Diagnosis Date Noted  . Confusion 06/21/2017    Past Surgical History:  Procedure Laterality Date  . ABDOMINAL HYSTERECTOMY    . APPENDECTOMY    . BACK SURGERY    . BREAST BIOPSY Left    2000's  . CATARACT EXTRACTION     Also, retina surgery  . CHOLECYSTECTOMY    . SHOULDER SURGERY      Prior to Admission medications   Medication Sig Start Date End Date Taking? Authorizing Provider  albuterol (PROVENTIL HFA;VENTOLIN HFA) 108 (90 Base) MCG/ACT inhaler Inhale 2 puffs into the lungs every 6 (six) hours as needed for wheezing or shortness of breath.    [provider]  aspirin 325 MG tablet Take 325 mg by mouth daily.    [provider]  atorvastatin (LIPITOR) 40 MG tablet Take 40 mg by mouth every evening.    [provider]  calcium-vitamin D (OSCAL WITH D) 500-200 MG-UNIT per tablet Take 1 tablet  by mouth.    [provider]  enalapril (VASOTEC) 20 MG tablet Take 20 mg by mouth daily.    [provider]  hydrALAZINE (APRESOLINE) 25 MG tablet Take 25 mg by mouth 2 (two) times daily.    [provider]  levalbuterol Pauline Aus(XOPENEX) 1.25 MG/3ML nebulizer solution Take 1.25 mg by nebulization every 4 (four) hours as needed for wheezing or shortness of breath.    [provider]  loratadine (CLARITIN) 10 MG tablet Take 10 mg by mouth daily.    [provider]  metoprolol (LOPRESSOR) 50 MG tablet Take 50 mg by mouth 2 (two) times daily.    [provider]  montelukast (SINGULAIR) 10 MG tablet Take 10 mg by mouth at bedtime.    [provider]  omeprazole (PRILOSEC) 40 MG capsule Take 40 mg by mouth daily.    [provider]  ranitidine (ZANTAC) 150 MG tablet Take 150 mg by mouth at bedtime.     [provider]  traMADol (ULTRAM) 50 MG tablet Take by mouth every 6 (six) hours as needed.    [provider]  umeclidinium bromide (INCRUSE ELLIPTA) 62.5 MCG/INH AEPB Inhale 1 puff into the lungs daily.    [provider]    Allergies Methadone; Niacin and related; Oxycontin [oxycodone]; Sulfa antibiotics; and Other  Family History  Problem  Relation Age of Onset  . Heart attack Mother   . Heart disease Father   . Breast cancer Cousin   . Breast cancer Maternal Aunt   . Diabetes Maternal Grandfather     Social History Social History   Tobacco Use  . Smoking status: Never Smoker  . Smokeless tobacco: Never Used  Substance Use Topics  . Alcohol use: Yes    Comment: social  . Drug use: No    Review of Systems Constitutional: No fever/chills Eyes: No visual changes. ENT: No sore throat. Cardiovascular: Positive for chest pain. Respiratory: Denies shortness of breath. Gastrointestinal: No abdominal pain.  No nausea, no vomiting.  No diarrhea.  No constipation. Genitourinary: Negative for  dysuria. Musculoskeletal: Negative for neck pain.  Negative for back pain. Integumentary: Negative for rash. Neurological: Negative for headaches, focal weakness or numbness.   ____________________________________________   PHYSICAL EXAM:  VITAL SIGNS: ED Triage Vitals  Enc Vitals Group     BP 09/12/17 0452 (!) 119/59     Pulse Rate 09/12/17 0452 84     Resp 09/12/17 0452 18     Temp 09/12/17 0452 98.3 F (36.8 C)     Temp Source 09/12/17 0452 Oral     SpO2 09/12/17 0452 97 %     Weight 09/12/17 0449 68 kg (150 lb)     Height 09/12/17 0449 1.575 m (5\' 2" )     Head Circumference --      Peak Flow --      Pain Score 09/12/17 0449 9     Pain Loc --      Pain Edu? --      Excl. in GC? --     Constitutional: Alert and oriented. Well appearing and in no acute distress. Eyes: Conjunctivae are normal. Head: Atraumatic. Mouth/Throat: Mucous membranes are moist.  Oropharynx non-erythematous. Neck: No stridor.   Cardiovascular: Normal rate, regular rhythm. Good peripheral circulation. Grossly normal heart sounds. Respiratory: Normal respiratory effort.  No retractions. Lungs CTAB. Gastrointestinal: Soft and nontender. No distention.  Musculoskeletal: No lower extremity tenderness nor edema. No gross deformities of extremities. Neurologic:  Normal speech and language. No gross focal neurologic deficits are appreciated.  Skin:  Skin is warm, dry and intact. No rash noted. Psychiatric: Mood and affect are normal. Speech and behavior are normal.  ____________________________________________   LABS (all labs ordered are listed, but only abnormal results are displayed)  Labs Reviewed  BASIC METABOLIC PANEL - Abnormal; Notable for the following components:      Result Value   Potassium 2.9 (*)    Chloride 97 (*)    Glucose, Bld 117 (*)    All other components within normal limits  CBC  TROPONIN I  FIBRIN DERIVATIVES D-DIMER ([facility] ONLY)    ____________________________________________  EKG  ED ECG REPORT I, [place] N BROWN, the attending physician, personally viewed and interpreted this ECG.   Date: 09/12/2017  EKG Time: 4:50 AM  Rate: 88  Rhythm: Normal sinus rhythm with premature ventricular contraction  Axis: Normal  Intervals: Normal  ST&T Change: None  ____________________________________________  RADIOLOGY I, [place] Dewayne Shorter, personally viewed and evaluated these images (plain radiographs) as part of my medical decision making, as well as reviewing the written report by the radiologist.  Dg Chest Port 1 View  Result Date: 09/12/2017 CLINICAL DATA:  Chest pain since last night, central and to the left. Headache, dizziness, nausea. Nonsmoker. EXAM: PORTABLE CHEST 1 VIEW COMPARISON:  08/24/2016 FINDINGS: Pulmonary hyperinflation. Normal  heart size and pulmonary vascularity. No focal airspace disease or consolidation in the lungs. No blunting of costophrenic angles. No pneumothorax. Mediastinal contours appear intact. Postoperative changes in the right clavicle. IMPRESSION: Pulmonary hyperinflation.  No evidence of active pulmonary disease. Electronically Signed   By: Burman NievesWilliam  Stevens M.D.   On: 09/12/2017 05:27      Procedures   ____________________________________________   INITIAL IMPRESSION / ASSESSMENT AND PLAN / ED COURSE  As part of my medical decision making, I reviewed the following data within the electronic MEDICAL RECORD [id number] year old female presents to the emergency department above-stated history and physical exam EKG revealed no evidence of ST segment elevation or laboratory data including troponin x1-.  Likewise d-dimer also negative.  Plan to obtain a second troponin and if negative recommend outpatient follow-up with cardiology.  Patient's care transferred to Dr. Don PerkingVeronese ____________________________________________  FINAL CLINICAL IMPRESSION(S) / ED DIAGNOSES  Final diagnoses:  Chest  pain, unspecified type     MEDICATIONS GIVEN DURING THIS VISIT:  Medications  aspirin chewable tablet 324 mg (324 mg Oral Given 09/12/17 0540)     ED Discharge Orders    None       Note:  This document was prepared using Dragon voice recognition software and may include unintentional dictation errors.    Darci CurrentBrown, [place] N, MD 09/13/17 [id number]

## 2017-09-12 NOTE — ED Provider Notes (Signed)
-----------------------------------------   8:55 AM on 09/12/2017 -----------------------------------------   Blood pressure (!) 119/59, pulse 84, temperature 98.3 F (36.8 C), temperature source Oral, resp. rate 18, height 5\' 2"  (1.575 m), weight 68 kg (150 lb), SpO2 97 %.  Assuming care from Dr. Manson PasseyBrown of Lorenza ChickDiane N Rud is a 71 y.o. female with a chief complaint of Chest Pain .    Please refer to H&P by previous MD for further details.  The current plan of care is to f/u repeat troponin and reassess.     _________________________ 8:55 AM on 09/12/2017 ----------------------------------------- Troponin x 2 negative. CXR and d-dimer negative. Patient remained stable with no further complaints in the ED. Has appointment to undergo outpatient CT a/p ordered by PCP at 9AM here, will dc so she can be seen by Radiology. Discussed standard return precautions and close f/u     Nita SickleVeronese, [place], MD 09/12/17 1536

## 2017-09-19 ENCOUNTER — Ambulatory Visit
Admission: RE | Admit: 2017-09-19 | Discharge: 2017-09-19 | Disposition: A | Discharge status: Home / Self Care | Payer: Medicare HMO | Source: Ambulatory Visit | Attending: Neurology | Admitting: Neurology

## 2017-09-19 ENCOUNTER — Telehealth: Payer: Self-pay | Admitting: *Deleted

## 2017-09-19 ENCOUNTER — Other Ambulatory Visit: Payer: Self-pay | Admitting: Neurology

## 2017-09-19 DIAGNOSIS — R413 Other amnesia: Secondary | ICD-10-CM | POA: Diagnosis not present

## 2017-09-19 DIAGNOSIS — R9089 Other abnormal findings on diagnostic imaging of central nervous system: Secondary | ICD-10-CM | POA: Diagnosis not present

## 2017-09-19 NOTE — Telephone Encounter (Signed)
-----   Message from Levert FeinsteinYijun Yan, MD sent at 09/19/2017  1:13 PM EST ----- Please call pt for normal MRI brain.

## 2017-09-19 NOTE — Telephone Encounter (Signed)
Spoke to patient she is aware of results

## 2017-09-24 ENCOUNTER — Ambulatory Visit: Payer: Medicare HMO | Admitting: Neurology

## 2017-10-09 ENCOUNTER — Other Ambulatory Visit: Payer: Self-pay | Admitting: Obstetrics and Gynecology

## 2017-10-09 DIAGNOSIS — Z1231 Encounter for screening mammogram for malignant neoplasm of breast: Secondary | ICD-10-CM

## 2017-11-07 ENCOUNTER — Ambulatory Visit
Admission: RE | Admit: 2017-11-07 | Discharge: 2017-11-07 | Disposition: A | Discharge status: Home / Self Care | Payer: Medicare HMO | Source: Ambulatory Visit | Attending: Obstetrics and Gynecology | Admitting: Obstetrics and Gynecology

## 2017-11-07 DIAGNOSIS — Z1231 Encounter for screening mammogram for malignant neoplasm of breast: Secondary | ICD-10-CM | POA: Diagnosis not present

## 2017-11-22 ENCOUNTER — Ambulatory Visit: Payer: Medicare HMO | Admitting: Neurology

## 2018-04-03 ENCOUNTER — Other Ambulatory Visit: Payer: Self-pay | Admitting: Neurology

## 2018-04-03 DIAGNOSIS — R1312 Dysphagia, oropharyngeal phase: Secondary | ICD-10-CM

## 2018-05-01 ENCOUNTER — Ambulatory Visit
Admission: RE | Admit: 2018-05-01 | Discharge: 2018-05-01 | Disposition: A | Discharge status: Home / Self Care | Payer: Medicare HMO | Source: Ambulatory Visit | Attending: Neurology | Admitting: Neurology

## 2018-05-01 DIAGNOSIS — R1312 Dysphagia, oropharyngeal phase: Secondary | ICD-10-CM | POA: Diagnosis not present

## 2018-05-01 DIAGNOSIS — R1310 Dysphagia, unspecified: Secondary | ICD-10-CM

## 2018-05-01 NOTE — Therapy (Signed)
[facility] DIAGNOSTIC RADIOLOGY [address] [place], [place], [address] Phone: [phone]   Fax:     Modified Barium Swallow  Patient Details  Name: Tina Bowman MRN: [id number] Date of Birth: May 15, 1946 No data recorded  Encounter Date: 05/01/2018  End of Session - 05/01/18 1311    Visit Number  1    Number of Visits  1    Date for SLP Re-Evaluation  05/01/18    SLP Start Time  1245    SLP Stop Time   1305    SLP Time Calculation (min)  20 min    Activity Tolerance  Patient tolerated treatment well       Past Medical History:  Diagnosis Date  . Asthma   . COPD (chronic obstructive pulmonary disease) (HCC)   . Fibromyalgia   . Heart disease   . Hypertension   . Slurred speech    foggy minded    Past Surgical History:  Procedure Laterality Date  . ABDOMINAL HYSTERECTOMY    . APPENDECTOMY    . BACK SURGERY    . BREAST EXCISIONAL BIOPSY Left    2000's  . CATARACT EXTRACTION     Also, retina surgery  . CHOLECYSTECTOMY    . SHOULDER SURGERY      There were no vitals filed for this visit.  Subjective Assessment - 05/01/18 1303    Currently in Pain?  No/denies          General - 05/01/18 1304      General Information   Date of Onset  05/01/18    HPI  Pt is referred by neurologist, Dr. Sherryll BurgerShah, due to difficulty with eating both solids and liquids. Pt was recently evaluated by neurology for having lightheadedness and mental fog since before 2017. Pt complains of difficulty with her speech as well, which she decribes as difficulty coming up with what she wants to say. Pt began Senement following neurology visit which she reports has helped some of her symptoms but not speech. Pt reports she is awaiting test results to see if she has Parkinson's.     Type of Study  MBS-Modified Barium Swallow Study    Previous Swallow Assessment  None    Diet Prior to this Study  Regular;Thin liquids    Temperature Spikes Noted  N/A    Respiratory Status  Room air    History of Recent Intubation  No    Behavior/Cognition  Alert;Cooperative;Pleasant mood    Oral Cavity Assessment  Within Functional Limits    Oral Care Completed by SLP  No    Oral Cavity - Dentition  Adequate natural dentition    Vision  Functional for self feeding    Patient Positioning  Upright in chair    Baseline Vocal Quality  Low vocal intensity    Anatomy  Within functional limits        Subjective: Patient behavior: (alertness, ability to follow instructions, etc.): Pt was alert and cooperative with evaluation. Pt is noted to have low vocal intensity and mainly monotone pitch. Pt reports her speech is "slow" but seemed appropriate in conversation. Chief complaint: Pt reports she occasionally becomes strangled on thin liquids, but denies any difficulty with solids.    Objective:  Radiological Procedure: A videoflouroscopic evaluation of oral-preparatory, reflex initiation, and pharyngeal phases of the swallow was performed; as well as a screening of the upper esophageal phase.  I. POSTURE: Upright in flouro chair II. VIEW: Lateral  III.  COMPENSATORY STRATEGIES: None indicated IV. BOLUSES ADMINISTERED:  Thin Liquid: 3 cup sips, 1 multiple sip by straw  Nectar-thick Liquid: 2 cup sips  Honey-thick Liquid: Not tested  Puree: 1- 1/2 tsp, 1 -1 tsp bite  Solid: 1 bite of solid cracker V. RESULTS OF EVALUATION: A. ORAL PREPARATORY PHASE: (The lips, tongue, and velum are observed for strength and coordination)       **Overall Severity Rating: Within Functional limits. Pt did not have any difficulty masticating or clearing solid consistency. No pocketing or holding of any consistency was observed.   B. SWALLOW INITIATION/REFLEX: (The reflex is normal if "triggered" by the time the bolus reached the base of the tongue)  **Overall Severity Rating: Within functional limits. All swallows triggered at the base of tongue or the valleculae  C. PHARYNGEAL  PHASE: (Pharyngeal function is normal if the bolus shows rapid, smooth, and continuous transit through the pharynx and there is no pharyngeal residue after the swallow)  **Overall Severity Rating: Within functional limits. No significant residue was observed with any tested consistency. Good laryngeal elevation and hyoid excursion was observed.   D. LARYNGEAL PENETRATION: (Material entering into the laryngeal inlet/vestibule but not aspirated) No laryngeal penetration was observed with any consistency E. ASPIRATION: No aspiration was observed with any consistency F. ESOPHAGEAL PHASE: (Screening of the upper esophagus): No abnormalities were observed.   ASSESSMENT: This 72 y/o female presents with oropharyngeal swallow within functional limits. Oral phase was within functional limits for all consistencies. Pt was able to masticate and clear solid adequately. No pocketing or holding was observed with any tested consistency and no residue was observed. Pt had timely initiation of the swallow at the base of tongue or the valleculae. Pharyngeal function was within functional limits and no residue was observed. NO aspiration or laryngeal penetration was observed with any tested consistency. No esophageal abnormalities were observed during the screening of the upper esophagus. BA pill cleared well with thin water. * Pt was noted to have low vocal intensity and complains of difficulty with "finding her words." Pt would benefit from ENT evaluation as well as full speech language evaluation to aid with speech deficits.   PLAN/RECOMMENDATIONS: See below                    Dysphagia, unspecified type  Dysphagia, oropharyngeal - Plan: DG OP Swallowing Func-Medicare/Speech Path, DG OP Swallowing Func-Medicare/Speech Path    Recommendations/Treatment - 05/01/18 1309      Swallow Evaluation Recommendations   Recommended Consults  Consider ENT evaluation    SLP Diet Recommendations  Age  appropriate regular;Thin    Liquid Administration via  Cup;Straw    Medication Administration  Whole meds with liquid    Supervision  Patient able to self feed    Compensations  Minimize environmental distractions;Slow rate;Small sips/bites    Postural Changes  Seated upright at 90 degrees;Remain upright for at least 30 minutes after feeds/meals       Prognosis - 05/01/18 1311      Prognosis   Prognosis for Safe Diet Advancement  Good      Individuals Consulted   Consulted and Agree with Results and Recommendations  Patient    Report Sent to   Referring physician       Problem List Patient Active Problem List   Diagnosis Date Noted  . Confusion 06/21/2017   Lauree Chandler, MA, CCC-SLP  Speech-Language Pathologist  [place] 05/01/2018, 1:14 PM  [facility] DIAGNOSTIC  RADIOLOGY [address] [place], [place], [address] Phone: [phone]   Fax:     Name: Tina Bowman MRN: [id number] Date of Birth: 30-Mar-1946

## 2018-05-13 ENCOUNTER — Ambulatory Visit: Payer: Medicare HMO | Attending: Neurology | Admitting: Speech Pathology

## 2018-05-13 ENCOUNTER — Encounter: Payer: Self-pay | Admitting: Speech Pathology

## 2018-05-13 DIAGNOSIS — R490 Dysphonia: Secondary | ICD-10-CM | POA: Diagnosis not present

## 2018-05-13 NOTE — Therapy (Addendum)
[place] [facility] MAIN [facility] SERVICES [address] [place], [place], [address] Phone: [phone]   Fax:  [phone]  Speech Language Pathology Evaluation  Patient Details  Name: Tina Bowman MRN: [id number] Date of Birth: 1946-06-24 Referring Provider: Carroll Sage   Encounter Date: 05/13/2018  End of Session - 05/13/18 1450    Visit Number  1    Number of Visits  17    Date for SLP Re-Evaluation  07/08/18    SLP Start Time  1305    SLP Stop Time   1358    SLP Time Calculation (min)  53 min    Activity Tolerance  Patient tolerated treatment well       Past Medical History:  Diagnosis Date  . Asthma   . COPD (chronic obstructive pulmonary disease) (HCC)   . Fibromyalgia   . Heart disease   . Hypertension   . Slurred speech    foggy minded    Past Surgical History:  Procedure Laterality Date  . ABDOMINAL HYSTERECTOMY    . APPENDECTOMY    . BACK SURGERY    . BREAST EXCISIONAL BIOPSY Left    2000's  . CATARACT EXTRACTION     Also, retina surgery  . CHOLECYSTECTOMY    . SHOULDER SURGERY      There were no vitals filed for this visit.  Subjective Assessment - 05/13/18 1427    Subjective  Patient was very pleasant and a good historian providing many details from recent events.    Currently in Pain?  No/denies         SLP Evaluation [facility] - 05/13/18 1427      SLP Visit Information   SLP Received On  05/13/18    Referring Provider  Hemang Sherryll Burger    Onset Date  -- March 2018      Subjective   Patient/Family Stated Goal  I want my speech to be normal again.     General Information   HPI  Asthma, COPD, GERD, Mild esophagiitis, sleep apnea, subclinical hypothroidism      Prior Functional Status   Cognitive/Linguistic Baseline  Within functional limits    Type of Home  House     Lives With  Spouse      Cognition   Overall Cognitive Status  Within Functional Limits for tasks assessed    Attention  Focused    Focused  Attention  Appears intact    Memory  Appears intact    Awareness  Appears intact    Problem Solving  Appears intact      Auditory Comprehension   Overall Auditory Comprehension  Appears within functional limits for tasks assessed    Yes/No Questions  Within Functional Limits    Commands  Within Functional Limits for 1, 2, & 3 step commands    Conversation  Complex      Visual Recognition/Discrimination   Discrimination  Within Function Limits      Reading Comprehension   Reading Status  Within funtional limits tested to sentence level; reported increased blurred vision when attempting to read at length, no longer reads novels.     Expression   Primary Mode of Expression  Verbal      Verbal Expression   Overall Verbal Expression  Appears within functional limits for tasks assessed    Initiation  No impairment    Automatic Speech  Day of week      Written Expression   Written Expression  Exceptions  to [facility]    Overall Writen Expression  Largely WFL, pt reports handwriting is smaller than it used to be;Marland Kitchen.      Oral Motor/Sensory Function   Overall Oral Motor/Sensory Function  Appears within functional limits for tasks assessed      Motor Speech   Overall Motor Speech  Impaired    Respiration  Impaired    Level of Impairment  Conversation    Phonation  Low vocal intensity    Resonance  Within functional limits    Articulation  Impaired    Level of Impairment  Conversation    Intelligibility  Intelligibility reduced    Conversation  75-100% accurate    Motor Planning  Witnin functional limits    Motor Speech Errors  Aware    Effective Techniques  Slow rate;Increased vocal intensity;Over-articulate;Pacing    Phonation  Impaired    Volume  Soft Mild decreased volume    Pitch  -- Often monotone with sustained speech samples       Voice assessment:  Respiration:  Max phonation time of sustained "ah": average of 13.95 seconds (9.45, 10.4, 22.0 seconds) Average time patient  was able to sustain /s/: 7.17 seconds Average time patient was able to sustain /z/: 5.8 seconds S/z ration: 1.24 Breathing pattern: Noted predominant clavicular breathing pattern with sustained speech. Patient performed diaphragmatic/abdominal breathing in isolation in  2/10 attempts given max verbal and visual cues. Phonation: WFL Resonance: [facility] Articulation intermittent breakdowns largely due to decreased breath support for speech, Patient demonstrated improved articulatory accuracy with cues to increase vocal intensity at the sentence level. Prosody: Fast rate with little to no pauses for breaths with extended speech.  --Largely monotone with sustained speech, appropriate intonation noted with shorter sentence length samples. --Patient increased vocal intensity to produce sustained vowels and multisyllabic words given min to mod verbal and visual cues.   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 Motor speech function largely [facility] with mild lingual intention tremors. Patient currently awaiting test results for Parkinson's disease from MD.Patient recently underwent MBSS on 05/01/18 due to complaints of difficulty eating both liquids and solids. MBSS was [facility] with no evidence of oropharyngeal dysphagia. Patient here today to investigate increased difficulty "finding her words" and decreased vocal intensity since March 2018. Receptive/expressive language and cognitive skills appear within functional limits; no evidence of word finding difficulties throughout evaluation. However noted mild dysphonia c/b decreased respiratory support, decreased vocal intensity, and intermittent decreased articulatory accuracy with speech at the conversational levelresulting in decreased intelligibility. Recommend skilled ST treatment to teach compensatory strategies to improve intelligibility of speech and remediate dysphonia. Recommend PT  evaluation due to patient report of increased balance problems and falls.     SLP Education - 05/13/18 1449    Education Details  Re: role of SLP in voice treatment, goals of voice treatment, diaphragmatic breathing    Person(s) Educated  Patient    Methods  Explanation    Comprehension  Verbalized understanding;Need further instruction       SLP Short Term Goals - 05/13/18 1459      SLP SHORT TERM GOAL #1   Title  Patient will maintain appropriate loudenss when reading short paragraphs aloud given min verbal and visual cues with 90% accuracy.    Time  8    Period  Weeks    Status  New    Target Date  07/08/18      SLP  SHORT TERM GOAL #2   Title  Patient will demonstrate appropriate intonation when reading short paragraphs aloud and when answering wh questions with sentence long responses with 90% accuracy and min verbal cues.    Time  8    Period  Weeks    Status  New    Target Date  07/08/18      SLP SHORT TERM GOAL #3   Title  Patient will be independent with abdominal/diaphragmatic breathing exercises.     Time  8    Period  Weeks    Status  New    Target Date  07/08/18      SLP SHORT TERM GOAL #4   Title  Patient will use compensatory strategies of overarticulation, pacing and increased vocal intensity to improve intelligibility of speech and to repair communication breakdown with 80% accuracy given min verbal and/or visual cues.    Time  8    Period  Weeks    Status  New    Target Date  07/08/18       SLP Long Term Goals - 05/13/18 1453      SLP LONG TERM GOAL #1   Title  Patient will demonstrate clear and appropriate vocal intensity and intonation for sutained speech at the conversational level to increase intelligibility of spontaneous speech to communicate needs/wants in her functional living environment.    Time  8    Period  Weeks    Status  New    Target Date  07/08/18       Plan - 05/13/18 1451    Speech Therapy Frequency  2x / week    Duration   Other (comment) 8 weeks    Treatment/Interventions  Compensatory strategies;SLP instruction and feedback;Patient/family education;Functional tasks    Potential to Achieve Goals  Good    Potential Considerations  Ability to learn/carryover information;Family/community support    SLP Home Exercise Plan  To include breath support/abdominal breathing exercises, education and application of compensatory strategies including: overarticulation, pacing, increased vocal intensity exercises    Consulted and Agree with Plan of Care  Patient       Patient will benefit from skilled therapeutic intervention in order to improve the following deficits and impairments:   Dysphonia    Problem List Patient Active Problem List   Diagnosis Date Noted  . Confusion 06/21/2017    Ellionna Buckbee, MA, CCC-SLP 05/13/2018, 4:44 PM  [place] [facility] MAIN [facility] SERVICES [address] [place], [place], [address] Phone: [phone]   Fax:  [phone]  Name: Tina Bowman MRN: [id number] Date of Birth: [birth year]

## 2018-05-23 ENCOUNTER — Ambulatory Visit: Payer: Medicare HMO | Attending: Neurology | Admitting: Speech Pathology

## 2018-05-23 ENCOUNTER — Encounter: Payer: Self-pay | Admitting: Speech Pathology

## 2018-05-23 DIAGNOSIS — R490 Dysphonia: Secondary | ICD-10-CM | POA: Insufficient documentation

## 2018-05-23 DIAGNOSIS — R4701 Aphasia: Secondary | ICD-10-CM | POA: Diagnosis present

## 2018-05-23 DIAGNOSIS — G231 Progressive supranuclear ophthalmoplegia [Steele-Richardson-Olszewski]: Secondary | ICD-10-CM

## 2018-05-23 HISTORY — DX: Progressive supranuclear ophthalmoplegia (steele-Richardson-olszewski): G23.1

## 2018-05-23 NOTE — Therapy (Signed)
[facility] [address] [place], [place], [address] Phone: [phone]   Fax:  [phone]  Speech Language Pathology Treatment  Patient Details  Name: Tina Bowman MRN: [id number] Date of Birth: 07/01/1946 Referring Provider: Carroll SageHemeng Shah   Encounter Date: 05/23/2018  End of Session - 05/23/18 1647    Visit Number  2    Number of Visits  17    Date for SLP Re-Evaluation  07/08/18    SLP Start Time  1300    SLP Stop Time   1350    SLP Time Calculation (min)  50 min    Activity Tolerance  Patient tolerated treatment well       Past Medical History:  Diagnosis Date  . Asthma   . COPD (chronic obstructive pulmonary disease) (HCC)   . Fibromyalgia   . Heart disease   . Hypertension   . Progressive supranuclear palsy (HCC) 05/23/2018   Pt reported this new dx after f/u visit with Dr. Sherryll BurgerShah yesterday.  . Slurred speech    foggy minded    Past Surgical History:  Procedure Laterality Date  . ABDOMINAL HYSTERECTOMY    . APPENDECTOMY    . BACK SURGERY    . BREAST EXCISIONAL BIOPSY Left    2000's  . CATARACT EXTRACTION     Also, retina surgery  . CHOLECYSTECTOMY    . SHOULDER SURGERY      There were no vitals filed for this visit.  Subjective Assessment - 05/23/18 1409    Subjective  Patient was very pleasant and cooperative with all treatment tasks, smiling often. Patient reported recent new dx of Progressive Supranuclear Palsy following visit to Dr. Sherryll BurgerShah yesterday. Patient to f/u with [facility] on 06/03/18.    Currently in Pain?  No/denies            ADULT SLP TREATMENT - 05/23/18 0001      General Information   Behavior/Cognition  Alert;Cooperative;Pleasant mood    Patient Positioning  Upright in chair    HPI  Per MBSS report: Pt is referred by neurologist Dr. Sherryll BurgerShah due to difficulty with eating both solids and liquids. Pt was recently evaluated by neurology for having  lightheadedness and mental fog since before 2017. Pt complains of difficulty with her speech as well, which she describes as difficulty coming up with what she wants to say. Pt began sinemet following neurology visit which she reports has helped some of her symptoms but not speech. Pt reports she is awaiting test results to see if she has Parkinson's disease.      Treatment Provided   Treatment provided  Cognitive-Linquistic      Pain Assessment   Pain Assessment  No/denies pain      Cognitive-Linquistic Treatment   Treatment focused on  Voice    Skilled Treatment  Patient performed following resonant voice exercises to promote optimal vocal quality, intelligibility and breath support:  Sustained hum x5, sustained at average of 70dB, baseline loudness level of conversational speech=average of 65dB, given min to mod verbal and visual cues for increased intensity.  Hum-siren: patient produced short 5 second hum sirens at an average of 75-80dB.   Hum-vowels x10 with no evidence of laryngeal tension, with a range of 85-104dB. Patient reported increased awareness of using abdominals for breath support.  Hum-word level with initial /m/ words with increased vocal intensity given min verbal and visual cues.  Hum- short sentence level with increased vocal  intensity given min verbal cues. (9/10)     Assessment / Recommendations / Plan   Plan  Continue with current plan of care      Progression Toward Goals   Progression toward goals  Progressing toward goals       SLP Education - 05/23/18 1645    Education Details  Compensatory strategies: to increase breath support, intelligibility and promote optimal vocal quality    Person(s) Educated  Patient    Methods  Explanation;Verbal cues;Handout;Demonstration    Comprehension  Verbalized understanding;Returned demonstration;Verbal cues required;Need further instruction       SLP Short Term Goals - 05/13/18 1459      SLP SHORT TERM GOAL #1    Title  Patient will maintain appropriate loudenss when reading short paragraphs aloud given min verbal and visual cues with 90% accuracy.    Time  8    Period  Weeks    Status  New    Target Date  07/08/18      SLP SHORT TERM GOAL #2   Title  Patient will demonstrate appropriate intonation when reading short paragraphs aloud and when answering wh questions with sentence long responses with 90% accuracy and min verbal cues.    Time  8    Period  Weeks    Status  New    Target Date  07/08/18      SLP SHORT TERM GOAL #3   Title  Patient will be independent with abdominal/diaphragmatic breathing exercises.     Time  8    Period  Weeks    Status  New    Target Date  07/08/18      SLP SHORT TERM GOAL #4   Title  Patient will use compensatory strategies of overarticulation, pacing and increased vocal intensity to improve intelligibility of speech and to repair communication breakdown with 80% accuracy given min verbal and/or visual cues.    Time  8    Period  Weeks    Status  New    Target Date  07/08/18       SLP Long Term Goals - 05/13/18 1453      SLP LONG TERM GOAL #1   Title  Patient will demonstrate clear and appropriate vocal intensity and intonation for sutained speech at the conversational level to increase intelligibility of spontaneous speech to communicate needs/wants in her functional living environment.    Time  8    Period  Weeks    Status  New    Target Date  07/08/18       Plan - 05/23/18 1648    Clinical Impression Statement  Patient demonstrated improved vocal intensity and breath support given min to mod verbal and visual cues. Patient reported much increased awareness and accuracy using abdominal breathing throughout treatment. Provided written HEP including: Sustained hum, hum-siren and hum+vowels resonant voice excercises.    Speech Therapy Frequency  2x / week    Duration  Other (comment) 8 weeks    Treatment/Interventions  Compensatory strategies;SLP  instruction and feedback;Patient/family education;Functional tasks    Potential to Achieve Goals  Good    Potential Considerations  Ability to learn/carryover information;Family/community support    SLP Home Exercise Plan  To include breath support/abdominal breathing exercises, education and application of compensatory strategies including: overarticulation, pacing, increased vocal intensity exercises    Consulted and Agree with Plan of Care  Patient       Patient will benefit from skilled therapeutic intervention in order to improve  the following deficits and impairments:   Dysphonia    Problem List Patient Active Problem List   Diagnosis Date Noted  . Confusion 06/21/2017    Tina Chuang, MA, CCC-SLP 05/23/2018, 4:55 PM  [place] [facility] MAIN [facility] SERVICES [address] [place], [place], [address] Phone: [phone]   Fax:  [phone]   Name: Tina Bowman MRN: [id number] Date of Birth: [birth year]

## 2018-05-27 ENCOUNTER — Encounter: Payer: Self-pay | Admitting: Speech Pathology

## 2018-05-27 ENCOUNTER — Ambulatory Visit: Payer: Medicare HMO | Admitting: Speech Pathology

## 2018-05-27 DIAGNOSIS — R490 Dysphonia: Secondary | ICD-10-CM | POA: Diagnosis not present

## 2018-05-27 NOTE — Therapy (Signed)
[facility] [facility] [address] [place], [place], [address] Phone: [phone]   Fax:  [phone]  Speech Language Pathology Treatment  Patient Details  Name: Tina ChickDiane N Bushnell MRN: [id number] Date of Birth: May 10, 1946 Referring Provider: Carroll SageHemeng Shah   Encounter Date: 05/27/2018  End of Session - 05/27/18 1544    Visit Number  3    Number of Visits  17    Date for SLP Re-Evaluation  07/08/18    SLP Start Time  1300    SLP Stop Time   1355    SLP Time Calculation (min)  55 min    Activity Tolerance  Patient tolerated treatment well       Past Medical History:  Diagnosis Date  . Asthma   . COPD (chronic obstructive pulmonary disease) (HCC)   . Fibromyalgia   . Heart disease   . Hypertension   . Progressive supranuclear palsy (HCC) 05/23/2018   Pt reported this new dx after f/u visit with Dr. Sherryll BurgerShah yesterday.  . Slurred speech    foggy minded    Past Surgical History:  Procedure Laterality Date  . ABDOMINAL HYSTERECTOMY    . APPENDECTOMY    . BACK SURGERY    . BREAST EXCISIONAL BIOPSY Left    2000's  . CATARACT EXTRACTION     Also, retina surgery  . CHOLECYSTECTOMY    . SHOULDER SURGERY      There were no vitals filed for this visit.  Subjective Assessment - 05/27/18 1541    Subjective  Patient completed all treatment exercises with a smile.    Currently in Pain?  No/denies            ADULT SLP TREATMENT - 05/27/18 0001      General Information   Behavior/Cognition  Alert;Cooperative;Pleasant mood    Patient Positioning  Upright in chair    HPI  Per MBSS report: Pt is referred by neurologist Dr. Sherryll BurgerShah due to difficulty with eating both solids and liquids. Pt was recently evaluated by neurology for having lightheadedness and mental fog since before 2017. Pt complains of difficulty with her speech as well, which she describes as difficulty coming up with what she wants to say. Pt began sinemet following  neurology visit which she reports has helped some of her symptoms but not speech. Pt reports she is awaiting test results to see if she has Parkinson's disease.      Treatment Provided   Treatment provided  Cognitive-Linquistic      Pain Assessment   Pain Assessment  No/denies pain      Cognitive-Linquistic Treatment   Treatment focused on  Voice    Skilled Treatment  Patient performed following resonant voice exercises to promote optimal vocal quality, intelligibility and breath support:  Sustained hum x5, sustained at average of 75dB, baseline loudness level of conversational speech=average of 70dB, given min to mod verbal and visual cues for increased intensity.  Sustained /a/ for an average of 13 seconds and an average vocal intensity level of 85dB given mod verbal and visual cues.  Hum- "ng" siren: patient produced short 5-8 second hum sirens at an average of 72-78dB given mod verbal and visual cues.  Hum-vowels x10 with no evidence of laryngeal tension, with a range of 82-88dB. Patient reported increased awareness of using abdominals for breath support.  Hum-word level with initial /m/ words with increased vocal intensity given min verbal and visual cues.  Produced automatic speech sequences x10 sets  with a vocal intensity range of 79-85 dB given mod verbal and visual cues.   Hum+phrases with increased vocal intensity range of 77-80dB given min to mod verbal cues.       Assessment / Recommendations / Plan   Plan  Continue with current plan of care      Progression Toward Goals   Progression toward goals  Progressing toward goals       SLP Education - 05/27/18 1542    Education Details  Compensatory strategies: to increase breath support, intelligibility and promote optimal vocal quality    Person(s) Educated  Patient    Methods  Explanation;Demonstration;Verbal cues;Handout    Comprehension  Verbalized understanding;Returned demonstration;Verbal cues required;Need further  instruction       SLP Short Term Goals - 05/13/18 1459      SLP SHORT TERM GOAL #1   Title  Patient will maintain appropriate loudenss when reading short paragraphs aloud given min verbal and visual cues with 90% accuracy.    Time  8    Period  Weeks    Status  New    Target Date  07/08/18      SLP SHORT TERM GOAL #2   Title  Patient will demonstrate appropriate intonation when reading short paragraphs aloud and when answering wh questions with sentence long responses with 90% accuracy and min verbal cues.    Time  8    Period  Weeks    Status  New    Target Date  07/08/18      SLP SHORT TERM GOAL #3   Title  Patient will be independent with abdominal/diaphragmatic breathing exercises.     Time  8    Period  Weeks    Status  New    Target Date  07/08/18      SLP SHORT TERM GOAL #4   Title  Patient will use compensatory strategies of overarticulation, pacing and increased vocal intensity to improve intelligibility of speech and to repair communication breakdown with 80% accuracy given min verbal and/or visual cues.    Time  8    Period  Weeks    Status  New    Target Date  07/08/18       SLP Long Term Goals - 05/13/18 1453      SLP LONG TERM GOAL #1   Title  Patient will demonstrate clear and appropriate vocal intensity and intonation for sutained speech at the conversational level to increase intelligibility of spontaneous speech to communicate needs/wants in her functional living environment.    Time  8    Period  Weeks    Status  New    Target Date  07/08/18       Plan - 05/27/18 1545    Clinical Impression Statement  Patient demonstrated consistent accuracy performing treatment exercises given regular min to mod verbal cues for overarticulation, increased vocal intensity, and pacing. Patient reports she thinks she may also have trouble thinking of her words. No apparant word finding difficutlty noted in conversation or on recent SLP evaluation. Patient also reports  recent trial of not taking sinemet beginning last Thursday. SLP to further investigate word finding difficulties in next treatment session.    Speech Therapy Frequency  2x / week    Duration  Other (comment) 8 weeks    Treatment/Interventions  Compensatory strategies;SLP instruction and feedback;Patient/family education;Functional tasks    Potential to Achieve Goals  Good    Potential Considerations  Ability to learn/carryover information;Family/community support  SLP Home Exercise Plan  To include breath support/abdominal breathing exercises, education and application of compensatory strategies including: overarticulation, pacing, increased vocal intensity exercises    Consulted and Agree with Plan of Care  Patient       Patient will benefit from skilled therapeutic intervention in order to improve the following deficits and impairments:   Dysphonia    Problem List Patient Active Problem List   Diagnosis Date Noted  . Confusion 06/21/2017    Nasri Boakye, MA, CCC-SLP 05/27/2018, 3:54 PM  [place] [facility] MAIN [facility] SERVICES [address] [place], [place], [address] Phone: [phone]   Fax:  [phone]   Name: GRADY LUCCI MRN: [id number] Date of Birth: 12/30/1945

## 2018-05-30 ENCOUNTER — Encounter: Payer: Self-pay | Admitting: Speech Pathology

## 2018-05-30 ENCOUNTER — Ambulatory Visit: Payer: Medicare HMO | Admitting: Speech Pathology

## 2018-05-30 DIAGNOSIS — R4701 Aphasia: Secondary | ICD-10-CM

## 2018-05-30 DIAGNOSIS — R490 Dysphonia: Secondary | ICD-10-CM

## 2018-05-30 NOTE — Therapy (Signed)
[place] [facility] MAIN [facility] SERVICES [address] [place], [place], [address] Phone: [phone]   Fax:  [phone]  Speech Language Pathology Treatment  Patient Details  Name: Tina Bowman MRN: [id number] Date of Birth: 1945-11-04 Referring Provider: Carroll Sage   Encounter Date: 05/30/2018  End of Session - 05/30/18 1716    Visit Number  4    Number of Visits  17    Date for SLP Re-Evaluation  07/08/18    SLP Start Time  1400    SLP Stop Time   1455    SLP Time Calculation (min)  55 min    Activity Tolerance  Patient tolerated treatment well       Past Medical History:  Diagnosis Date  . Asthma   . COPD (chronic obstructive pulmonary disease) (HCC)   . Fibromyalgia   . Heart disease   . Hypertension   . Progressive supranuclear palsy (HCC) 05/23/2018   Pt reported this new dx after f/u visit with Dr. Sherryll Burger yesterday.  . Slurred speech    foggy minded    Past Surgical History:  Procedure Laterality Date  . ABDOMINAL HYSTERECTOMY    . APPENDECTOMY    . BACK SURGERY    . BREAST EXCISIONAL BIOPSY Left    2000's  . CATARACT EXTRACTION     Also, retina surgery  . CHOLECYSTECTOMY    . SHOULDER SURGERY      There were no vitals filed for this visit.  Subjective Assessment - 05/30/18 1714    Subjective  Patient reported she was having a good day and is looking forward to seeing her sons tonight for game night.    Currently in Pain?  No/denies            ADULT SLP TREATMENT - 05/30/18 0001      General Information   Behavior/Cognition  Alert;Cooperative;Pleasant mood    Patient Positioning  Upright in chair    HPI  Per MBSS report: Pt is referred by neurologist Dr. Sherryll Burger due to difficulty with eating both solids and liquids. Pt was recently evaluated by neurology for having lightheadedness and mental fog since before 2017. Pt complains of difficulty with her speech as well, which she describes as difficulty coming up with  what she wants to say. Pt began sinemet following neurology visit which she reports has helped some of her symptoms but not speech. Pt reports she is awaiting test results to see if she has Parkinson's disease.      Treatment Provided   Treatment provided  Cognitive-Linquistic      Pain Assessment   Pain Assessment  No/denies pain      Cognitive-Linquistic Treatment   Treatment focused on  Voice    Skilled Treatment  Patient performed following SPEAK OUT voice exercises to promote optimal vocal quality, intelligibility and breath support given moderate verbal and visual cues throughout:  Performed vocal warm-ups while maintaining a minimum vocal intensity of 70 dB. Sustained phonation of /a/ for average of 10 seconds while maintaining a minimum of 72 dB. Increased pitch range using vocal glides while maintaining a minimum of 80  dB. Recited automatic speech sequences while maintaining a minimum of 74 dB. Repeated phrases while maintaining a minimum of 77 dB. Produced spontaneous conversation while maintaining a minimum of 70 dB. Produced answers to simple cognitive linguistic tasks while maintaining a minimum of 70dB.     Assessment / Recommendations / Plan   Plan  Continue with  current plan of care      Progression Toward Goals   Progression toward goals  Progressing toward goals       SLP Education - 05/30/18 1715    Education Details  Compensatory strategies to increase breath support & intelligibility including pacing and overarticulation; SPEAK OUT voice treatment program    Person(s) Educated  Patient    Methods  Explanation;Demonstration;Verbal cues;Handout    Comprehension  Verbalized understanding;Verbal cues required;Returned demonstration;Need further instruction       SLP Short Term Goals - 05/30/18 1731      SLP SHORT TERM GOAL #1   Title  Patient will maintain appropriate loudenss when reading short paragraphs aloud given min verbal and visual cues with 90%  accuracy.    Time  8    Period  Weeks    Status  New      SLP SHORT TERM GOAL #2   Title  Patient will demonstrate appropriate intonation when reading short paragraphs aloud and when answering wh questions with sentence long responses with 90% accuracy and min verbal cues.    Time  8    Period  Weeks    Status  New      SLP SHORT TERM GOAL #3   Title  Patient will be independent with abdominal/diaphragmatic breathing exercises.     Time  8    Period  Weeks    Status  New      SLP SHORT TERM GOAL #4   Title  Patient will use compensatory strategies of overarticulation, pacing and increased vocal intensity to improve intelligibility of speech and to repair communication breakdown with 80% accuracy given min verbal and/or visual cues.    Time  8    Period  Weeks    Status  New      SLP SHORT TERM GOAL #5   Title  Patient will complete higher level word finding tasks independently with 90% accuracy.    Time  8    Period  Weeks    Status  New    Target Date  07/08/18      Additional Short Term Goals   Additional Short Term Goals  Yes      SLP SHORT TERM GOAL #6   Title  Patient will be independent with compensatory strategies to assist word finding difficulties in conversation.    Time  8    Period  Weeks    Status  New    Target Date  07/08/18       SLP Long Term Goals - 05/30/18 1733      SLP LONG TERM GOAL #1   Title  Patient will demonstrate clear and appropriate vocal intensity and intonation for sutained speech at the conversational level to increase intelligibility of spontaneous speech to communicate needs/wants in her functional living environment.    Time  8    Period  Weeks    Status  New       Plan - 05/30/18 1717    Clinical Impression Statement  Patient demonstrated improved vocal intensity and pacing given min to mod verbal and visual cues. Following further investigation into reported word finding difficulties that did not present on initial SLP  evaluation, patient demonstrated with very mild aphasia characterized by mild to moderate difficulty with higher level generative naming tasks. Patient reports she is ending her 1 week trial of being off of sinemet, due to increased balance problems and decreased gait speed. She reported she is planning  on beginning taking sinemet again tonight.     Speech Therapy Frequency  2x / week    Duration  Other (comment)   8 weeks   Treatment/Interventions  Compensatory strategies;SLP instruction and feedback;Patient/family education;Functional tasks    Potential to Achieve Goals  Good    Potential Considerations  Ability to learn/carryover information;Family/community support    SLP Home Exercise Plan  To include SPEAK OUT voice treatment program exercises, application of compensatory strategies including: speaking with "intent", overarticulation, pacing, increased vocal intensity exercises    Consulted and Agree with Plan of Care  Patient       Patient will benefit from skilled therapeutic intervention in order to improve the following deficits and impairments:   Dysphonia  Aphasia    Problem List Patient Active Problem List   Diagnosis Date Noted  . Confusion 06/21/2017    Haylen Shelnutt, MA, CCC-SLP 05/30/2018, 5:43 PM  [place] [facility] MAIN [facility] SERVICES [address] [place], [place], [address] Phone: [phone]   Fax:  [phone]   Name: Tina Bowman MRN: [id number] Date of Birth: 1946/10/20

## 2018-06-03 ENCOUNTER — Ambulatory Visit: Payer: Medicare HMO | Admitting: Speech Pathology

## 2018-06-06 ENCOUNTER — Encounter: Payer: Self-pay | Admitting: Speech Pathology

## 2018-06-06 ENCOUNTER — Ambulatory Visit: Payer: Medicare HMO | Admitting: Speech Pathology

## 2018-06-06 DIAGNOSIS — R490 Dysphonia: Secondary | ICD-10-CM

## 2018-06-06 NOTE — Therapy (Signed)
[place] [facility] MAIN [facility] [address] [place], [place], [address] Phone: [phone]   Fax:  [phone]  Speech Language Pathology Treatment  Patient Details  Name: Tina Bowman MRN: [id number] Date of Birth: 16-May-1946 Referring Provider: Carroll SageHemeng Shah   Encounter Date: 06/06/2018  End of Session - 06/06/18 1449    Visit Number  5    Number of Visits  17    Date for SLP Re-Evaluation  07/08/18    SLP Start Time  1300    SLP Stop Time   1345    SLP Time Calculation (min)  45 min    Activity Tolerance  Patient tolerated treatment well       Past Medical History:  Diagnosis Date  . Asthma   . COPD (chronic obstructive pulmonary disease) (HCC)   . Fibromyalgia   . Heart disease   . Hypertension   . Progressive supranuclear palsy (HCC) 05/23/2018   Pt reported this new dx after f/u visit with Dr. Sherryll BurgerShah yesterday.  . Slurred speech    foggy minded    Past Surgical History:  Procedure Laterality Date  . ABDOMINAL HYSTERECTOMY    . APPENDECTOMY    . BACK SURGERY    . BREAST EXCISIONAL BIOPSY Left    2000's  . CATARACT EXTRACTION     Also, retina surgery  . CHOLECYSTECTOMY    . SHOULDER SURGERY      There were no vitals filed for this visit.  Subjective Assessment - 06/06/18 1446    Subjective  Patient reported she is doing well and said she [facility] who gave her signed written orders for PT.    Currently in Pain?  No/denies            ADULT SLP TREATMENT - 06/06/18 0001      General Information   Behavior/Cognition  Alert;Cooperative;Pleasant mood    Patient Positioning  Upright in chair    HPI  Per MBSS report: Pt is referred by neurologist Dr. Sherryll BurgerShah due to difficulty with eating both solids and liquids. Pt was recently evaluated by neurology for having lightheadedness and mental fog since before 2017. Pt complains of difficulty with her speech as well,  which she describes as difficulty coming up with what she wants to say. Pt began sinemet following neurology visit which she reports has helped some of her symptoms but not speech. Pt reports she is awaiting test results to see if she has Parkinson's disease.      Treatment Provided   Treatment provided  Cognitive-Linquistic      Pain Assessment   Pain Assessment  No/denies pain      Cognitive-Linquistic Treatment   Treatment focused on  Voice    Skilled Treatment  Patient performed following SPEAK OUT parkinson's voice treatment exercises to promote optimal vocal quality, intelligibility and breath support:  Performed vocal warm-ups while maintaining a minimum vocal intensity of 74 dB given min verbal cues. Sustained phonation of /a/ for average of 10 seconds while maintaining a minimum of 81 dB given min verbal cues. Increased pitch range using vocal glides while maintaining a minimum of 79 dB given min verbal cues. Recited automatic speech sequences while maintaining a minimum of 77 dB given min verbal cues. Repeated phrases while maintaining a minimum of 75 dB given min verbal cues. Read phrases while maintaining a minimum of 75 dB given min verbal cues. Produced spontaneous  conversation while maintaining a minimum of 74 dB given min verbal cues. Produced answers to simple cognitive linguistic tasks while maintaining a minimum of 74 dB given min verbal cues.       Assessment / Recommendations / Plan   Plan  Continue with current plan of care      Progression Toward Goals   Progression toward goals  Progressing toward goals       SLP Education - 06/06/18 1448    Education Details  Compensatory strategies to increase vocal intensity, breath support & intelligibility including pacing and overarticulation; and the SPEAK OUT voice treatment program    Person(s) Educated  Patient    Methods  Explanation;Demonstration;Verbal cues;Handout    Comprehension  Verbalized  understanding;Returned demonstration;Verbal cues required;Need further instruction       SLP Short Term Goals - 05/30/18 1731      SLP SHORT TERM GOAL #1   Title  Patient will maintain appropriate loudenss when reading short paragraphs aloud given min verbal and visual cues with 90% accuracy.    Time  8    Period  Weeks    Status  New      SLP SHORT TERM GOAL #2   Title  Patient will demonstrate appropriate intonation when reading short paragraphs aloud and when answering wh questions with sentence long responses with 90% accuracy and min verbal cues.    Time  8    Period  Weeks    Status  New      SLP SHORT TERM GOAL #3   Title  Patient will be independent with abdominal/diaphragmatic breathing exercises.     Time  8    Period  Weeks    Status  New      SLP SHORT TERM GOAL #4   Title  Patient will use compensatory strategies of overarticulation, pacing and increased vocal intensity to improve intelligibility of speech and to repair communication breakdown with 80% accuracy given min verbal and/or visual cues.    Time  8    Period  Weeks    Status  New      SLP SHORT TERM GOAL #5   Title  Patient will complete higher level word finding tasks independently with 90% accuracy.    Time  8    Period  Weeks    Status  New    Target Date  07/08/18      Additional Short Term Goals   Additional Short Term Goals  Yes      SLP SHORT TERM GOAL #6   Title  Patient will be independent with compensatory strategies to assist word finding difficulties in conversation.    Time  8    Period  Weeks    Status  New    Target Date  07/08/18       SLP Long Term Goals - 05/30/18 1733      SLP LONG TERM GOAL #1   Title  Patient will demonstrate clear and appropriate vocal intensity and intonation for sutained speech at the conversational level to increase intelligibility of spontaneous speech to communicate needs/wants in her functional living environment.    Time  8    Period  Weeks     Status  New       Plan - 06/06/18 1450    Clinical Impression Statement  Patient demonstrated consistent increased vocal intensity and improved intelligibility throughout treatment and improved independence requiring a decreased level of cueing. 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 She also reported he increased the dosage of her Sinemet. Patient reports she is feeling much better after increasing her dosage. She reports she feels less stiff.    Speech Therapy Frequency  2x / week    Duration  Other (comment)   8 weeks   Treatment/Interventions  Compensatory strategies;SLP instruction and feedback;Patient/family education;Functional tasks    Potential to Achieve Goals  Good    Potential Considerations  Ability to learn/carryover information;Family/community support    SLP Home Exercise Plan  To include SPEAK OUT voice treatment program exercises, application of compensatory strategies including: speaking with "intent", overarticulation, pacing, increased vocal intensity exercises    Consulted and Agree with Plan of Care  Patient       Patient will benefit from skilled therapeutic intervention in order to improve the following deficits and impairments:   Dysphonia    Problem List Patient Active Problem List   Diagnosis Date Noted  . Confusion 06/21/2017    Ryo Klang, MA, CCC-SLP 06/06/2018, 2:51 PM  [place] [facility] MAIN [facility] [address] [place], [place], [address] Phone: [phone]   Fax:  [phone]   Name: Tina Bowman MRN: [id number] Date of Birth: 18-Jul-1946

## 2018-06-10 ENCOUNTER — Encounter: Payer: Self-pay | Admitting: Speech Pathology

## 2018-06-10 ENCOUNTER — Ambulatory Visit: Payer: Medicare HMO | Admitting: Speech Pathology

## 2018-06-10 DIAGNOSIS — R490 Dysphonia: Secondary | ICD-10-CM | POA: Diagnosis not present

## 2018-06-10 NOTE — Therapy (Signed)
[place] [facility] [address] [place], [place], [address] Phone: [phone]   Fax:  [phone]  Speech Language Pathology Treatment  Patient Details  Name: Tina Bowman MRN: [id number] Date of Birth: 04/14/1946 Referring Provider: Carroll SageHemeng Shah   Encounter Date: 06/10/2018  End of Session - 06/10/18 1359    Visit Number  6    Number of Visits  17    Date for SLP Re-Evaluation  07/08/18    SLP Start Time  1300    SLP Stop Time   1345    SLP Time Calculation (min)  45 min    Activity Tolerance  Patient tolerated treatment well       Past Medical History:  Diagnosis Date  . Asthma   . COPD (chronic obstructive pulmonary disease) (HCC)   . Fibromyalgia   . Heart disease   . Hypertension   . 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  . Slurred speech    foggy minded    Past Surgical History:  Procedure Laterality Date  . ABDOMINAL HYSTERECTOMY    . APPENDECTOMY    . BACK SURGERY    . BREAST EXCISIONAL BIOPSY Left    2000's  . CATARACT EXTRACTION     Also, retina surgery  . CHOLECYSTECTOMY    . SHOULDER SURGERY      There were no vitals filed for this visit.  Subjective Assessment - 06/10/18 1357    Subjective  Patient reported she is doing well but had some trouble finding her words this morning.    Currently in Pain?  No/denies            ADULT SLP TREATMENT - 06/10/18 0001      General Information   Behavior/Cognition  Alert;Cooperative;Pleasant mood    Patient Positioning  Upright in chair    HPI  Per MBSS report: Pt is referred by neurologist Dr. Sherryll BurgerShah due to difficulty with eating both solids and liquids. Pt was recently evaluated by neurology for having lightheadedness and mental fog since before 2017. Pt complains of difficulty with her speech as well, which  she describes as difficulty coming up with what she wants to say. Pt began sinemet following neurology visit which she reports has helped some of her symptoms but not speech. Pt reports she is awaiting test results to see if she has Parkinson's disease.      Treatment Provided   Treatment provided  Cognitive-Linquistic      Pain Assessment   Pain Assessment  No/denies pain      Cognitive-Linquistic Treatment   Treatment focused on  Voice    Skilled Treatment Patient performed following SPEAK OUT voice treatment program exercises given intermittent min verbal and visual cues: Performed vocal warm-ups while maintaining a minimum vocal intensity of 77 dB. Sustained phonation of /a/ for average of 10 seconds while maintaining a minimum of 85 dB. Increased pitch range using vocal glides while maintaining a minimum of 85 dB. Recited automatic speech sequences while maintaining a minimum of 77 dB. Read phrases while maintaining a minimum of 75 dB. Produced spontaneous conversation while maintaining a minimum of 72 dB. Produced answers to simple cognitive linguistic tasks while maintaining a minimum of 74 dB. Produced answers to complex cognitive linguistic tasks while maintaining a minimum of 74dB.     Assessment / Recommendations /  Plan   Plan  Continue with current plan of care      Progression Toward Goals   Progression toward goals  Progressing toward goals       SLP Education - 06/10/18 1358    Education Details  Compensatory strategies to increase vocal intensity, breath support and intelligibility including pacing, overarticulation and SPEAK OUT voice treatment exercises, speaking with "intent"    Person(s) Educated  Patient    Methods  Explanation;Demonstration;Verbal cues;Handout    Comprehension  Verbalized understanding;Need further instruction;Verbal cues required;Returned demonstration       SLP Short Term Goals - 05/30/18 1731      SLP SHORT TERM GOAL #1   Title  Patient  will maintain appropriate loudenss when reading short paragraphs aloud given min verbal and visual cues with 90% accuracy.    Time  8    Period  Weeks    Status  New      SLP SHORT TERM GOAL #2   Title  Patient will demonstrate appropriate intonation when reading short paragraphs aloud and when answering wh questions with sentence long responses with 90% accuracy and min verbal cues.    Time  8    Period  Weeks    Status  New      SLP SHORT TERM GOAL #3   Title  Patient will be independent with abdominal/diaphragmatic breathing exercises.     Time  8    Period  Weeks    Status  New      SLP SHORT TERM GOAL #4   Title  Patient will use compensatory strategies of overarticulation, pacing and increased vocal intensity to improve intelligibility of speech and to repair communication breakdown with 80% accuracy given min verbal and/or visual cues.    Time  8    Period  Weeks    Status  New      SLP SHORT TERM GOAL #5   Title  Patient will complete higher level word finding tasks independently with 90% accuracy.    Time  8    Period  Weeks    Status  New    Target Date  07/08/18      Additional Short Term Goals   Additional Short Term Goals  Yes      SLP SHORT TERM GOAL #6   Title  Patient will be independent with compensatory strategies to assist word finding difficulties in conversation.    Time  8    Period  Weeks    Status  New    Target Date  07/08/18       SLP Long Term Goals - 05/30/18 1733      SLP LONG TERM GOAL #1   Title  Patient will demonstrate clear and appropriate vocal intensity and intonation for sutained speech at the conversational level to increase intelligibility of spontaneous speech to communicate needs/wants in her functional living environment.    Time  8    Period  Weeks    Status  New       Plan - 06/10/18 1359    Clinical Impression Statement  Patient demonstrated consistent increased vocal intensity and overarticulation throughout treatment  given intermittent min cues. Overall intelligibility increased to 95% in conversation.    Speech Therapy Frequency  2x / week    Duration  Other (comment)   8 weeks   Treatment/Interventions  Compensatory strategies;SLP instruction and feedback;Patient/family education;Functional tasks    Potential to Achieve Goals  Good  Potential Considerations  Ability to learn/carryover information;Family/community support    SLP Home Exercise Plan  To include SPEAK OUT voice treatment program exercises, application of compensatory strategies including: speaking with "intent", overarticulation, pacing, increased vocal intensity exercises    Consulted and Agree with Plan of Care  Patient       Patient will benefit from skilled therapeutic intervention in order to improve the following deficits and impairments:   Dysphonia    Problem List Patient Active Problem List   Diagnosis Date Noted  . Confusion 06/21/2017    Felcia Huebert, MA, CCC-SLP 06/10/2018, 2:54 PM  [place] [facility] [address] [place], [place], [address] Phone: [phone]   Fax:  [phone]   Name: Tina Bowman MRN: [id number] Date of Birth: 02/10/1946

## 2018-06-13 ENCOUNTER — Ambulatory Visit: Payer: Medicare HMO | Admitting: Speech Pathology

## 2018-06-13 ENCOUNTER — Encounter: Payer: Self-pay | Admitting: Speech Pathology

## 2018-06-13 DIAGNOSIS — R490 Dysphonia: Secondary | ICD-10-CM

## 2018-06-13 NOTE — Therapy (Signed)
[facility] MAIN [facility] SERVICES [address] [place], [place], [address] Phone: [phone]   Fax:  [phone]  Speech Language Pathology Treatment  Patient Details  Name: Tina Bowman MRN: [id number] Date of Birth: Aug 03, 1946 Referring Provider: Carroll Sage   Encounter Date: 06/13/2018  End of Session - 06/13/18 1353    Visit Number  7    Number of Visits  17    Date for SLP Re-Evaluation  07/08/18    SLP Start Time  1300    SLP Stop Time   1350    SLP Time Calculation (min)  50 min    Activity Tolerance  Patient tolerated treatment well       Past Medical History:  Diagnosis Date  . Asthma   . COPD (chronic obstructive pulmonary disease) (HCC)   . Fibromyalgia   . Heart disease   . Hypertension   . Progressive supranuclear palsy (HCC) 05/23/2018   Pt reported this dx has been recinded by the MD at the [facility] disorders clinic, and replaced with Atypical Parkinsonism..  . Slurred speech    foggy minded    Past Surgical History:  Procedure Laterality Date  . ABDOMINAL HYSTERECTOMY    . APPENDECTOMY    . BACK SURGERY    . BREAST EXCISIONAL BIOPSY Left    2000's  . CATARACT EXTRACTION     Also, retina surgery  . CHOLECYSTECTOMY    . SHOULDER SURGERY      There were no vitals filed for this visit.  Subjective Assessment - 06/13/18 1351    Subjective  Patient reported she was talking to her friend on the telephone when her friend reported she could tell a real difference in the patient's voice!    Currently in Pain?  No/denies            ADULT SLP TREATMENT - 06/13/18 0001      General Information   Behavior/Cognition  Alert;Cooperative;Pleasant mood    Patient Positioning  Upright in chair    HPI  Per MBSS report: Pt is referred by neurologist Dr. Sherryll Burger due to difficulty with eating both solids and liquids. Pt was recently evaluated by neurology for having lightheadedness and mental fog since before 2017.  Pt complains of difficulty with her speech as well, which she describes as difficulty coming up with what she wants to say. Pt began sinemet following neurology visit which she reports has helped some of her symptoms but not speech. Pt reports she is awaiting test results to see if she has Parkinson's disease.      Treatment Provided   Treatment provided  Cognitive-Linquistic      Pain Assessment   Pain Assessment  No/denies pain      Cognitive-Linquistic Treatment   Treatment focused on  Voice    Skilled Treatment  Patient performed following SPEAK OUT exercises to promote optimal vocal quality, intelligibility and breath support given min verbal and visual cues:  Performed vocal warm-ups while maintaining a minimum vocal intensity of 74 dB. Sustained phonation of /a/ for average of 10 seconds while maintaining a minimum of 81 dB. Increased pitch range using vocal glides while maintaining a minimum of 82 dB. Recited automatic speech sequences while maintaining a minimum of 74 dB. Read sentences while maintaining a minimum of 76 dB. Produced spontaneous conversation while maintaining a minimum of 68 dB. Produced answers to simple cognitive linguistic tasks while maintaining a minimum of 70 dB. Produced answers to  complex cognitive linguistic tasks while maintaining a minimum of 76dB.  Patient maintained appropriate loudness and intonation when reading sentences with 90% accuracy given min verbal cues.  Patient used pacing, overarticulation, increased vocal intensity to improve intelligibility of speech throughout all treatment exercises with 100% accuracy given min verbal and visual cues.     Assessment / Recommendations / Plan   Plan  Continue with current plan of care      Progression Toward Goals   Progression toward goals  Progressing toward goals       SLP Education - 06/13/18 1352    Education Details  Compensatory strategies to increase vocal intensity, breath support and  intelligibility including pacing, overarticulation and SPEAK OUT voice treatment exercises, speaking with "intent"    Person(s) Educated  Patient    Methods  Explanation;Demonstration;Handout    Comprehension  Verbalized understanding;Need further instruction;Returned demonstration;Verbal cues required       SLP Short Term Goals - 05/30/18 1731      SLP SHORT TERM GOAL #1   Title  Patient will maintain appropriate loudenss when reading short paragraphs aloud given min verbal and visual cues with 90% accuracy.    Time  8    Period  Weeks    Status  New      SLP SHORT TERM GOAL #2   Title  Patient will demonstrate appropriate intonation when reading short paragraphs aloud and when answering wh questions with sentence long responses with 90% accuracy and min verbal cues.    Time  8    Period  Weeks    Status  New      SLP SHORT TERM GOAL #3   Title  Patient will be independent with abdominal/diaphragmatic breathing exercises.     Time  8    Period  Weeks    Status  New      SLP SHORT TERM GOAL #4   Title  Patient will use compensatory strategies of overarticulation, pacing and increased vocal intensity to improve intelligibility of speech and to repair communication breakdown with 80% accuracy given min verbal and/or visual cues.    Time  8    Period  Weeks    Status  New      SLP SHORT TERM GOAL #5   Title  Patient will complete higher level word finding tasks independently with 90% accuracy.    Time  8    Period  Weeks    Status  New    Target Date  07/08/18      Additional Short Term Goals   Additional Short Term Goals  Yes      SLP SHORT TERM GOAL #6   Title  Patient will be independent with compensatory strategies to assist word finding difficulties in conversation.    Time  8    Period  Weeks    Status  New    Target Date  07/08/18       SLP Long Term Goals - 05/30/18 1733      SLP LONG TERM GOAL #1   Title  Patient will demonstrate clear and appropriate  vocal intensity and intonation for sutained speech at the conversational level to increase intelligibility of spontaneous speech to communicate needs/wants in her functional living environment.    Time  8    Period  Weeks    Status  New       Plan - 06/13/18 1354    Clinical Impression Statement  Patient demonstrated improved pacing when  reading aloud resulting in improved breath support and intelligibility. Noted improved intelligibility and vocal intensity when producing spontaneous speech throughout treatment session.   Speech Therapy Frequency  2x / week    Duration  Other (comment)   8 weeks   Treatment/Interventions  Compensatory strategies;SLP instruction and feedback;Patient/family education;Functional tasks    Potential to Achieve Goals  Good    Potential Considerations  Ability to learn/carryover information;Family/community support    SLP Home Exercise Plan  To include SPEAK OUT voice treatment program exercises, application of compensatory strategies including: speaking with "intent", overarticulation, pacing, increased vocal intensity exercises    Consulted and Agree with Plan of Care  Patient       Patient will benefit from skilled therapeutic intervention in order to improve the following deficits and impairments:   Dysphonia    Problem List Patient Active Problem List   Diagnosis Date Noted  . Confusion 06/21/2017    Manal Kreutzer, MA, CCC-SLP 06/13/2018, 1:57 PM  [facility]IN [facility] [address] [place], [place], [address] Phone: [phone]   Fax:  [phone]   Name: Tina Bowman MRN: [id number] Date of Birth: [birth year]

## 2018-06-17 ENCOUNTER — Encounter: Payer: Self-pay | Admitting: Speech Pathology

## 2018-06-17 ENCOUNTER — Ambulatory Visit: Payer: Medicare HMO | Admitting: Speech Pathology

## 2018-06-17 DIAGNOSIS — R490 Dysphonia: Secondary | ICD-10-CM | POA: Diagnosis not present

## 2018-06-17 NOTE — Therapy (Signed)
[facility] [address] [place], [place], [address] Phone: [phone]   Fax:  [phone]  Speech Language Pathology Treatment  Patient Details  Name: Tina ChickDiane N Bowman MRN: [id number] Date of Birth: June 16, 1946 Referring Provider: Carroll SageHemeng Shah   Encounter Date: 06/17/2018  End of Session - 06/17/18 1659    Visit Number  8    Number of Visits  17    Date for SLP Re-Evaluation  07/08/18    SLP Start Time  1500    SLP Stop Time   1550    SLP Time Calculation (min)  50 min    Activity Tolerance  Patient tolerated treatment well       Past Medical History:  Diagnosis Date  . Asthma   . COPD (chronic obstructive pulmonary disease) (HCC)   . Fibromyalgia   . Heart disease   . Hypertension   . 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  . Slurred speech    foggy minded    Past Surgical History:  Procedure Laterality Date  . ABDOMINAL HYSTERECTOMY    . APPENDECTOMY    . BACK SURGERY    . BREAST EXCISIONAL BIOPSY Left    2000's  . CATARACT EXTRACTION     Also, retina surgery  . CHOLECYSTECTOMY    . SHOULDER SURGERY      There were no vitals filed for this visit.  Subjective Assessment - 06/17/18 1657    Subjective  Patient reported she rested a lot this week and is now behind on everything because of all the rain!    Currently in Pain?  No/denies            ADULT SLP TREATMENT - 06/17/18 0001      General Information   Behavior/Cognition  Alert;Cooperative;Pleasant mood    Patient Positioning  Upright in chair    HPI  Per MBSS report: Pt is referred by neurologist Dr. Sherryll BurgerShah due to difficulty with eating both solids and liquids. Pt was recently evaluated by neurology for having lightheadedness and mental fog since before 2017. Pt complains of difficulty with her speech  as well, which she describes as difficulty coming up with what she wants to say. Pt began sinemet following neurology visit which she reports has helped some of her symptoms but not speech. Pt reports she is awaiting test results to see if she has Parkinson's disease.      Treatment Provided   Treatment provided  Cognitive-Linquistic      Pain Assessment   Pain Assessment  No/denies pain      Cognitive-Linquistic Treatment   Treatment focused on  Voice    Skilled Treatment  Patient performed following SPEAK OUT voice treatment program exercises given intermittent min verbal and visual cues: Performed vocal warm-ups while maintaining a minimum vocal intensity of 78 dB. Sustained phonation of /a/ for average of 12 seconds while maintaining a minimum of 80 dB. Increased pitch range using vocal glides while maintaining a minimum of 85 dB. Recited automatic speech sequences while maintaining a minimum of 78 dB. Read sentences while maintaining a minimum of 77 dB. Produced spontaneous conversation while maintaining a minimum of 71 dB. Produced answers to simple cognitive linguistic tasks while maintaining a minimum of 76 dB. Produced answers to complex cognitive linguistic tasks while maintaining a minimum of 76 dB.  Assessment / Recommendations / Plan   Plan  Continue with current plan of care      Progression Toward Goals   Progression toward goals  Progressing toward goals       SLP Education - 06/17/18 1658    Education Details  Compensatory strategies to increase vocal intensity, breath support and intelligibility including pacing, overarticulation and SPEAK OUT voice treatment exercises, speaking with "intent"    Person(s) Educated  Patient    Methods  Explanation;Demonstration;Verbal cues    Comprehension  Verbalized understanding;Returned demonstration;Need further instruction;Verbal cues required       SLP Short Term Goals - 05/30/18 1731      SLP SHORT TERM GOAL #1    Title  Patient will maintain appropriate loudenss when reading short paragraphs aloud given min verbal and visual cues with 90% accuracy.    Time  8    Period  Weeks    Status  New      SLP SHORT TERM GOAL #2   Title  Patient will demonstrate appropriate intonation when reading short paragraphs aloud and when answering wh questions with sentence long responses with 90% accuracy and min verbal cues.    Time  8    Period  Weeks    Status  New      SLP SHORT TERM GOAL #3   Title  Patient will be independent with abdominal/diaphragmatic breathing exercises.     Time  8    Period  Weeks    Status  New      SLP SHORT TERM GOAL #4   Title  Patient will use compensatory strategies of overarticulation, pacing and increased vocal intensity to improve intelligibility of speech and to repair communication breakdown with 80% accuracy given min verbal and/or visual cues.    Time  8    Period  Weeks    Status  New      SLP SHORT TERM GOAL #5   Title  Patient will complete higher level word finding tasks independently with 90% accuracy.    Time  8    Period  Weeks    Status  New    Target Date  07/08/18      Additional Short Term Goals   Additional Short Term Goals  Yes      SLP SHORT TERM GOAL #6   Title  Patient will be independent with compensatory strategies to assist word finding difficulties in conversation.    Time  8    Period  Weeks    Status  New    Target Date  07/08/18       SLP Long Term Goals - 05/30/18 1733      SLP LONG TERM GOAL #1   Title  Patient will demonstrate clear and appropriate vocal intensity and intonation for sutained speech at the conversational level to increase intelligibility of spontaneous speech to communicate needs/wants in her functional living environment.    Time  8    Period  Weeks    Status  New       Plan - 06/17/18 1659    Clinical Impression Statement  Patient demonstrated consistent improved vocal intensity throughout treatment today  for all treatment exercises with emerging independence requiring a decreased level of cueing for use of compensatory strategies.   Speech Therapy Frequency  2x / week    Duration  Other (comment)   8 weeks   Treatment/Interventions  Compensatory strategies;SLP instruction and feedback;Patient/family education;Functional tasks  Potential to Achieve Goals  Good    Potential Considerations  Ability to learn/carryover information;Family/community support    SLP Home Exercise Plan  To include SPEAK OUT voice treatment program exercises, application of compensatory strategies including: speaking with "intent", overarticulation, pacing, increased vocal intensity exercises    Consulted and Agree with Plan of Care  Patient       Patient will benefit from skilled therapeutic intervention in order to improve the following deficits and impairments:   Dysphonia    Problem List Patient Active Problem List   Diagnosis Date Noted  . Confusion 06/21/2017    Tina Centanni, MA, CCC-SLP 06/17/2018, 5:00 PM  [facility] MAIN [facility] SERVICES [address] [place], [place], [address] Phone: [phone]   Fax:  [phone]   Name: Tina Bowman MRN: [id number] Date of Birth: December 18, 1945

## 2018-06-19 ENCOUNTER — Other Ambulatory Visit: Payer: Self-pay

## 2018-06-19 ENCOUNTER — Emergency Department: Payer: Medicare HMO

## 2018-06-19 ENCOUNTER — Encounter: Payer: Self-pay | Admitting: Emergency Medicine

## 2018-06-19 ENCOUNTER — Emergency Department
Admission: EM | Admit: 2018-06-19 | Discharge: 2018-06-19 | Disposition: A | Discharge status: Home / Self Care | Payer: Medicare HMO | Attending: Emergency Medicine | Admitting: Emergency Medicine

## 2018-06-19 DIAGNOSIS — J449 Chronic obstructive pulmonary disease, unspecified: Secondary | ICD-10-CM | POA: Diagnosis not present

## 2018-06-19 DIAGNOSIS — W010XXA Fall on same level from slipping, tripping and stumbling without subsequent striking against object, initial encounter: Secondary | ICD-10-CM | POA: Insufficient documentation

## 2018-06-19 DIAGNOSIS — Z7982 Long term (current) use of aspirin: Secondary | ICD-10-CM | POA: Diagnosis not present

## 2018-06-19 DIAGNOSIS — Z79899 Other long term (current) drug therapy: Secondary | ICD-10-CM | POA: Insufficient documentation

## 2018-06-19 DIAGNOSIS — S42292A Other displaced fracture of upper end of left humerus, initial encounter for closed fracture: Secondary | ICD-10-CM | POA: Diagnosis not present

## 2018-06-19 DIAGNOSIS — I10 Essential (primary) hypertension: Secondary | ICD-10-CM | POA: Diagnosis not present

## 2018-06-19 DIAGNOSIS — Y9222 Religious institution as the place of occurrence of the external cause: Secondary | ICD-10-CM | POA: Insufficient documentation

## 2018-06-19 DIAGNOSIS — Y999 Unspecified external cause status: Secondary | ICD-10-CM | POA: Insufficient documentation

## 2018-06-19 DIAGNOSIS — S4992XA Unspecified injury of left shoulder and upper arm, initial encounter: Secondary | ICD-10-CM | POA: Diagnosis present

## 2018-06-19 DIAGNOSIS — Y9301 Activity, walking, marching and hiking: Secondary | ICD-10-CM | POA: Diagnosis not present

## 2018-06-19 MED ORDER — ONDANSETRON 8 MG PO TBDP
8.0000 mg | ORAL_TABLET | Freq: Once | ORAL | Status: AC
  Administered 2018-06-19: 8 mg via ORAL
  Filled 2018-06-19: qty 1

## 2018-06-19 MED ORDER — FENTANYL CITRATE (PF) 100 MCG/2ML IJ SOLN
100.0000 ug | Freq: Once | INTRAMUSCULAR | Status: AC
  Administered 2018-06-19: 100 ug via INTRAMUSCULAR
  Filled 2018-06-19: qty 2

## 2018-06-19 MED ORDER — HYDROCODONE-ACETAMINOPHEN 5-325 MG PO TABS: 1.0000 | ORAL_TABLET | ORAL | 0 refills | Status: DC | PRN

## 2018-06-19 NOTE — ED Notes (Signed)
Patient educated about not driving or performing other critical tasks (such as operating heavy machinery, caring for infant/toddler/child) due to sedative nature of narcotic medications received while in the ED.  Pt/caregiver verbalized understanding.   

## 2018-06-19 NOTE — ED Triage Notes (Signed)
Patient to ER for c/o left shoulder injury. Patient states she tripped on her shoes at church and landed on left arm. Has abrasion to left elbow with bleeding controlled.

## 2018-06-19 NOTE — ED Provider Notes (Signed)
[facility] Emergency Department Provider Note  ____________________________________________  Time seen: Approximately 9:03 PM  I have reviewed the triage vital signs and the nursing notes.   HISTORY  Chief Complaint Shoulder Injury and Fall    HPI Tina Bowman is a 72 y.o. female who presents the emergency department complaining of left shoulder injury.  Patient reports that she was at church, "tripped over my own feet" fell with an outstretched arm.  Patient reports that her arm was forced backwards during the fall causing sharp pain to the left shoulder.  Patient reports that she has had no range of motion since this injury.  She did not hit her head or lose consciousness.  She reports a mild abrasion to the left elbow that did not bleed at any point.   Patient reports significant pain.  She did not hit her head or lose consciousness.  No other injury or complaint reported at this time.  No medications prior to arrival.   Past Medical History:  Diagnosis Date  . Asthma   . COPD (chronic obstructive pulmonary disease) (HCC)   . Fibromyalgia   . Heart disease   . Hypertension   . 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  . Slurred speech    foggy minded    Patient Active Problem List   Diagnosis Date Noted  . Confusion 06/21/2017    Past Surgical History:  Procedure Laterality Date  . ABDOMINAL HYSTERECTOMY    . APPENDECTOMY    . BACK SURGERY    . BREAST EXCISIONAL BIOPSY Left    2000's  . CATARACT EXTRACTION     Also, retina surgery  . CHOLECYSTECTOMY    . SHOULDER SURGERY      Prior to Admission medications   Medication Sig Start Date End Date Taking? Authorizing Provider  albuterol (PROVENTIL HFA;VENTOLIN HFA) 108 (90 Base) MCG/ACT inhaler Inhale 2 puffs into the lungs every 6 (six) hours as needed  for wheezing or shortness of breath.    [provider]  aspirin 325 MG tablet Take 325 mg by mouth daily.    [provider]  atorvastatin (LIPITOR) 40 MG tablet Take 40 mg by mouth every evening.    [provider]  calcium-vitamin D (OSCAL WITH D) 500-200 MG-UNIT per tablet Take 1 tablet by mouth.    [provider]  enalapril (VASOTEC) 20 MG tablet Take 20 mg by mouth daily.    [provider]  hydrALAZINE (APRESOLINE) 25 MG tablet Take 25 mg by mouth 2 (two) times daily.    [provider]  HYDROcodone-acetaminophen (NORCO/VICODIN) 5-325 MG tablet Take 1 tablet by mouth every 4 (four) hours as needed for moderate pain. 06/19/18   Keyera Hattabaugh, Delorise Royals, PA-C  levalbuterol (XOPENEX) 1.25 MG/3ML nebulizer solution Take 1.25 mg by nebulization every 4 (four) hours as needed for wheezing or shortness of breath.    [provider]  loratadine (CLARITIN) 10 MG tablet Take 10 mg by mouth daily.    [provider]  metoprolol (LOPRESSOR) 50 MG tablet Take 50 mg by mouth 2 (two) times daily.    [provider]  montelukast (SINGULAIR) 10 MG tablet Take 10 mg by mouth at bedtime.    [provider]  omeprazole (PRILOSEC) 40 MG capsule Take 40 mg by mouth daily.    [provider]  ranitidine (ZANTAC) 150 MG tablet Take 150 mg by mouth at bedtime.     [provider]  traMADol (ULTRAM) 50 MG tablet Take by mouth every 6 (six) hours as needed.    [provider]  umeclidinium bromide (INCRUSE ELLIPTA) 62.5 MCG/INH AEPB Inhale 1 puff into the lungs daily.    [provider]    Allergies Methadone; Niacin and related; Oxycontin [oxycodone]; Sulfa antibiotics; and Other  Family History  Problem Relation Age of Onset  . Heart attack Mother   . Heart disease Father   . Breast cancer Cousin   . Breast cancer Maternal Aunt   . Diabetes Maternal Grandfather     Social  History Social History   Tobacco Use  . Smoking status: Never Smoker  . Smokeless tobacco: Never Used  Substance Use Topics  . Alcohol use: Yes    Comment: social  . Drug use: No     Review of Systems  Constitutional: No fever/chills Eyes: No visual changes. No discharge ENT: No upper respiratory complaints. Cardiovascular: no chest pain. Respiratory: no cough. No SOB. Gastrointestinal: No abdominal pain.  No nausea, no vomiting.   Musculoskeletal: Positive for left shoulder injury Skin: Negative for rash, abrasions, lacerations, ecchymosis. Neurological: Negative for headaches, focal weakness or numbness. 10-point ROS otherwise negative.  ____________________________________________   PHYSICAL EXAM:  VITAL SIGNS: ED Triage Vitals  Enc Vitals Group     BP 06/19/18 2037 (!) 139/58     Pulse Rate 06/19/18 2037 80     Resp 06/19/18 2037 18     Temp 06/19/18 2037 98 F (36.7 C)     Temp Source 06/19/18 2037 Oral     SpO2 06/19/18 2037 99 %     Weight 06/19/18 2038 156 lb (70.8 kg)     Height 06/19/18 2038 5\' 2"  (1.575 m)     Head Circumference --      Peak Flow --      Pain Score 06/19/18 2038 10     Pain Loc --      Pain Edu? --      Excl. in GC? --      Constitutional: Alert and oriented. Well appearing and in no acute distress. Eyes: Conjunctivae are normal. PERRL. EOMI. Head: Atraumatic. Neck: No stridor.  No cervical spine tenderness to palpation.  Cardiovascular: Normal rate, regular rhythm. Normal S1 and S2.  Good peripheral circulation. Respiratory: Normal respiratory effort without tachypnea or retractions. Lungs CTAB. Good air entry to the bases with no decreased or absent breath sounds. Musculoskeletal: Full range of motion to all extremities. No gross deformities appreciated.  Visualization of the left shoulder reveals no gross deformity, ecchymosis.  Tissue edema is appreciated.  No range of motion is able to be performed.  Patient is nontender to  palpation along the clavicle or scapula.  Significant tenderness to palpation over the proximal humerus/humeral head.  No palpable abnormality or deficits.  Examination of the left elbow reveals no edema, ecchymosis, deformity.  Full range of motion to the elbow, wrist.  Full range of motion all 5 digits.  Radial pulse intact.  Sensation intact all 5 digits. Neurologic:  Normal speech and language. No gross focal neurologic deficits are appreciated.  Skin:  Skin is warm, dry and intact. No rash noted. Psychiatric: Mood and affect are normal. Speech and behavior are normal. Patient exhibits appropriate insight and judgement.   ____________________________________________   LABS (all labs ordered are listed, but only abnormal results are  displayed)  Labs Reviewed - No data to display ____________________________________________  EKG   ____________________________________________  RADIOLOGY I personally viewed and evaluated these images as part of my medical decision making, as well as reviewing the written report by the radiologist.  I concur with radiologist finding of acute comminuted complex fracture of the left humeral head.  No other visible abnormalities to the shoulder joint.  Dg Shoulder Left  Result Date: 06/19/2018 CLINICAL DATA:  Trip and fall at church EXAM: LEFT SHOULDER - 2+ VIEW COMPARISON:  None. FINDINGS: There is a comminuted minimally displaced fracture of the proximal aspect of the left humerus with horizontal component involving the surgical neck and vertical component extending to involve the greater tuberosity. There is suspected extension of the fracture into the glenohumeral joint. Expected adjacent soft tissue swelling.  No dislocation. Limited visualization of the adjacent thorax is normal. No radiopaque foreign body. IMPRESSION: Acute complex comminuted fracture of the left humeral head. Electronically Signed   By: Simonne ComeJohn  Watts M.D.   On: 06/19/2018 20:55     ____________________________________________    PROCEDURES  Procedure(s) performed:    Procedures    Medications  fentaNYL (SUBLIMAZE) injection 100 mcg (has no administration in time range)  ondansetron (ZOFRAN-ODT) disintegrating tablet 8 mg (has no administration in time range)     ____________________________________________   INITIAL IMPRESSION / ASSESSMENT AND PLAN / ED COURSE  Pertinent labs & imaging results that were available during my care of the patient were reviewed by me and considered in my medical decision making (see chart for details).  Review of the [place] CSRS was performed in accordance of the [facility] prior to dispensing any controlled drugs.      Patient's diagnosis is consistent with comminuted fracture of the left humeral head.  Patient presents with left shoulder injury.  X-ray reveals complex fracture to the left humeral head.  Patient is placed in shoulder immobilizer, referred to orthopedics for further management.. Patient will be discharged home with prescriptions for Vicodin.  Patient is given ED precautions to return to the ED for any worsening or new symptoms.     ____________________________________________  FINAL CLINICAL IMPRESSION(S) / ED DIAGNOSES  Final diagnoses:  Closed fracture of head of left humerus, initial encounter      NEW MEDICATIONS STARTED DURING THIS VISIT:  ED Discharge Orders         Ordered    HYDROcodone-acetaminophen (NORCO/VICODIN) 5-325 MG tablet  Every 4 hours PRN     06/19/18 2120              This chart was dictated using voice recognition software/Dragon. Despite best efforts to proofread, errors can occur which can change the meaning. Any change was purely unintentional.    Lanette HampshireCuthriell, Maddyx Vallie D, PA-C 06/19/18 2120    Rockne MenghiniNorman, Anne-Caroline, MD 06/27/18 1536

## 2018-06-20 ENCOUNTER — Ambulatory Visit: Payer: Medicare HMO | Admitting: Speech Pathology

## 2018-06-27 ENCOUNTER — Ambulatory Visit: Payer: Medicare HMO | Admitting: Speech Pathology

## 2018-07-01 ENCOUNTER — Ambulatory Visit: Payer: Medicare HMO | Admitting: Speech Pathology

## 2018-07-04 ENCOUNTER — Ambulatory Visit: Payer: Medicare HMO | Admitting: Speech Pathology

## 2018-07-08 ENCOUNTER — Ambulatory Visit: Payer: Medicare HMO | Admitting: Speech Pathology

## 2018-07-11 ENCOUNTER — Ambulatory Visit: Payer: Medicare HMO | Admitting: Speech Pathology

## 2018-07-16 ENCOUNTER — Ambulatory Visit: Payer: Medicare HMO | Admitting: Physical Therapy

## 2018-07-18 ENCOUNTER — Ambulatory Visit: Payer: Medicare HMO | Admitting: Physical Therapy

## 2018-07-22 ENCOUNTER — Ambulatory Visit: Payer: Medicare HMO | Admitting: Physical Therapy

## 2018-10-10 ENCOUNTER — Other Ambulatory Visit: Payer: Self-pay | Admitting: Obstetrics and Gynecology

## 2018-10-10 DIAGNOSIS — Z1231 Encounter for screening mammogram for malignant neoplasm of breast: Secondary | ICD-10-CM

## 2018-11-08 ENCOUNTER — Ambulatory Visit
Admission: RE | Admit: 2018-11-08 | Discharge: 2018-11-08 | Disposition: A | Discharge status: Home / Self Care | Payer: Medicare HMO | Source: Ambulatory Visit | Attending: Obstetrics and Gynecology | Admitting: Obstetrics and Gynecology

## 2018-11-08 DIAGNOSIS — Z1231 Encounter for screening mammogram for malignant neoplasm of breast: Secondary | ICD-10-CM | POA: Diagnosis present

## 2019-01-13 ENCOUNTER — Encounter: Payer: Self-pay | Admitting: Speech Pathology

## 2019-01-13 DIAGNOSIS — R490 Dysphonia: Secondary | ICD-10-CM

## 2019-01-13 NOTE — Therapy (Signed)
[place] MAIN [facility] SERVICES [address] [place], [place], [address] Phone: [phone]   Fax:  [phone]  January 13, 2019     Speech Language Pathology Therapy Discharge Summary   Patient: Tina Bowman  MRN: [id number]  Date of Birth: 10-23-1946   Diagnosis: Dysphonia Referring Provider (SLP): Hemeng Manuella Ghazi   The above patient had been seen in Speech Language Pathology 7 times of 7 treatments scheduled with 0 no shows and 0 cancellations.  The treatment consisted of the SPEAK OUT voice treatment exercise program, training to speak with "intent" to increase clarity and effectiveness of functional communication skills,   education and training to use compensatory strategies to increase vocal intensity, breath support and intelligibility including pacing, overarticulation. The patient is: improved since evaluation date; however had to abruptly discontinue attending tx sessions due to a fall and humeral fracture. Subjective: Pt was always very attentive and eager to learn how to improve the clarity and function of her voice.   Discharge Findings: The SPEAK OUT voice treatment program was effective in improving vocal clarity through training patient to use above named compensatory strategies. Min verbal and visual cues were effective in eliciting accurate use of compensatory strategies to improve vocal clarity.  Functional Status at Discharge: Pt demonstrated use of speaking with "intent" and compensatory strategies to improve clarity of her voice with emerging independence, requiring cues approximately 50% of treatment time. Rec. additional treatment sessions to continue to support patient's developing independence. SLP Short Term Goals - 01/13/19 1342      SLP SHORT TERM GOAL #1   Title  Patient will maintain appropriate loudenss when reading short paragraphs aloud given min verbal and visual cues with 90% accuracy.    Baseline 8/26: Maintained  appropriate loudness when reading sentences given min cues w/90% accuracy    Time  8    Period  Weeks    Status  Partially Met      SLP SHORT TERM GOAL #2   Title  Patient will demonstrate appropriate intonation when reading short paragraphs aloud and when answering wh questions with sentence long responses with 90% accuracy and min verbal cues.    Baseline  8/26: Pt required min to mod cues to use appropriate intonation; without cues, responses often monotone    Time  8    Period  Weeks    Status  On-going      SLP SHORT TERM GOAL #3   Title  Patient will be independent with abdominal/diaphragmatic breathing exercises.     Time  8    Period  Weeks    Status  Achieved      SLP SHORT TERM GOAL #4   Title  Patient will use compensatory strategies of overarticulation, pacing and increased vocal intensity to improve intelligibility of speech and to repair communication breakdown with 80% accuracy given min verbal and/or visual cues.    Time  8    Period  Weeks    Status  Achieved      SLP SHORT TERM GOAL #5   Title  Patient will complete higher level word finding tasks independently with 90% accuracy.    Baseline  Met goal x1, need to demonstrate at least x3 to demonstrate consistency    Time  8    Period  Weeks    Status  On-going      SLP SHORT TERM GOAL #6   Title  Patient will be independent with compensatory strategies to  assist word finding difficulties in conversation.    Time  8    Period  Weeks    Status  Not Met       SLP Long Term Goals - 01/13/19 1337      SLP LONG TERM GOAL #1   Title  Patient will demonstrate clear and appropriate vocal intensity and intonation for sutained speech at the conversational level to increase intelligibility of spontaneous speech to communicate needs/wants in her functional living environment.    Time  8    Period  Weeks    Status  Partially met            Sincerely,   Raksha Wolfgang, MA, [place] [address] [place], [place], [address] Phone: [phone]   Fax:  [phone]

## 2019-04-28 ENCOUNTER — Encounter: Payer: Self-pay | Admitting: Physical Therapy

## 2019-04-28 ENCOUNTER — Ambulatory Visit: Payer: Medicare HMO | Attending: Neurology | Admitting: Physical Therapy

## 2019-04-28 ENCOUNTER — Other Ambulatory Visit: Payer: Self-pay

## 2019-04-28 DIAGNOSIS — R2689 Other abnormalities of gait and mobility: Secondary | ICD-10-CM | POA: Insufficient documentation

## 2019-04-28 DIAGNOSIS — M6281 Muscle weakness (generalized): Secondary | ICD-10-CM | POA: Diagnosis present

## 2019-04-28 DIAGNOSIS — R2681 Unsteadiness on feet: Secondary | ICD-10-CM | POA: Diagnosis present

## 2019-04-28 NOTE — Therapy (Signed)
[facility] [address] , [place], [address] Phone: [phone]   Fax:  [phone]  Physical Therapy Evaluation  Patient Details  Name: Tina ChickDiane N Bowman MRN: [id number] Date of Birth: [birth year] Referring Provider (PT): Peyton BottomsMitchell, Kyle Todd   Encounter Date: 04/28/2019  PT End of Session - 04/28/19 1530    Visit Number  1    Number of Visits  17    Date for PT Re-Evaluation  06/23/19    PT Start Time  0320    PT Stop Time  0420    PT Time Calculation (min)  60 min    Equipment Utilized During Treatment  Gait belt    Activity Tolerance  Patient tolerated treatment well    Behavior During Therapy  [facility] for tasks assessed/performed       Past Medical History:  Diagnosis Date  . Asthma   . COPD (chronic obstructive pulmonary disease) (HCC)   . Fibromyalgia   . Heart disease   . Hypertension   . Progressive supranuclear palsy (HCC) 05/23/2018   Pt reported this dx [facility], and replaced with Atypical Parkinsonism..  . Slurred speech    foggy minded    Past Surgical History:  Procedure Laterality Date  . ABDOMINAL HYSTERECTOMY    . APPENDECTOMY    . BACK SURGERY    . BREAST EXCISIONAL BIOPSY Left    2000's  . CATARACT EXTRACTION     Also, retina surgery  . CHOLECYSTECTOMY    . SHOULDER SURGERY      There were no vitals filed for this visit.   Subjective Assessment - 04/28/19 1521    Subjective  Patient wants help with her balance and speech.    Pertinent History  Patient reports that she has been feeling dizzy and it gets worse with head turns and when she is turning in lying down position. Patient has a pain implant in her back that she had put in 2009. She has back pain and recently dx with parkinsonism. She has had 2 falls over a year ago. She does not walk with a assisted device.She has COPD that causes her to sometimes have shortness of  breath.    Currently in Pain?  Yes    Pain Score  8     Pain Location  Back    Pain Orientation  Lower    Pain Descriptors / Indicators  Aching    Pain Type  Chronic pain    Pain Onset  More than a month ago    Pain Frequency  Constant    Aggravating Factors   nothing    Pain Relieving Factors  sitting down, lying down    Effect of Pain on Daily Activities  slower to move    Multiple Pain Sites  No         [facility] PT Assessment - 04/28/19 1523      Assessment   Medical Diagnosis  parkisonism    Referring Provider (PT)  Peyton BottomsMitchell, Kyle Todd    Onset Date/Surgical Date  04/22/18    Hand Dominance  Right    Prior Therapy  no      Precautions   Precautions  None      Restrictions   Weight Bearing Restrictions  No      Balance Screen   Has the patient fallen in the past 6 months  Yes  How many times?  0    Has the patient had a decrease in activity level because of a fear of falling?   Yes    Is the patient reluctant to leave their home because of a fear of falling?   No      Home Environment   Living Environment  Private residence    Living Arrangements  Spouse/significant other    Available Help at Discharge  Family    Type of Home  House    Home Access  Stairs to enter    Entrance Stairs-Number of Steps  6    Entrance Stairs-Rails  Right;Left;Can reach both    Home Layout  One level    Home Equipment  None      Prior Function   Level of Independence  Independent;Independent with basic ADLs;Independent with household mobility without device;Independent with community mobility without device    Vocation  Retired    Leisure  read      Cognition   Overall Cognitive Status  History of cognitive impairments - at baseline        PAIN: 8/10   POSTURE:WNL    PROM/AROM:   WFL BUE and BLE  STRENGTH:  Graded on a 0-5 scale Muscle Group Left Right                          Hip Flex 4/5 4/5  Hip Abd 4/5 4/5  Hip Add 4/5 4/5  Hip Ext 4/5 4/5  Hip IR/ER 4/5  4/5  Knee Flex 4/5 4/5  Knee Ext 4/5 4/5  Ankle DF 4/5 4/5  Ankle PF 4/5 4/5   SENSATION: WNL    FUNCTIONAL MOBILITY: slow rolling left and right and slow sit to supine   BALANCE: Static Standing Balance  Normal Able to maintain standing balance against maximal resistance   Good Able to maintain standing balance against moderate resistance   Good-/Fair+ Able to maintain standing balance against minimal resistance x  Fair Able to stand unsupported without UE support and without LOB for 1-2 min   Fair- Requires Min A and UE support to maintain standing without loss of balance   Poor+ Requires mod A and UE support to maintain standing without loss of balance   Poor Requires max A and UE support to maintain standing balance without loss    Standing Dynamic Balance  Normal Stand independently unsupported, able to weight shift and cross midline maximally   Good Stand independently unsupported, able to weight shift and cross midline moderately   Good-/Fair+ Stand independently unsupported, able to weight shift across midline minimally x  Fair Stand independently unsupported, weight shift, and reach ipsilaterally, loss of balance when crossing midline   Poor+ Able to stand with Min A and reach ipsilaterally, unable to weight shift   Poor Able to stand with Mod A and minimally reach ipsilaterally, unable to cross midline.       GAIT: Ambulates without AD and has decreased gait speed   OUTCOME MEASURES: TEST Outcome Interpretation  5 times sit<>stand 21.23sec >60 yo, >15 sec indicates increased risk for falls  10 meter walk test     .89            m/s <1.0 m/s indicates increased risk for falls; limited community ambulator  Timed up and Go      16.90           sec <14 sec indicates increased risk  for falls  6 minute walk test   900             Feet 1000 feet is community ambulator            Treatment: Sit to stand x 10  Walking in hall with head turns x 100 feet x 4   Patient  reports feeling dizzy with head turns     Objective measurements completed on examination: See above findings.              PT Education - 04/28/19 1529    Education Details  plan of care    Person(s) Educated  Patient    Methods  Explanation    Comprehension  Verbalized understanding       PT Short Term Goals - 04/28/19 1606      PT SHORT TERM GOAL #1   Title  Patient will be independent in home exercise program to improve strength/mobility for better functional independence with ADLs.    Time  4    Period  Weeks    Status  New    Target Date  05/26/19      PT SHORT TERM GOAL #2   Title  Patient (> 10 years old) will complete five times sit to stand test in < 15 seconds indicating an increased LE strength and improved balance.    Time  4    Period  Weeks    Status  New    Target Date  05/26/19        PT Long Term Goals - 04/28/19 1608      PT LONG TERM GOAL #1   Title  Patient will increase six minute walk test distance to >1000 for progression to community ambulator and improve gait ability    Time  8    Period  Weeks    Status  New    Target Date  06/23/19      PT LONG TERM GOAL #2   Title  Patient will increase 10 meter walk test to >1.65m/s as to improve gait speed for better community ambulation and to reduce fall risk.    Time  8    Period  Weeks    Status  New    Target Date  06/23/19      PT LONG TERM GOAL #3   Title  Patient will reduce timed up and go to <11 seconds to reduce fall risk and demonstrate improved transfer/gait ability.    Time  8    Period  Weeks    Status  New    Target Date  06/23/19             Plan - 04/28/19 1533    Clinical Impression Statement  Patient has Dx of parkinsonism and has unsteady gait. she has weakness in BLE and she has back pain that is 8/10. She has a falls risk indicated by outcome meausres. She will benefit from skilled PT to improve strength, balance and decrease her falls risk     Stability/Clinical Decision Making  Stable/Uncomplicated    Clinical Decision Making  Low    Rehab Potential  Good    PT Frequency  2x / week    PT Duration  8 weeks    PT Treatment/Interventions  Therapeutic activities;Therapeutic exercise;Neuromuscular re-education;Patient/family education    Consulted and Agree with Plan of Care  Patient       Patient will benefit from skilled therapeutic intervention in order to  improve the following deficits and impairments:  Decreased cognition, Decreased endurance, Decreased strength, Pain, Decreased balance  Visit Diagnosis: 1. Muscle weakness (generalized)   2. Gait instability   3. Other abnormalities of gait and mobility        Problem List Patient Active Problem List   Diagnosis Date Noted  . Confusion 06/21/2017    Ezekiel InaMansfield, Kazden Largo S, PT DPT 04/28/2019, 4:27 PM  [facility] [address] [place], [place], [address] Phone: [phone]   Fax:  [phone]  Name: Tina ChickDiane N Bowman MRN: [id number] Date of Birth: [birth year]

## 2019-04-30 ENCOUNTER — Ambulatory Visit: Payer: Medicare HMO | Admitting: Physical Therapy

## 2019-05-05 ENCOUNTER — Ambulatory Visit: Payer: Medicare HMO | Admitting: Physical Therapy

## 2019-05-07 ENCOUNTER — Ambulatory Visit: Payer: Medicare HMO | Admitting: Physical Therapy

## 2019-05-12 ENCOUNTER — Ambulatory Visit: Payer: Medicare HMO | Admitting: Physical Therapy

## 2019-05-14 ENCOUNTER — Ambulatory Visit: Payer: Medicare HMO | Admitting: Physical Therapy

## 2019-05-19 ENCOUNTER — Ambulatory Visit: Payer: Medicare HMO

## 2019-05-26 ENCOUNTER — Ambulatory Visit: Payer: Medicare HMO | Admitting: Physical Therapy

## 2019-05-28 ENCOUNTER — Ambulatory Visit: Payer: Medicare HMO | Admitting: Physical Therapy

## 2019-11-23 ENCOUNTER — Encounter: Payer: Self-pay | Admitting: Emergency Medicine

## 2019-11-23 ENCOUNTER — Other Ambulatory Visit: Payer: Self-pay

## 2019-11-23 ENCOUNTER — Emergency Department
Admission: EM | Admit: 2019-11-23 | Discharge: 2019-11-23 | Disposition: A | Discharge status: Home / Self Care | Payer: Medicare HMO | Attending: Emergency Medicine | Admitting: Emergency Medicine

## 2019-11-23 ENCOUNTER — Emergency Department: Payer: Medicare HMO

## 2019-11-23 DIAGNOSIS — J45909 Unspecified asthma, uncomplicated: Secondary | ICD-10-CM | POA: Insufficient documentation

## 2019-11-23 DIAGNOSIS — I10 Essential (primary) hypertension: Secondary | ICD-10-CM | POA: Diagnosis not present

## 2019-11-23 DIAGNOSIS — Z79899 Other long term (current) drug therapy: Secondary | ICD-10-CM | POA: Diagnosis not present

## 2019-11-23 DIAGNOSIS — J449 Chronic obstructive pulmonary disease, unspecified: Secondary | ICD-10-CM | POA: Diagnosis not present

## 2019-11-23 DIAGNOSIS — Z7982 Long term (current) use of aspirin: Secondary | ICD-10-CM | POA: Diagnosis not present

## 2019-11-23 DIAGNOSIS — R2242 Localized swelling, mass and lump, left lower limb: Secondary | ICD-10-CM | POA: Diagnosis present

## 2019-11-23 DIAGNOSIS — M7989 Other specified soft tissue disorders: Secondary | ICD-10-CM

## 2019-11-23 NOTE — ED Provider Notes (Signed)
[facility] Emergency Department Provider Note   ____________________________________________   First MD Initiated Contact with Patient 11/23/19 [phone]     (approximate)  I have reviewed the triage vital signs and the nursing notes.   HISTORY  Chief Complaint Leg Swelling    HPI Tina AMEY is a 74 y.o. female history of COPD hypertension  Patient presents for evaluation of slightly tight or swollen feeling in the left lower leg.  No fevers or chills.  No exposure to Covid.  Denies numb or loss of sensation in the leg.  No issues with the right leg.  No chest pain or shortness of breath.  No history of blood clots.  Symptoms started yesterday, feels like a slight tight or swollen feeling in the left lower leg mostly in the left lower calf and across the top of the foot.  No trauma, she did have a fall couple weeks ago while cleaning but denies that she injured her left lower leg.   Past Medical History:  Diagnosis Date  . Asthma   . COPD (chronic obstructive pulmonary disease) (HCC)   . Fibromyalgia   . Heart disease   . Hypertension   . 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  . Slurred speech    foggy minded    Patient Active Problem List   Diagnosis Date Noted  . Confusion 06/21/2017    Past Surgical History:  Procedure Laterality Date  . ABDOMINAL HYSTERECTOMY    . APPENDECTOMY    . BACK SURGERY    . BREAST EXCISIONAL BIOPSY Left    2000's  . CATARACT EXTRACTION     Also, retina surgery  . CHOLECYSTECTOMY    . SHOULDER SURGERY      Prior to Admission medications   Medication Sig Start Date End Date Taking? Authorizing Provider  albuterol (PROVENTIL HFA;VENTOLIN HFA) 108 (90 Base) MCG/ACT inhaler Inhale 2 puffs into the lungs every 6 (six) hours as needed for wheezing or shortness of  breath.    [provider]  aspirin 325 MG tablet Take 325 mg by mouth daily.    [provider]  atorvastatin (LIPITOR) 40 MG tablet Take 40 mg by mouth every evening.    [provider]  calcium-vitamin D (OSCAL WITH D) 500-200 MG-UNIT per tablet Take 1 tablet by mouth.    [provider]  enalapril (VASOTEC) 20 MG tablet Take 20 mg by mouth daily.    [provider]  hydrALAZINE (APRESOLINE) 25 MG tablet Take 25 mg by mouth 2 (two) times daily.    [provider]  HYDROcodone-acetaminophen (NORCO/VICODIN) 5-325 MG tablet Take 1 tablet by mouth every 4 (four) hours as needed for moderate pain. 06/19/18   Cuthriell, Delorise Royals, PA-C  levalbuterol (XOPENEX) 1.25 MG/3ML nebulizer solution Take 1.25 mg by nebulization every 4 (four) hours as needed for wheezing or shortness of breath.    [provider]  loratadine (CLARITIN) 10 MG tablet Take 10 mg by mouth daily.    [provider]  metoprolol (LOPRESSOR) 50 MG tablet Take 50 mg by mouth 2 (two) times daily.    [provider]  montelukast (SINGULAIR) 10 MG tablet Take 10 mg by mouth at bedtime.    [provider]  omeprazole (PRILOSEC) 40 MG capsule Take 40 mg by mouth daily.    [provider]  ranitidine (ZANTAC) 150 MG tablet Take 150 mg by mouth at bedtime.     [provider]  traMADol (ULTRAM) 50 MG tablet Take by mouth every 6 (six) hours as needed.    [provider]  umeclidinium bromide (INCRUSE ELLIPTA) 62.5 MCG/INH AEPB Inhale 1 puff into the lungs daily.    [provider]    Allergies Methadone, Niacin and related, Oxycontin [oxycodone], Sulfa antibiotics, and Other  Family History  Problem Relation Age of Onset  . Heart attack Mother   . Heart disease Father   . Breast cancer Cousin   . Breast cancer Maternal Aunt   . Diabetes Maternal Grandfather     Social History Social History   Tobacco Use   . Smoking status: Never Smoker  . Smokeless tobacco: Never Used  Substance Use Topics  . Alcohol use: Yes    Comment: social  . Drug use: No    Review of Systems Constitutional: No fever/chills or exposure to Covid Eyes: No visual changes.   Cardiovascular: Denies chest pain. Respiratory: Denies shortness of breath. Gastrointestinal: No abdominal pain.     Musculoskeletal: Negative for back pain.  See HPI Skin: Negative for rash left lower leg somewhat swollen. Neurological: Negative for headaches, areas of focal weakness or numbness.    ____________________________________________   PHYSICAL EXAM:  VITAL SIGNS: ED Triage Vitals [11/23/19 0920]  Enc Vitals Group     BP (!) 171/65     Pulse Rate (!) 105     Resp 16     Temp 98.4 F (36.9 C)     Temp Source Oral     SpO2 95 %     Weight 170 lb (77.1 kg)     Height 5\' 2"  (1.575 m)     Head Circumference      Peak Flow      Pain Score 7     Pain Loc      Pain Edu?      Excl. in GC?     Constitutional: Alert and oriented. Well appearing and in no acute distress.  Very pleasant. Eyes: Conjunctivae are normal. Head: Atraumatic. Nose: No congestion/rhinnorhea. Mouth/Throat: Mucous membranes are moist. Neck: No stridor.  Cardiovascular: Normal rate, regular rhythm. Grossly normal heart sounds.  Good peripheral circulation. Respiratory: Normal respiratory effort.  No retractions. Lungs CTAB. Gastrointestinal: Soft and nontender. No distention. Musculoskeletal:  Lower Extremities  No edema. Normal DP/PT pulses left lower with good cap refill.  Normal neuro-motor function lower extremities bilateral.  RIGHT Right lower extremity demonstrates normal strength, good use of all muscles. No edema bruising or contusions of the right hip, right knee, right ankle. Full range of motion of the right lower extremity without pain. No pain on axial loading. No evidence of trauma.  LEFT Left lower extremity demonstrates  normal strength, good use of all muscles. No edema bruising or contusions of the hip,  knee, ankle. Full range of motion of the left lower extremity without pain. No pain on axial loading. No evidence of trauma.  Normal capillary refill.  She does have some very slight very 1+ pitting edema but slightly more notable in the left calf and leg.      Neurologic:  Normal speech and language. No gross focal neurologic deficits are appreciated.  Skin:  Skin is warm, dry and intact. No rash noted. Psychiatric: Mood and affect are normal. Speech and behavior are normal.  ____________________________________________   LABS (all labs ordered  are listed, but only abnormal results are displayed)  Labs Reviewed - No data to display ____________________________________________  EKG   ____________________________________________  RADIOLOGY  US Venous Img Lower Unilateral Left  Result Date: 11/23/2019 CLINICAL DATA:  Swelling.  Rule out DVT. EXAM: LEFT LOWER EXTREMITY VENOUS DOPPLER ULTRASOUND TECHNIQUE: Gray-scale sonography with compression, as well as color and duplex ultrasound, were performed to evaluate the deep venous system(s) from the level of the common femoral vein through the popliteal and proximal calf veins. COMPARISON:  None. FINDINGS: VENOUS Normal compressibility of the common femoral, superficial femoral, and popliteal veins, as well as the visualized calf veins. Visualized portions of profunda femoral vein and great saphenous vein unremarkable. No filling defects to suggest DVT on grayscale or color Doppler imaging. Doppler waveforms show normal direction of venous flow, normal respiratory phasicity and response to augmentation. Limited views of the contralateral common femoral vein are unremarkable. OTHER None. Limitations: none IMPRESSION: No femoropopliteal DVT nor evidence of DVT within the visualized calf veins. If clinical symptoms are inconsistent or if there are persistent or  worsening symptoms, further imaging (possibly involving the iliac veins) may be warranted. Electronically Signed   By: Dorise Bullion III M.D   On: 11/23/2019 10:26     reviewed negative for DVT. ____________________________________________   PROCEDURES  Procedure(s) performed: None  Procedures  Critical Care performed: No  ____________________________________________   INITIAL IMPRESSION / ASSESSMENT AND PLAN / ED COURSE  Pertinent labs & imaging results that were available during my care of the patient were reviewed by me and considered in my medical decision making (see chart for details).   Patient was for evaluation of atraumatic left lower leg pain she describes a swollen feeling.  Very reassuring neurologic and vascular examination.  Chest slight edema left lower extremity.  Will rule out DVT.  No signs of Baker's cyst, infection, cellulitis, neurologic deficit, or vascular emergency.     ----------------------------------------- 10:42 AM on 11/23/2019 -----------------------------------------  Imaging reviewed negative for DVT.  Slight left ankle edema, no angioedema noted to suggest an etiology such as reaction to her enalapril, and I do not see signs or symptoms suggest DVT that would be pelvic in nature given the location of her discomfort and swelling  Assisted patient to use the commode, she is able to walk independently.  Understanding of ultrasound results.  Agreeable to close follow-up.  Discussed careful return precautions and general recommendations such as trial use of compression stockings, elevating the extremity in the evening and when sitting.  Return precautions and treatment recommendations and follow-up discussed with the patient who is agreeable with the plan.  ____________________________________________   FINAL CLINICAL IMPRESSION(S) / ED DIAGNOSES  Final diagnoses:  Left leg swelling        Note:  This document was prepared using Dragon  voice recognition software and may include unintentional dictation errors       Delman Kitten, MD 11/23/19 1110

## 2019-11-23 NOTE — ED Triage Notes (Signed)
Pt to ED via POV c/o left leg swelling since last night. Pt denies hx/o DVT. Pt states that leg is painful. Pt is in NAD.

## 2019-12-23 ENCOUNTER — Ambulatory Visit: Payer: Medicare HMO | Admitting: Physical Therapy

## 2019-12-24 ENCOUNTER — Other Ambulatory Visit: Payer: Self-pay | Admitting: Obstetrics and Gynecology

## 2019-12-24 DIAGNOSIS — Z1231 Encounter for screening mammogram for malignant neoplasm of breast: Secondary | ICD-10-CM

## 2019-12-26 ENCOUNTER — Other Ambulatory Visit: Payer: Self-pay

## 2019-12-26 ENCOUNTER — Ambulatory Visit: Payer: Medicare HMO | Attending: Otolaryngology

## 2019-12-26 DIAGNOSIS — M6281 Muscle weakness (generalized): Secondary | ICD-10-CM | POA: Diagnosis present

## 2019-12-26 DIAGNOSIS — R2689 Other abnormalities of gait and mobility: Secondary | ICD-10-CM | POA: Diagnosis present

## 2019-12-26 NOTE — Therapy (Signed)
[facility] MAIN [facility] SERVICES [address] [place], [place], [address] Phone: [phone]   Fax:  [phone]  Physical Therapy Evaluation  Patient Details  Name: Tina Bowman MRN: [id number] Date of Birth: [birth year] Referring Provider (PT): Vaught, creighton   Encounter Date: 12/26/2019  PT End of Session - 12/26/19 0919    Visit Number  1    Number of Visits  25    Date for PT Re-Evaluation  03/19/20    PT Start Time  0900    PT Stop Time  0955    PT Time Calculation (min)  55 min    Equipment Utilized During Treatment  Gait belt    Activity Tolerance  Patient tolerated treatment well    Behavior During Therapy  Flat affect       Past Medical History:  Diagnosis Date  . Asthma   . COPD (chronic obstructive pulmonary disease) (HCC)   . Fibromyalgia   . Heart disease   . Hypertension   . 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  . Slurred speech    foggy minded    Past Surgical History:  Procedure Laterality Date  . ABDOMINAL HYSTERECTOMY    . APPENDECTOMY    . BACK SURGERY    . BREAST EXCISIONAL BIOPSY Left    2000's  . CATARACT EXTRACTION     Also, retina surgery  . CHOLECYSTECTOMY    . SHOULDER SURGERY      There were no vitals filed for this visit.   Subjective Assessment - 12/26/19 0914    Subjective  Vertigo, dizziness, and unsteadiness    Pertinent History  Pt reports that she has had general "whooziness all the time." However approximately 1 year ago she started experiencing vertigo when she rolls onto her left side in bed. She has atypical parkinsonism and sees neurologist Dr. Lesia Sago in [place]. Her history was felt to be somewhat unreliable and contradictory at times during interview. She has COPD that causes her to sometimes have shortness of breath. She had a VNG  study at [facility] but reports that she doesn't know the results and doesn't have a follow-up scheduled.    Diagnostic tests  MRI in 2018 WNL    Patient Stated Goals  Improve whooziness, unsteadiness, and vertigo    Currently in Pain?  No/denies    Pain Onset  --        VESTIBULAR AND BALANCE EVALUATION   HISTORY:  Subjective history of current problem: Pt reports that she has had general "whooziness all the time." However approximately 1 year ago she started experiencing vertigo when she rolls onto her left side in bed. She has atypical parkinsonism and sees neurologist Dr. Lesia Sago in [place]. Her history was felt to be somewhat unreliable and contradictory at times during interview. She has COPD that causes her to sometimes have shortness of breath. She had a VNG study at [facility] but reports that she doesn't know the results and doesn't have a follow-up scheduled.  Description of dizziness: (vertigo, unsteadiness, lightheadedness, falling, general unsteadiness, whoozy, swimmy-headed sensation, aural fullness) unsteadiness, whoozy, veritgo Frequency: daily every time she lays down onto her left side Duration: "about a minute" Symptom nature: (motion provoked, positional, spontaneous, constant, variable, intermittent) motion-provoked  Provocative Factors: laying on L side, rolling over Easing Factors: waiting for symptoms  to pass  Progression of symptoms: (better, worse, no change since onset) unchanged History of similar episodes: Pt reports similar episode years ago  Falls (yes/no): Yes Number of falls in past 6 months: once  Prior Functional Level: Independent with ADLs, assist from husband with IADLs  Auditory complaints (tinnitus, pain, drainage, hearing loss, aural fullness): None Vision (diplopia, visual field loss, recent changes, last eye exam): None  Red Flags: Denies dysphagia, drop attacks, bowel and bladder changes, recent weight loss. Confirms slurred speech  x 1 year, 22# weight gain over last 2 years.     EXAMINATION  POSTURE:   NEUROLOGICAL SCREEN: (2+ unless otherwise noted.) N=normal  Ab=abnormal  Level Dermatome R L Myotome R L Reflex R L  C3 Anterior Neck N N Sidebend C2-3 N N Jaw CN V    C4 Top of Shoulder N N Shoulder Shrug C4 N N Hoffman's UMN    C5 Lateral Upper Arm N N Shoulder ABD C4-5 N N Biceps C5-6    C6 Lateral Arm/ Thumb N N Arm Flex/ Wrist Ext C5-6 N N Brachiorad. C5-6    C7 Middle Finger N N Arm Ext//Wrist Flex C6-7 N N Triceps C7    C8 4th & 5th Finger N N Flex/ Ext Carpi Ulnaris C8 N N Patellar (L3-4)    T1 Medial Arm N N Interossei T1 N N Gastrocnemius    L2 Medial thigh/groin N N Illiopsoas (L2-3) N N     L3 Lower thigh/med.knee N N Quadriceps (L3-4) N N     L4 Medial leg/lat thigh N N Tibialis Ant (L4-5) N N     L5 Lat. leg & dorsal foot N N EHL (L5) N N     S1 post/lat foot/thigh/leg N N Gastrocnemius (S1-2) N N     S2 Post./med. thigh & leg N N Hamstrings (L4-S3) N N       Cranial Nerves Pt with significant limitation in vertical gaze bilaterally Facial sensation is intact bilaterally  Facial strength is intact bilaterally  Hearing is normal as tested by gross conversation Shoulder shrug strength is intact  Tongue protrudes midline    SOMATOSENSORY:         Sensation           Intact      Diminished         Absent  Light touch Normal      COORDINATION: Deferred   MUSCULOSKELETAL SCREEN: Cervical Spine ROM: Moderate to severe limitation in cervical ROM in all directions, painless   ROM: UE/LE grossly WFL  MMT: UE/LE grossly WFL, notable functional weakness with gait and transfers  Functional Mobility: Ambulates with short, shuffling steps, slow speed. Noted imbalance. No assistive device utilized   POSTURAL CONTROL TESTS: Deferred  OCULOMOTOR / VESTIBULAR TESTING:  Oculomotor Exam- Room Light  Findings Comments  Ocular Alignment normal   Ocular ROM abnormal Limited upward gaze noted  bilaterally  Spontaneous Nystagmus normal   Gaze-Holding Nystagmus abnormal Possible R horizontal beating nystagmus with R gaze  End-Gaze Nystagmus abnormal See above  Vergence (normal 2-3") not examined   Smooth Pursuit abnormal Saccadic  Cross-Cover Test not examined   Saccades abnormal Slow vertically within limited range  VOR Cancellation normal   Left Head Impulse normal   Right Head Impulse normal   Static Acuity not examined   Dynamic Acuity not examined     Oculomotor Exam- Fixation Suppressed: Deferred testing  BPPV TESTS: (Performed Dix-Hallpike twice bilaterally)  Symptoms Duration Intensity  Nystagmus  L Dix-Hallpike None   None  R Dix-Hallpike None   None  L Head Roll None   None  R Head Roll None   None  L Sidelying Test      R Sidelying Test        FUNCTIONAL OUTCOME MEASURES   Results Comments  TUG 18.0 seconds Increased fall risk  5TSTS 16.0 seconds Increased fall risk  10 Meter Gait Speed Self-selected: 13.5s = 0.74 m/s;  Below normative values for full community ambulation                 [facility] PT Assessment - 12/26/19 0911      Assessment   Medical Diagnosis  Dizziness    Referring Provider (PT)  Vaught, creighton    Onset Date/Surgical Date  12/26/18   Approximate   Hand Dominance  Right    Next MD Visit  No follow-up scheduled with ENT    Prior Therapy  No      Precautions   Precautions  Fall      Restrictions   Weight Bearing Restrictions  No      Balance Screen   Has the patient fallen in the past 6 months  Yes    How many times?  1    Has the patient had a decrease in activity level because of a fear of falling?   Yes    Is the patient reluctant to leave their home because of a fear of falling?   Yes      [place]  Private residence    Living Arrangements  Spouse/significant other    Available Help at Discharge  Family    Type of [place] to enter    Entrance  Stairs-Number of Steps  7    Entrance Stairs-Rails  Right;Left;Cannot reach both    [place]  One level    [place] - 2 wheels;[place] - single point      Prior Function   Level of Independence  Needs assistance with homemaking    Vocation  Retired    [employer]  Retired Designer, television/film set    Leisure  Read      Cognition   Overall Cognitive Status  History of cognitive impairments - at baseline                Objective measurements completed on examination: See above findings.              PT Education - 12/26/19 1236    Education Details  Plan of care    Person(s) Educated  Patient    Methods  Explanation    Comprehension  Verbalized understanding       PT Short Term Goals - 12/26/19 1255      PT SHORT TERM GOAL #1   Title  Patient will be independent in home exercise program to improve strength/mobility for better functional independence with ADLs.    Time  6    Period  Weeks    Status  New    Target Date  02/06/20        PT Long Term Goals - 12/26/19 1256      PT LONG TERM GOAL #1   Title  Pt will report no further episodes of vertigo when rolling over in bed or laying down to improve symptom-free function at home    Time  12  Period  Weeks    Status  New    Target Date  03/19/20      PT LONG TERM GOAL #2   Title  Patient will increase self-selected 10 meter walk test by at least 0.13 m/s as to improve gait speed for better community ambulation and to reduce fall risk.    Baseline  12/26/19: 0.74 m/s    Time  12    Period  Weeks    Status  New    Target Date  03/19/20      PT LONG TERM GOAL #3   Title  Patient will reduce timed up and go to <14 seconds to reduce fall risk and demonstrate improved transfer/gait ability.    Baseline  12/26/19: 18.0s    Time  12    Period  Weeks    Status  New    Target Date  03/19/20      PT LONG TERM GOAL #4   Title  Pt will decrease 5TSTS by at least 3 seconds in order to demonstrate  clinically significant improvement in LE strength.    Baseline  12/26/19: 16.0s    Time  12    Period  Weeks    Status  New    Target Date  03/19/20             Plan - 12/26/19 1253    Clinical Impression Statement  Pt is a pleasant 74 year-old female referred for dizziness. She has general unsteadiness as well as continual feelings of "whooziness." She also complains of separate episodes of vertigo which occur for approximately 2 minutes when rolling in bed or laying down at night. Repeated Dix-Hallpike twice on each side as well as roll test which are negative bilaterally. 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 She does certainly have decreased vertical gaze bilaterally with saccadic smooth pursuit and abnormal vertical saccades within her limited range. She also appears to have some R horizontal beating nystagmus with R gaze. Her gait is slow with short shuffling steps. Abnormal 5TSTS and TUG tests which indicate pt is a higher fall risk. Her history is suggestive of BPPV but unable to provoke vertigo with all testing today. Will continue to test as appropriate and if she ever does have a positive test will treat accordingly. Otherwise will proceed with balance and LE strength interventions. Pt will benefit from skilled PT services to address deficits in balance and decrease risk for future falls.    Personal Factors and Comorbidities  Age;Comorbidity 3+;Time since onset of injury/illness/exacerbation;Past/Current Experience    Comorbidities  atypical parkinsonism, COPD, fibromyalgia    Examination-Activity Limitations  Bend;Lift;Reach Overhead;Squat;Stairs;Stand    Examination-Participation Restrictions  Community Activity;Shop;Volunteer    Stability/Clinical Decision Making  Unstable/Unpredictable    Clinical Decision Making  High    Rehab Potential  Fair    PT Frequency  2x / week    PT Duration   12 weeks    PT Treatment/Interventions  Therapeutic activities;Therapeutic exercise;Neuromuscular re-education;Patient/family education;ADLs/Self Care Home Management;Biofeedback;Canalith Repostioning;Cryotherapy;Electrical Stimulation;Iontophoresis 4mg /ml Dexamethasone;Moist Heat;Aquatic Therapy;Traction;Ultrasound;DME Instruction;Gait training;Stair training;Balance training;Manual techniques;Dry needling;Passive range of motion;Vestibular;Spinal Manipulations;Joint Manipulations    PT Next Visit Plan  Retest for BPPV and treat if indicated, BERG, initiate HEP and balance/strength exercises    PT Home Exercise Plan  None currently    Consulted and Agree with Plan of Care  Patient  Patient will benefit from skilled therapeutic intervention in order to improve the following deficits and impairments:  Decreased cognition, Decreased strength, Decreased balance, Decreased endurance, Dizziness  Visit Diagnosis: Muscle weakness (generalized)  Other abnormalities of gait and mobility     Problem List Patient Active Problem List   Diagnosis Date Noted  . Confusion 06/21/2017   Lynnea Maizes PT, DPT, GCS  Jenya Putz 12/26/2019, 1:04 PM  [facility] MAIN [facility] SERVICES [address] [place], [place], [address] Phone: [phone]   Fax:  [phone]  Name: LENNYN GANGE MRN: [id number] Date of Birth: 1946-03-31

## 2019-12-30 ENCOUNTER — Encounter: Payer: Medicare HMO | Admitting: Physical Therapy

## 2020-01-02 ENCOUNTER — Ambulatory Visit: Payer: Medicare HMO

## 2020-01-02 ENCOUNTER — Other Ambulatory Visit: Payer: Self-pay

## 2020-01-02 DIAGNOSIS — M6281 Muscle weakness (generalized): Secondary | ICD-10-CM | POA: Diagnosis not present

## 2020-01-02 DIAGNOSIS — R2689 Other abnormalities of gait and mobility: Secondary | ICD-10-CM

## 2020-01-02 NOTE — Therapy (Signed)
[facility] MAIN [facility] SERVICES [address] [place], [place], [address] Phone: [phone]   Fax:  [phone]  Physical Therapy Treatment  Patient Details  Name: DEMETRIA IWAI MRN: [id number] Date of Birth: [birth year] Referring Provider (PT): Vaught, creighton   Encounter Date: 01/02/2020  PT End of Session - 01/02/20 0941    Visit Number  2    Number of Visits  25    Date for PT Re-Evaluation  03/19/20    PT Start Time  1002    PT Stop Time  1045    PT Time Calculation (min)  43 min    Equipment Utilized During Treatment  Gait belt    Activity Tolerance  Patient tolerated treatment well    Behavior During Therapy  Flat affect       Past Medical History:  Diagnosis Date  . Asthma   . COPD (chronic obstructive pulmonary disease) (HCC)   . Fibromyalgia   . Heart disease   . Hypertension   . 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  . Slurred speech    foggy minded    Past Surgical History:  Procedure Laterality Date  . ABDOMINAL HYSTERECTOMY    . APPENDECTOMY    . BACK SURGERY    . BREAST EXCISIONAL BIOPSY Left    2000's  . CATARACT EXTRACTION     Also, retina surgery  . CHOLECYSTECTOMY    . SHOULDER SURGERY      There were no vitals filed for this visit.  Subjective Assessment - 01/02/20 0941    Pertinent History  Pt reports that she has had general "whooziness all the time." However approximately 1 year ago she started experiencing vertigo when she rolls onto her left side in bed. She has atypical parkinsonism and sees neurologist Dr. Lesia Sago in [place]. Her history was felt to be somewhat unreliable and contradictory at times during interview. She has COPD that causes her to sometimes have shortness of breath. She had a VNG study at [facility] but reports that she doesn't know  the results and doesn't have a follow-up scheduled.    Diagnostic tests  MRI in 2018 WNL    Patient Stated Goals  Improve whooziness, unsteadiness, and vertigo    Currently in Pain?  No/denies         [facility] PT Assessment - 01/02/20 1032      Standardized Balance Assessment   Standardized Balance Assessment  Berg Balance Test      Berg Balance Test   Sit to Stand  Able to stand  independently using hands    Standing Unsupported  Able to stand safely 2 minutes    Sitting with Back Unsupported but Feet Supported on Floor or Stool  Able to sit safely and securely 2 minutes    Stand to Sit  Sits safely with minimal use of hands    Transfers  Able to transfer safely, minor use of hands    Standing Unsupported with Eyes Closed  Able to stand 10 seconds safely    Standing Unsupported with Feet Together  Able to place feet together independently and stand 1 minute safely    From Standing, Reach Forward with Outstretched Arm  Can reach forward >12 cm safely (5")    From Standing Position, Pick up Object from Floor  Able to pick up  shoe, needs supervision    From Standing Position, Turn to Look Behind Over each Shoulder  Looks behind from both sides and weight shifts well    Turn 360 Degrees  Able to turn 360 degrees safely but slowly    Standing Unsupported, Alternately Place Feet on Step/Stool  Able to stand independently and complete 8 steps >20 seconds    Standing Unsupported, One Foot in Front  Able to plae foot ahead of the other independently and hold 30 seconds    Standing on One Leg  Tries to lift leg/unable to hold 3 seconds but remains standing independently    Total Score  46         Neuromuscular Re-education    Dix-hallpike (on inverted mat table) and roll tests performed which are negative bilaterally with the exception of pt reports a brief bout of vertigo in R DH position after a latency of approximately 30s. No nystagmus observed; NuStep L2/3 x 5 minutes for warm-up  (unbilled); BERG: 46/56;   CRT Pt treated with 1 bouts of Epley Maneuver for possible R posterior canal BPPV. 1 minute holds in each position. Pt denies any vertigo during treatment until the final position when sitting up at edge of mat table. No nystagmus observed during testing and treatment.    Ther-ex  Sit to stand with hands on thighs x 10, attempted with no hands but pt experiences posterior LOB; Seated clams with red tband resistance x 10 bilateral; Seated marches with red tband resistance x 10 bilateral;   Pt educated throughout session about proper posture and technique with exercises. Improved exercise technique, movement at target joints, use of target muscles after min to mod verbal, visual, tactile cues.    Pt demonstrates excellent motivation during session today. She reports one episode of vertigo since last session so Dix-Hallpike and roll tests repeated today. No nystagmus observed in all positions however pt reports a brief bout of vertigo in R DH position after a latency of approximately 30s. Performed Epley maneuver for possible R posterior canal BPPV based on symptoms. Performed BERG with patient who scored 46/56 indicating she is at increased risk for falls. HEP initiated for both balance and strengthening. Pt encouraged to follow-up as scheduled. Pt will benefit from PT services to address deficits in strength, balance, and mobility in order to return to full function at home.                      PT Short Term Goals - 12/26/19 1255      PT SHORT TERM GOAL #1   Title  Patient will be independent in home exercise program to improve strength/mobility for better functional independence with ADLs.    Time  6    Period  Weeks    Status  New    Target Date  02/06/20        PT Long Term Goals - 12/26/19 1256      PT LONG TERM GOAL #1   Title  Pt will report no further episodes of vertigo when rolling over in bed or laying down to improve  symptom-free function at home    Time  12    Period  Weeks    Status  New    Target Date  03/19/20      PT LONG TERM GOAL #2   Title  Patient will increase self-selected 10 meter walk test by at least 0.13 m/s as to improve gait speed for  better community ambulation and to reduce fall risk.    Baseline  12/26/19: 0.74 m/s    Time  12    Period  Weeks    Status  New    Target Date  03/19/20      PT LONG TERM GOAL #3   Title  Patient will reduce timed up and go to <14 seconds to reduce fall risk and demonstrate improved transfer/gait ability.    Baseline  12/26/19: 18.0s    Time  12    Period  Weeks    Status  New    Target Date  03/19/20      PT LONG TERM GOAL #4   Title  Pt will decrease 5TSTS by at least 3 seconds in order to demonstrate clinically significant improvement in LE strength.    Baseline  12/26/19: 16.0s    Time  12    Period  Weeks    Status  New    Target Date  03/19/20            Plan - 01/02/20 0941    Clinical Impression Statement  Pt demonstrates excellent motivation during session today. She reports one episode of vertigo since last session so Dix-Hallpike and roll tests repeated today. No nystagmus observed in all positions however pt reports a brief bout of vertigo in R DH position after a latency of approximately 30s. Performed Epley maneuver for possible R posterior canal BPPV based on symptoms. Performed BERG with patient who scored 46/56 indicating she is at increased risk for falls. HEP initiated for both balance and strengthening. Pt encouraged to follow-up as scheduled. Pt will benefit from PT services to address deficits in strength, balance, and mobility in order to return to full function at home.    Personal Factors and Comorbidities  Age;Comorbidity 3+;Time since onset of injury/illness/exacerbation;Past/Current Experience    Comorbidities  atypical parkinsonism, COPD, fibromyalgia    Examination-Activity Limitations  Bend;Lift;Reach  Overhead;Squat;Stairs;Stand    Examination-Participation Restrictions  Community Activity;Shop;Volunteer    Stability/Clinical Decision Making  Unstable/Unpredictable    Rehab Potential  Fair    PT Frequency  2x / week    PT Duration  12 weeks    PT Treatment/Interventions  Therapeutic activities;Therapeutic exercise;Neuromuscular re-education;Patient/family education;ADLs/Self Care Home Management;Biofeedback;Canalith Repostioning;Cryotherapy;Electrical Stimulation;Iontophoresis 4mg /ml Dexamethasone;Moist Heat;Aquatic Therapy;Traction;Ultrasound;DME Instruction;Gait training;Stair training;Balance training;Manual techniques;Dry needling;Passive range of motion;Vestibular;Spinal Manipulations;Joint Manipulations    PT Next Visit Plan  Retest for BPPV and treat if indicated, BERG, continue with balance/strength exercises    PT Home Exercise Plan  None currently    Consulted and Agree with Plan of Care  Patient       Patient will benefit from skilled therapeutic intervention in order to improve the following deficits and impairments:  Decreased cognition, Decreased strength, Decreased balance, Decreased endurance, Dizziness  Visit Diagnosis: Muscle weakness (generalized)  Other abnormalities of gait and mobility     Problem List Patient Active Problem List   Diagnosis Date Noted  . Confusion 06/21/2017   06/23/2017 PT, DPT, GCS  Dareon Nunziato 01/02/2020, 11:05 AM  [facility] MAIN [facility] SERVICES [address] [place], [place], [place] Phone: [phone]   Fax:  [phone]  Name: SHYLEE DURRETT MRN: [id number] Chick Date of Birth: August 11, 1946

## 2020-01-02 NOTE — Patient Instructions (Signed)
Access Code: [email or web address] URL: [email or web address] Date: 01/02/2020 Prepared by: Ria Comment  Exercises Sit to Stand with Hands on Knees - 10 reps - 2 sets - 1x daily - 7x weekly Seated Hip Abduction with Resistance - 10 reps - 2 sets - 3s hold - 1x daily - 7x weekly Seated Hip Flexion March with Ankle Weights - 10 reps - 2 sets - 3s hold - 1x daily - 7x weekly Standing Romberg to 1/2 Tandem Stance - 3 x 30s with each foot forward hold - 1x daily - 7x weekly

## 2020-01-06 ENCOUNTER — Encounter: Payer: Medicare HMO | Admitting: Physical Therapy

## 2020-01-09 ENCOUNTER — Ambulatory Visit: Payer: Medicare HMO

## 2020-01-16 ENCOUNTER — Ambulatory Visit: Payer: Medicare HMO

## 2020-01-20 ENCOUNTER — Ambulatory Visit
Admission: RE | Admit: 2020-01-20 | Discharge: 2020-01-20 | Disposition: A | Discharge status: Home / Self Care | Payer: Medicare HMO | Source: Ambulatory Visit | Attending: Obstetrics and Gynecology | Admitting: Obstetrics and Gynecology

## 2020-01-20 DIAGNOSIS — Z1231 Encounter for screening mammogram for malignant neoplasm of breast: Secondary | ICD-10-CM | POA: Insufficient documentation

## 2020-01-23 ENCOUNTER — Ambulatory Visit: Payer: Medicare HMO

## 2020-01-30 ENCOUNTER — Ambulatory Visit: Payer: Medicare HMO

## 2020-02-06 ENCOUNTER — Ambulatory Visit: Payer: Medicare HMO

## 2020-08-29 ENCOUNTER — Other Ambulatory Visit: Payer: Self-pay

## 2020-08-29 ENCOUNTER — Emergency Department
Admission: EM | Admit: 2020-08-29 | Discharge: 2020-08-29 | Disposition: A | Discharge status: Home / Self Care | Payer: Medicare HMO | Attending: Emergency Medicine | Admitting: Emergency Medicine

## 2020-08-29 ENCOUNTER — Emergency Department: Payer: Medicare HMO

## 2020-08-29 ENCOUNTER — Encounter: Payer: Self-pay | Admitting: Emergency Medicine

## 2020-08-29 DIAGNOSIS — Y92009 Unspecified place in unspecified non-institutional (private) residence as the place of occurrence of the external cause: Secondary | ICD-10-CM | POA: Diagnosis not present

## 2020-08-29 DIAGNOSIS — S0101XA Laceration without foreign body of scalp, initial encounter: Secondary | ICD-10-CM | POA: Diagnosis not present

## 2020-08-29 DIAGNOSIS — Z7982 Long term (current) use of aspirin: Secondary | ICD-10-CM | POA: Insufficient documentation

## 2020-08-29 DIAGNOSIS — J449 Chronic obstructive pulmonary disease, unspecified: Secondary | ICD-10-CM | POA: Insufficient documentation

## 2020-08-29 DIAGNOSIS — Z79899 Other long term (current) drug therapy: Secondary | ICD-10-CM | POA: Diagnosis not present

## 2020-08-29 DIAGNOSIS — S0990XA Unspecified injury of head, initial encounter: Secondary | ICD-10-CM | POA: Insufficient documentation

## 2020-08-29 DIAGNOSIS — W01198A Fall on same level from slipping, tripping and stumbling with subsequent striking against other object, initial encounter: Secondary | ICD-10-CM | POA: Insufficient documentation

## 2020-08-29 DIAGNOSIS — I10 Essential (primary) hypertension: Secondary | ICD-10-CM | POA: Diagnosis not present

## 2020-08-29 DIAGNOSIS — J45909 Unspecified asthma, uncomplicated: Secondary | ICD-10-CM | POA: Diagnosis not present

## 2020-08-29 NOTE — ED Notes (Signed)
Verbal acknowledgement for d/c paperwork as signature pad is not functioning

## 2020-08-29 NOTE — ED Triage Notes (Signed)
Pt presents to ER from home reports fell backwards hitting the back of the head on the fireplace place, pt has a laceration to posterior part of head bleeding controlled. Pt reports she has Parkinson's Disease which has affected her speech, reports feeling tired. Pt talks in complete sentences.

## 2020-08-29 NOTE — ED Provider Notes (Signed)
[facility] Emergency Department Provider Note   ____________________________________________    I have reviewed the triage vital signs and the nursing notes.   HISTORY  Chief Complaint Head Injury and Fall     HPI Tina Bowman is a 74 y.o. female with a history as noted below who presents after a fall.  Patient reports she misstepped fell backwards and hit her head on the fireplace.  Suffered a laceration to the back of her scalp.  No LOC reported.  No neuro deficits.  Does take aspirin.  Feels okay right now although does describe some soreness to the back of her head.  No nausea or vomiting.  No extremity injuries or chest wall injury..  No back pain  Past Medical History:  Diagnosis Date  . Asthma   . COPD (chronic obstructive pulmonary disease) (HCC)   . Fibromyalgia   . Heart disease   . Hypertension   . 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  . Slurred speech    foggy minded    Patient Active Problem List   Diagnosis Date Noted  . Confusion 06/21/2017    Past Surgical History:  Procedure Laterality Date  . ABDOMINAL HYSTERECTOMY    . APPENDECTOMY    . BACK SURGERY    . BREAST EXCISIONAL BIOPSY Left    2000's  . CATARACT EXTRACTION     Also, retina surgery  . CHOLECYSTECTOMY    . SHOULDER SURGERY      Prior to Admission medications   Medication Sig Start Date End Date Taking? Authorizing Provider  albuterol (PROVENTIL HFA;VENTOLIN HFA) 108 (90 Base) MCG/ACT inhaler Inhale 2 puffs into the lungs every 6 (six) hours as needed for wheezing or shortness of breath.    [provider]  aspirin 325 MG tablet Take 325 mg by mouth daily.    [provider]  atorvastatin (LIPITOR) 40 MG tablet Take 40 mg by mouth every evening.    [provider]  calcium-vitamin D  (OSCAL WITH D) 500-200 MG-UNIT per tablet Take 1 tablet by mouth.    [provider]  enalapril (VASOTEC) 20 MG tablet Take 20 mg by mouth daily.    [provider]  hydrALAZINE (APRESOLINE) 25 MG tablet Take 25 mg by mouth 2 (two) times daily.    [provider]  HYDROcodone-acetaminophen (NORCO/VICODIN) 5-325 MG tablet Take 1 tablet by mouth every 4 (four) hours as needed for moderate pain. 06/19/18   Cuthriell, Delorise Royals, PA-C  levalbuterol (XOPENEX) 1.25 MG/3ML nebulizer solution Take 1.25 mg by nebulization every 4 (four) hours as needed for wheezing or shortness of breath.    [provider]  loratadine (CLARITIN) 10 MG tablet Take 10 mg by mouth daily.    [provider]  metoprolol (LOPRESSOR) 50 MG tablet Take 50 mg by mouth 2 (two) times daily.    [provider]  montelukast (SINGULAIR) 10 MG tablet Take 10 mg by mouth at bedtime.    [provider]  omeprazole (PRILOSEC) 40 MG capsule Take 40 mg by mouth daily.    [provider]  ranitidine (ZANTAC) 150 MG tablet Take 150 mg by mouth at bedtime.     [provider]  traMADol (ULTRAM) 50 MG tablet Take by mouth every 6 (six) hours as needed.    [provider]  umeclidinium bromide (INCRUSE ELLIPTA) 62.5 MCG/INH AEPB Inhale 1 puff into the lungs daily.    [provider]     Allergies Methadone, Niacin and related, Oxycontin [oxycodone], Sulfa antibiotics, and Other  Family History  Problem Relation Age of Onset  . Heart attack Mother   . Heart disease Father   . Breast cancer Cousin   . Breast cancer Maternal Aunt   . Diabetes Maternal Grandfather     Social History Social History   Tobacco Use  . Smoking status: Never Smoker  . Smokeless tobacco: Never Used  Vaping Use  . Vaping Use: Never used  Substance Use Topics  . Alcohol use: Yes    Comment: social  . Drug use: No    Review of Systems  Constitutional: No  fever/chills Eyes: No visual changes.  ENT: No epistaxis or nasal swelling Cardiovascular: Denies chest pain. Respiratory: Denies shortness of breath. Gastrointestinal: No abdominal pain.  No nausea, no vomiting.   Genitourinary: Negative for groin injury Musculoskeletal: Negative for back pain. Skin: As above Neurological: Negative for headaches or weakness   ____________________________________________   PHYSICAL EXAM:  VITAL SIGNS: ED Triage Vitals  Enc Vitals Group     BP 08/29/20 1657 140/65     Pulse Rate 08/29/20 1657 78     Resp 08/29/20 1657 20     Temp 08/29/20 1657 98.5 F (36.9 C)     Temp Source 08/29/20 1657 Oral     SpO2 08/29/20 1657 95 %     Weight 08/29/20 1704 76.2 kg (168 lb)     Height 08/29/20 1704 1.575 m (5\' 2" )     Head Circumference --      Peak Flow --      Pain Score 08/29/20 1703 5     Pain Loc --      Pain Edu? --      Excl. in GC? --     Constitutional: Alert and oriented. No acute distress.  Eyes: Conjunctivae are normal.  Head: Approximately 4 cm laceration, horizontal to the posterior scalp centrally Nose: No congestion/rhinnorhea. Mouth/Throat: Mucous membranes are moist.   Neck: Mild discomfort with range of motion, no pain with axial load, no vertebral tenderness palpation Cardiovascular: Normal rate, regular rhythm.  Good peripheral circulation.  No chest wall tenderness palpation Respiratory: Normal respiratory effort.  No retractions. Lungs CTAB. Gastrointestinal: Soft and nontender. No distention.  No CVA tenderness.  Musculoskeletal: No lower extremity tenderness nor edema.  Warm and well perfused Neurologic:  Normal speech and language. No gross focal neurologic deficits are appreciated.  Skin:  Skin is warm, dry, scalp laceration as above Psychiatric: Mood and affect are normal. Speech and behavior are normal.  ____________________________________________   LABS (all labs ordered are listed, but only abnormal results  are displayed)  Labs Reviewed - No data to display ____________________________________________  EKG  None ____________________________________________  RADIOLOGY  CT head cervical spine, reviewed by me, no intracranial hemorrhage ____________________________________________   PROCEDURES  Procedure(s) performed: yes    .13/07/21Laceration Repair  Date/Time: 08/29/2020 6:53 PM Performed by: 13/04/2020, MD Authorized by: Jene Every, MD   Consent:    Consent obtained:  Verbal   Consent given by:  Patient   Risks discussed:  Pain and infection   Alternatives discussed:  No treatment Anesthesia (see MAR for exact dosages):    Anesthesia method:  Local infiltration   Local anesthetic:  Lidocaine 1% WITH epi Laceration details:    Location:  Scalp  Scalp location:  Crown   Length (cm):  4 Repair type:    Repair type:  Simple Treatment:    Area cleansed with:  Saline   Amount of cleaning:  Standard Skin repair:    Repair method:  Staples   Number of staples:  5 Approximation:    Approximation:  Close Post-procedure details:    Dressing:  Bulky dressing   Patient tolerance of procedure:  Tolerated well, no immediate complications     Critical Care performed: No ____________________________________________   INITIAL IMPRESSION / ASSESSMENT AND PLAN / ED COURSE  Pertinent labs & imaging results that were available during my care of the patient were reviewed by me and considered in my medical decision making (see chart for details).  Patient presents after mechanical fall, laceration as noted above.  CT scans are quite reassuring.  Vital signs and remaining exam reassuring.  Laceration repaired as described above, appropriate for discharge, staple remover in 5 to 6 days with PCP    ____________________________________________   FINAL CLINICAL IMPRESSION(S) / ED DIAGNOSES  Final diagnoses:  Injury of head, initial encounter  Laceration of scalp,  initial encounter        Note:  This document was prepared using Dragon voice recognition software and may include unintentional dictation errors.   Jene Every, MD 08/29/20 [phone]

## 2020-12-13 ENCOUNTER — Other Ambulatory Visit: Payer: Self-pay | Admitting: Obstetrics and Gynecology

## 2020-12-27 ENCOUNTER — Other Ambulatory Visit: Payer: Self-pay | Admitting: Obstetrics and Gynecology

## 2020-12-27 DIAGNOSIS — N6459 Other signs and symptoms in breast: Secondary | ICD-10-CM

## 2021-01-04 ENCOUNTER — Other Ambulatory Visit: Payer: Self-pay

## 2021-01-04 ENCOUNTER — Ambulatory Visit
Admission: RE | Admit: 2021-01-04 | Discharge: 2021-01-04 | Disposition: A | Discharge status: Home / Self Care | Payer: Medicare HMO | Source: Ambulatory Visit | Attending: Obstetrics and Gynecology | Admitting: Obstetrics and Gynecology

## 2021-01-04 DIAGNOSIS — N6459 Other signs and symptoms in breast: Secondary | ICD-10-CM

## 2021-02-28 ENCOUNTER — Other Ambulatory Visit: Admission: RE | Admit: 2021-02-28 | Payer: Medicare HMO | Source: Ambulatory Visit

## 2021-03-23 ENCOUNTER — Ambulatory Visit: Payer: Medicare HMO | Attending: Orthopedic Surgery | Admitting: Physical Therapy

## 2021-03-23 ENCOUNTER — Other Ambulatory Visit: Payer: Self-pay

## 2021-03-23 ENCOUNTER — Encounter: Payer: Self-pay | Admitting: Physical Therapy

## 2021-03-23 DIAGNOSIS — M6281 Muscle weakness (generalized): Secondary | ICD-10-CM | POA: Insufficient documentation

## 2021-03-23 DIAGNOSIS — R29818 Other symptoms and signs involving the nervous system: Secondary | ICD-10-CM

## 2021-03-23 DIAGNOSIS — R2681 Unsteadiness on feet: Secondary | ICD-10-CM | POA: Diagnosis present

## 2021-03-23 DIAGNOSIS — R262 Difficulty in walking, not elsewhere classified: Secondary | ICD-10-CM | POA: Insufficient documentation

## 2021-03-23 DIAGNOSIS — M25552 Pain in left hip: Secondary | ICD-10-CM | POA: Insufficient documentation

## 2021-03-23 NOTE — Therapy (Signed)
[facility]INE [address] [place], [address] Phone: [phone]   Fax:  [phone]  Physical Therapy Evaluation  Patient Details  Name: Tina Bowman MRN: [id number] Date of Birth: 1946/02/02 Referring Provider (PT): Baldwin [place], [place]   Encounter Date: 03/23/2021   PT End of Session - 03/23/21 1556    Visit Number 1    Number of Visits 24    Date for PT Re-Evaluation 06/15/21    Authorization Type [employer] MEDICARE reporting period from 03/23/2021    Progress Note Due on Visit 10    PT Start Time 0947    PT Stop Time 1030    PT Time Calculation (min) 43 min    Activity Tolerance Patient tolerated treatment well    Behavior During Therapy [facility] for tasks assessed/performed           Past Medical History:  Diagnosis Date  . Asthma   . COPD (chronic obstructive pulmonary disease) (HCC)   . Fibromyalgia   . Heart disease   . Hypertension   . 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  . Slurred speech    foggy minded    Past Surgical History:  Procedure Laterality Date  . ABDOMINAL HYSTERECTOMY    . APPENDECTOMY    . BACK SURGERY    . BREAST EXCISIONAL BIOPSY Left    2000's  . CATARACT EXTRACTION     Also, retina surgery  . CHOLECYSTECTOMY    . SHOULDER SURGERY      There were no vitals filed for this visit.    Subjective Assessment - 03/23/21 0958    Subjective Patient is here with her husband [name] who contributes as appropriate. Patient has some expressive aphasia and sometimes states opposite of what she means. Also has garbled speech at times or requires additional time for response. She states condition started when she fell within the last 60 days. She thought she cracked a bone in her hip but radiographs were negative. The pain was staying the same since  it started until she got an injection about 2 weeks ago. She saw the orthopedist who said it could be coming from her back or left hip bursitis. She got an injection in the left hip bursa about 2 weeks ago and her pain has resolved currently. Her doctor expected her pain to get better with the shot if it was more from bursitis and not if it was from the low back. She has had two low back operations including a fusion of L4-L5. Last time she had hip pain was before the shot, and she currently has no pain. Usually ambulates with SPC and assistance from husband in the community. Uses rolling walker independently at home. Gets help for ADLs and IADLs (husband helps her in the shower but she gets dressed her self). She uses a lift chair independently at home but needs assistance getting up and down from a normal chair. Gets in and out of bed independently with a stool. She walks daily. Patient and her husband agree her functional mobility is similar to PLOF since her pain has improved with the shot. She does want to do physical therapy to improve her strength and help the pain stay gone. She goes on daily walks (30 min in the home). Patient reports she does have concerns about getting  around her home safely due to the rug. Husband is only concerned about her safety walking without the walker and states sometimes she leaves it behind because she wants to use both hands for something.  Unsure what caused the fall that hurt her hip. No other falls in the last 6 months.    Patient is accompained by: Family member    Pertinent History Patient is a 75 y.o. female who presents to outpatient physical therapy with a referral for medical diagnosis trochanteric bursitis of left hip. This patient's chief complaints consist of left lateral hip pain after a fall leading to the following functional deficits: increased difficulty with walking, sleeping, ADLs/dressing, getting up and down from a chair, etc, and decreased quality of  life..  Relevant past medical history and comorbidities include atypical parkinson's, asthma, COPD, fibromyalgia, HTN, slurred speech ("foggy minded"), hx of 2 back surgeries (fusion at L4-5 per pt), cholecystectomy, abdominal hysterectomy, cataract extraction and retina surgery.  Patient denies hx of cancer, stroke, seizures, lung problem, major cardiac events, diabetes, unexplained weight loss, changes in bowel or bladder problems, new onset stumbling or dropping things apart from described below.    Limitations Walking;House hold activities;Other (comment)   : increased difficulty with walking, sleeping, ADLs/dressing, getting up and down from a chair, etc.   Diagnostic tests hip xray negative for fracture.  xray report left hip 02/22/2021: "An anteroposterior view of the pelvis and anteroposterior and lateral views of the left hip were obtained.  Images reveal mild degenerative   changes to the femoral acetabular joint.  Large bone callus noted at   previous pubic rami fracture site.  Callus formation noted previously on   x-rays from 03/19/2020." lumbar spine xray report 02/22/2021: "Anteroposterior, oblique, and lateral views of the lumbar spine were  obtained.  Images reveal severe degenerative changes throughout the lumbar  spine.  Significant scoliosis noted.  Severe loss of disc space noted at  L1-L2, L2-L3.  No fractures noted."    Patient Stated Goals "would like to get rid of this pain" (that she had in her left hip).    Currently in Pain? No/denies    Pain Score 0-No pain   W: 7/10; B: 0/10   Pain Location Hip    Pain Orientation Left;Lateral    Pain Descriptors / Indicators Aching;Burning    Pain Type Acute pain    Pain Radiating Towards denies numbness or tingling, radiation to the left buttocks    Pain Onset More than a month ago    Pain Frequency Intermittent    Aggravating Factors  standing, sitting,    Pain Relieving Factors hip injection    Effect of Pain on Daily Activities Functional  Limitations: increased difficulty with walking, sleeping, ADLs/dressing, getting up and down from a chair, etc.             [facility] PT Assessment - 03/23/21 1009      Assessment   Medical Diagnosis trochanteric bursitis of left hip    Referring Provider (PT) Baldwin [place], [place]    Onset Date/Surgical Date 02/23/21    Prior Therapy none for this problem prior to current episode of care      Precautions   Precautions Fall      Restrictions   Weight Bearing Restrictions No      Balance Screen   Has the patient fallen in the past 6 months Yes    How many times? 1    Has the patient had a decrease  in activity level because of a fear of falling?  Yes    Is the patient reluctant to leave their home because of a fear of falling?  Yes      Home Environment   Living Environment Private residence    Living Arrangements Spouse/significant other    Available Help at Discharge Family    Type of Home House    Home Access Stairs to enter    Entrance Stairs-Number of Steps 7    Entrance Stairs-Rails Right;Left;Cannot reach both    Home Layout One level    Home Equipment Walker - 2 wheels;[place] - single point      Prior Function   Leisure daily walking, reading, crosswords,      Cognition   Overall Cognitive Status History of cognitive impairments - at baseline   atypical parkinson's. slurring of speech, decreased processing speed, expressive aphasia including saying the opposite of what she means           OBJECTIVE  OBSERVATION/INSPECTION . Posture: mild stooped posture in standing.  . Tremor: none observed . Muscle bulk: grossly symmetrical and WFL . Bed mobility: supine <> sit and rolling mod I with difficulty due to decreased strength and motor control . Transfers: sit <> stand supervision to MinA due to difficulty shifting forward. Use of B UE.  . Gait: demonstrates mildly stooped posture, instability of gait with short step length, requires hand held assist for balance when using  SPC, fails to use SPC properly and in sync with gait.    NEUROLOGICAL Dermatomes  . L2-S2 appears equal and intact to light touch. Myotomes . L2-S2 appears intact Coordination impaired   PERIPHERAL JOINT MOTION (in degrees)  Passive Range of Motion (PROM) Comments: B hips WFL for basic mobility except lacks hip extension past ~ 10 degrees bilaterally.   MUSCLE PERFORMANCE (MMT):  *Indicates pain 03/23/21 Date Date  Joint/Motion R/L R/L R/L  Hip     Flexion 4*/4 / /  Abduction 4+/4* / /  External rotation (seated) 4/4 / /  Internal rotation (seated) 4/3 / /  Knee     Extension 4+/4+ / /  Flexion 4+/4+ / /  Comments: B ankles and knees 4-4+/5. Pain at R hip flexor with R hip flexion.   SPECIAL TESTS: Straight leg raise (SLR): Neural tension bilaterally, does not appear to be concordant.  FABER: R = pain at anterior hip Scour test: R = negative  PALPATION: - TTP over left lateral hip  FUNCTIONAL/BALANCE TESTS: Five Time Sit to Stand (5TSTS): 43 seconds with BUE support from plinth and heavy use of back of legs on plinth (did not come to independently balanced stand due to consistent leaning on plinth), lacks control on descent. Reports L hip pain.   EDUCATION/COGNITION: Patient is alert and oriented X 4.  Objective measurements completed on examination: See above findings.    TREATMENT:  Therapeutic exercise: to centralize symptoms and improve ROM, strength, muscular endurance, and activity tolerance required for successful completion of functional activities.  - seated hip abduction against red theraband, 1x10 with 5 second holds (well tolerated).  - Education on diagnosis, prognosis, POC, anatomy and physiology of current condition.  - Education on HEP including handout (patient left without handout).   Pt required multimodal cuing for proper technique and to facilitate improved neuromuscular control, strength, range of motion, and functional ability resulting in  improved performance and form.   HOME EXERCISE PROGRAM Access Code: [email or web address] URL: [email or web address] Date: 03/23/2021 Prepared by:  Norton BlizzardSara Jarelly Rinck  Exercises Seated Hip Abduction with Resistance - 1 x daily - 2 sets - 10 reps - 5 seconds hold     PT Education - 03/23/21 1555    Education Details Exercise purpose/form. Self management techniques. Education on diagnosis, prognosis, POC, anatomy and physiology of current condition Education on HEP    Person(s) Educated Patient;Spouse    Methods Explanation;Demonstration;Tactile cues;Verbal cues    Comprehension Returned demonstration;Verbal cues required;Tactile cues required;Verbalized understanding;Need further instruction            PT Short Term Goals - 03/23/21 1622      PT SHORT TERM GOAL #1   Title Be able to participate in initial home exercise program with assistance from spouse for home-management of symptoms.    Baseline initial HEP provided at IE (03/23/2021);    Time 2    Period Weeks    Status New    Target Date 04/06/21             PT Long Term Goals - 03/23/21 1623      PT LONG TERM GOAL #1   Title Be participating in long home exercise program with assistance from spouse for home-management of symptoms.    Baseline initial HEP provded at IE (03/23/2021);    Time 12    Period Weeks    Status New   TARGET DATE FOR ALL LONG TERM GOALS: 06/15/2021   Target Date --   TARGET DATE FOR ALL LONG TERM GOALS: 06/15/2021     PT LONG TERM GOAL #2   Title Patient (> 76 years old) will complete five times sit to stand test from 18.5 inch surface in < 20 seconds indicating an increased LE strength and improved balance.    Baseline 43 seconds with BUE support from plinth and heavy use of back of legs on plinth (did not come to independently balanced stand due to consistent leaning on plinth), lacks control on descent. Reports L hip pain (03/23/2021);    Time 12    Period Weeks    Status New      PT LONG TERM  GOAL #3   Title Reduce pain with functional activities to equal or less than 2/10 to allow patient to complete usual activities including ADLs, IADLs, and functional mobility with less difficulty.    Baseline up to 7/10 (03/23/2021);    Time 12    Period Weeks    Status New      PT LONG TERM GOAL #4   Title Complete community, work and/or recreational activities without limitation due to current condition.    Baseline Functional Limitations: increased difficulty with walking, sleeping, ADLs/dressing, getting up and down from a chair, etc (03/23/2021);    Time 12    Period Weeks    Status New      PT LONG TERM GOAL #5   Title Improve B hip abduction strength to 4+/5 with no increase in pain to improve patient's ability to complete  valued functional tasks such as walking, transfering with less difficulty.    Baseline left hip abduction 4/5 and painful (03/23/2021);    Time 12    Period Weeks    Status New                  Plan - 03/23/21 1632    Clinical Impression Statement Patient is a 75 y.o. 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 Patient presents with significant pain, ROM, joint stiffness, posture, motor control, muscle tension, muscle performance (strength/power/endurance), balance, gait, and activity tolerance impairments that are limiting ability to complete her usual activities including walking, sleeping, ADLs/dressing, getting up and down from a chair, etc, without difficulty. Her pain has improved since injection to the selected bursa at the left hip and patient would benefit from physical therapy to improve her long term recovery and return to function. Patient will benefit from  skilled physical therapy intervention to address current body structure impairments and activity limitations to improve function and work towards goals set in current POC in order to return to prior level of function or maximal functional improvement.    Personal Factors and Comorbidities Age;Comorbidity 3+;Time since onset of injury/illness/exacerbation;Past/Current Experience    Comorbidities atypical parkinson's, asthma, COPD, fibromyalgia, HTN, slurred speech ("foggy minded"), hx of 2 back surgeries (fusion at L4-5 per pt), cholecystectomy, abdominal hysterectomy, cataract extraction and retina surgery.    Examination-Activity Limitations Squat;Stairs;Stand;Sleep;Transfers;Dressing;Locomotion Level;Bed Mobility    Examination-Participation Restrictions Community Activity;Interpersonal Relationship;Other   increased difficulty with walking, sleeping, ADLs/dressing, getting up and down from a chair, etc.   Stability/Clinical Decision Making Evolving/Moderate complexity    Clinical Decision Making Moderate    Rehab Potential Good    PT Frequency 2x / week    PT Duration 12 weeks    PT Treatment/Interventions Therapeutic activities;Therapeutic exercise;Neuromuscular re-education;Patient/family education;ADLs/Self Care Home Management;Cryotherapy;Electrical Stimulation;Moist Heat;Aquatic Therapy;DME Instruction;Gait training;Stair training;Balance training;Manual techniques;Dry needling;Passive range of motion;Spinal Manipulations;Joint Manipulations;Functional mobility training;Cognitive remediation    PT Next Visit Plan update HPE, hip strengthening/loading as tolerated, balance activities    PT Home Exercise Plan Mmed[employer] Access Code: [email or web address]    Consulted and Agree with Plan of Care Patient;Family member/caregiver    Family Member Consulted Husband [name]           Patient will benefit from skilled therapeutic intervention in order to improve the following deficits and impairments:   Decreased cognition,Decreased strength,Decreased balance,Decreased endurance,Abnormal gait,Decreased knowledge of use of DME,Improper body mechanics,Pain,Decreased coordination,Decreased mobility,Decreased activity tolerance,Decreased range of motion,Hypomobility,Impaired perceived functional ability,Impaired flexibility,Difficulty walking  Visit Diagnosis: Pain in left hip  Difficulty in walking, not elsewhere classified  Muscle weakness (generalized)  Unsteadiness on feet  Other symptoms and signs involving the nervous system     Problem List Patient Active Problem List   Diagnosis Date Noted  . Confusion 06/21/2017    Luretha Murphy. Ilsa Iha, PT, DPT 03/23/21, 4:37 PM  [facility] AND SPORTS MEDICINE [address] [place], [address] Phone: [phone]   Fax:  [phone]  Name: IREAN KENDRICKS MRN: [id number] Date of Birth: 04/18/1946

## 2021-03-29 ENCOUNTER — Ambulatory Visit: Payer: Medicare HMO | Admitting: Physical Therapy

## 2021-03-29 ENCOUNTER — Other Ambulatory Visit: Payer: Self-pay

## 2021-03-29 ENCOUNTER — Encounter: Payer: Self-pay | Admitting: Physical Therapy

## 2021-03-29 DIAGNOSIS — R262 Difficulty in walking, not elsewhere classified: Secondary | ICD-10-CM

## 2021-03-29 DIAGNOSIS — R2681 Unsteadiness on feet: Secondary | ICD-10-CM

## 2021-03-29 DIAGNOSIS — R29818 Other symptoms and signs involving the nervous system: Secondary | ICD-10-CM

## 2021-03-29 DIAGNOSIS — M25552 Pain in left hip: Secondary | ICD-10-CM

## 2021-03-29 DIAGNOSIS — M6281 Muscle weakness (generalized): Secondary | ICD-10-CM

## 2021-03-29 NOTE — Therapy (Signed)
[facility] [facility] MEDICINE [address] [place], [address] Phone: [phone]   Fax:  [phone]  Physical Therapy Treatment  Patient Details  Name: Tina Bowman MRN: [id number] Date of Birth: [birth year] Referring Provider (PT): Baldwin [place], [place]   Encounter Date: 03/29/2021   PT End of Session - 03/29/21 1420    Visit Number 2    Number of Visits 24    Date for PT Re-Evaluation 06/15/21    Authorization Type [employer] MEDICARE reporting period from 03/23/2021    Progress Note Due on Visit 10    PT Start Time 1120    PT Stop Time 1200    PT Time Calculation (min) 40 min    Activity Tolerance Patient limited by pain;Patient tolerated treatment well    Behavior During Therapy [facility] for tasks assessed/performed           Past Medical History:  Diagnosis Date  . Asthma   . COPD (chronic obstructive pulmonary disease) (HCC)   . Fibromyalgia   . Heart disease   . Hypertension   . 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  . Slurred speech    foggy minded    Past Surgical History:  Procedure Laterality Date  . ABDOMINAL HYSTERECTOMY    . APPENDECTOMY    . BACK SURGERY    . BREAST EXCISIONAL BIOPSY Left    2000's  . CATARACT EXTRACTION     Also, retina surgery  . CHOLECYSTECTOMY    . SHOULDER SURGERY      There were no vitals filed for this visit.   Subjective Assessment - 03/29/21 1122    Subjective Patient reports 4/10 pain in the left hip upon arrival. Is here with her husband Tina Bowman. States she has been doing the exercises at home and they have caused pain.    Patient is accompained by: Family member    Pertinent History Patient is a 75 y.o. female who presents to outpatient physical therapy with a referral for medical diagnosis trochanteric bursitis of left hip. This  patient's chief complaints consist of left lateral hip pain after a fall leading to the following functional deficits: increased difficulty with walking, sleeping, ADLs/dressing, getting up and down from a chair, etc, and decreased quality of life..  Relevant past medical history and comorbidities include atypical parkinson's, asthma, COPD, fibromyalgia, HTN, slurred speech ("foggy minded"), hx of 2 back surgeries (fusion at L4-5 per pt), cholecystectomy, abdominal hysterectomy, cataract extraction and retina surgery.  Patient denies hx of cancer, stroke, seizures, lung problem, major cardiac events, diabetes, unexplained weight loss, changes in bowel or bladder problems, new onset stumbling or dropping things apart from described below.    Limitations Walking;House hold activities;Other (comment)   : increased difficulty with walking, sleeping, ADLs/dressing, getting up and down from a chair, etc.   Diagnostic tests hip xray negative for fracture.  xray report left hip 02/22/2021: "An anteroposterior view of the pelvis and anteroposterior and lateral views of the left hip were obtained.  Images reveal mild degenerative   changes to the femoral acetabular joint.  Large bone callus noted at   previous pubic rami fracture site.  Callus formation noted previously on   x-rays from 03/19/2020." lumbar spine xray report 02/22/2021: "Anteroposterior, oblique, and lateral views of the lumbar spine were  obtained.  Images reveal severe  degenerative changes throughout the lumbar  spine.  Significant scoliosis noted.  Severe loss of disc space noted at  L1-L2, L2-L3.  No fractures noted."    Patient Stated Goals "would like to get rid of this pain" (that she had in her left hip).    Currently in Pain? Yes    Pain Score 4     Pain Onset More than a month ago             TREATMENT:  Therapeutic exercise:to centralize symptoms and improve ROM, strength, muscular endurance, and activity tolerance required for successful  completion of functional activities.  - seated hip abduction against red theraband, 1x4 with 5 second holds (good form, reports pain) - seated isometric hip abduction against strap around knees, 1x10 with 4 second hold (reports pain in hip).  - seated hip adduction against small ball between knee,s 1x10 sith 4 second holds (reports pain in hip).  (manual therapy - see below).  - hooklying hip abduction against strap with wide stance, 2x10 with 5 second hold (well tolerated).  - hooklying wide stance bridge, 2x10, 4 second hold.(well tolerated) - hooklying wide stance hip adduction against shoe box, 2x10, 4 second holds - ambulation with SPC and min A around clinic to/from room, waiting room, chair.  - sit <> stand with min A from/to chair, plinth - Education on HEP including handout.  Pt required multimodal cuing for proper technique and to facilitate improved neuromuscular control, strength, range of motion, and functional ability resulting in improved performance and form.  Manual therapy: to reduce pain and tissue tension, improve range of motion, neuromodulation, in order to promote improved ability to complete functional activities.  - hooklying LAD through L hip, grade II-III, 3x30 seconds - hooklying L hip lateral glide joint mobilization, grade II-III, 2x30 seconds (attempted caudal glide but too painful with pressure at hip flexor).  - hooklying STM to L hip flexors adductors (painful and tight - pt requested to stop).    HOME EXERCISE PROGRAM Access Code: [email or web address] URL: [email or web address] Date: 03/29/2021 Prepared by: Tina Bowman  Program Notes Please perform in the bed (not the floor since it is hard to get up).  Keeping feet wider than hips will decrease potential for pain at the hip.    Exercises Hooklying Isometric Hip Abduction with Belt - 1 x daily - 2 sets - 10 reps - 4 reps hold Bridge - 1 x daily - 2 sets - 10 reps - 4 seconds hold Supine Hip  Adduction Isometric with Ball - 1 x daily - 2 sets - 10 reps - 4 seconds hold    PT Education - 03/29/21 1420    Education Details Exercise purpose/form. Self management techniques    Person(s) Educated Patient;Spouse    Methods Explanation;Demonstration;Tactile cues;Verbal cues;Handout    Comprehension Verbalized understanding;Returned demonstration;Verbal cues required;Tactile cues required;Need further instruction            PT Short Term Goals - 03/29/21 1424      PT SHORT TERM GOAL #1   Title Be able to participate in initial home exercise program with assistance from spouse for home-management of symptoms.    Baseline initial HEP provided at IE (03/23/2021);    Time 2    Period Weeks    Status Achieved    Target Date 04/06/21             PT Long Term Goals - 03/23/21 1623      PT LONG TERM GOAL #  1   Title Be participating in long home exercise program with assistance from spouse for home-management of symptoms.    Baseline initial HEP provded at IE (03/23/2021);    Time 12    Period Weeks    Status New   TARGET DATE FOR ALL LONG TERM GOALS: 06/15/2021   Target Date --   TARGET DATE FOR ALL LONG TERM GOALS: 06/15/2021     PT LONG TERM GOAL #2   Title Patient (> 46 years old) will complete five times sit to stand test from 18.5 inch surface in < 20 seconds indicating an increased LE strength and improved balance.    Baseline 43 seconds with BUE support from plinth and heavy use of back of legs on plinth (did not come to independently balanced stand due to consistent leaning on plinth), lacks control on descent. Reports L hip pain (03/23/2021);    Time 12    Period Weeks    Status New      PT LONG TERM GOAL #3   Title Reduce pain with functional activities to equal or less than 2/10 to allow patient to complete usual activities including ADLs, IADLs, and functional mobility with less difficulty.    Baseline up to 7/10 (03/23/2021);    Time 12    Period Weeks    Status New       PT LONG TERM GOAL #4   Title Complete community, work and/or recreational activities without limitation due to current condition.    Baseline Functional Limitations: increased difficulty with walking, sleeping, ADLs/dressing, getting up and down from a chair, etc (03/23/2021);    Time 12    Period Weeks    Status New      PT LONG TERM GOAL #5   Title Improve B hip abduction strength to 4+/5 with no increase in pain to improve patient's ability to complete  valued functional tasks such as walking, transfering with less difficulty.    Baseline left hip abduction 4/5 and painful (03/23/2021);    Time 12    Period Weeks    Status New                 Plan - 03/29/21 1423    Clinical Impression Statement Patient reported increased pain with HEP and seated exercises in clinic so exercises were modified to be in hooklying with isometric force used with improved tolerance. HEP updated accordingly. Patient required extra time for communication due to dysphasia. Appeared to understand HEP and reported ability to complete exercises without pain in the clinic. Husband educated on HEP and plan as well.  Patient would benefit from continued management of limiting condition by skilled physical therapist to address remaining impairments and functional limitations to work towards stated goals and return to PLOF or maximal functional independence.    Personal Factors and Comorbidities Age;Comorbidity 3+;Time since onset of injury/illness/exacerbation;Past/Current Experience    Comorbidities atypical parkinson's, asthma, COPD, fibromyalgia, HTN, slurred speech ("foggy minded"), hx of 2 back surgeries (fusion at L4-5 per pt), cholecystectomy, abdominal hysterectomy, cataract extraction and retina surgery.    Examination-Activity Limitations Squat;Stairs;Stand;Sleep;Transfers;Dressing;Locomotion Level;Bed Mobility    Examination-Participation Restrictions Community Activity;Interpersonal Relationship;Other    increased difficulty with walking, sleeping, ADLs/dressing, getting up and down from a chair, etc.   Stability/Clinical Decision Making Evolving/Moderate complexity    Rehab Potential Good    PT Frequency 2x / week    PT Duration 12 weeks    PT Treatment/Interventions Therapeutic activities;Therapeutic exercise;Neuromuscular re-education;Patient/family education;ADLs/Self Care Home  Management;Cryotherapy;Electrical Stimulation;Moist Heat;Aquatic Therapy;DME Instruction;Gait training;Stair training;Balance training;Manual techniques;Dry needling;Passive range of motion;Spinal Manipulations;Joint Manipulations;Functional mobility training;Cognitive remediation    PT Next Visit Plan update HPE, hip strengthening/loading as tolerated, balance activities    PT Home Exercise Plan Mmedbridge Access Code: [email or web address]    Consulted and Agree with Plan of Care Patient;Family member/caregiver    Family Member Consulted Husband Tina Bowman           Patient will benefit from skilled therapeutic intervention in order to improve the following deficits and impairments:  Decreased cognition,Decreased strength,Decreased balance,Decreased endurance,Abnormal gait,Decreased knowledge of use of DME,Improper body mechanics,Pain,Decreased coordination,Decreased mobility,Decreased activity tolerance,Decreased range of motion,Hypomobility,Impaired perceived functional ability,Impaired flexibility,Difficulty walking  Visit Diagnosis: Pain in left hip  Difficulty in walking, not elsewhere classified  Muscle weakness (generalized)  Unsteadiness on feet  Other symptoms and signs involving the nervous system     Problem List Patient Active Problem List   Diagnosis Date Noted  . Confusion 06/21/2017    Luretha Murphy. Ilsa Iha, PT, DPT 03/29/21, 2:27 PM  [place] [facility] PHYSICAL AND SPORTS MEDICINE [address], [place], [address] Phone: [phone]   Fax:  [phone]  Name:  Tina Bowman MRN: [id number] Date of Birth: 1946-05-01

## 2021-03-31 ENCOUNTER — Encounter: Payer: Self-pay | Admitting: Physical Therapy

## 2021-03-31 ENCOUNTER — Ambulatory Visit: Payer: Medicare HMO | Admitting: Physical Therapy

## 2021-03-31 ENCOUNTER — Other Ambulatory Visit: Payer: Self-pay

## 2021-03-31 DIAGNOSIS — R262 Difficulty in walking, not elsewhere classified: Secondary | ICD-10-CM

## 2021-03-31 DIAGNOSIS — M6281 Muscle weakness (generalized): Secondary | ICD-10-CM

## 2021-03-31 DIAGNOSIS — R29818 Other symptoms and signs involving the nervous system: Secondary | ICD-10-CM

## 2021-03-31 DIAGNOSIS — R2681 Unsteadiness on feet: Secondary | ICD-10-CM

## 2021-03-31 DIAGNOSIS — M25552 Pain in left hip: Secondary | ICD-10-CM | POA: Diagnosis not present

## 2021-03-31 NOTE — Therapy (Signed)
[place] [facility]ORTS MEDICINE [address] [place], [address] Phone: [phone]   Fax:  [phone]  Physical Therapy Treatment  Patient Details  Name: Tina Bowman MRN: [id number] Date of Birth: 1946/06/10 Referring Provider (PT): Baldwin [place], [place]   Encounter Date: 03/31/2021   PT End of Session - 03/31/21 6962     Visit Number 3    Number of Visits 24    Date for PT Re-Evaluation 06/15/21    Authorization Type [employer] MEDICARE reporting period from 03/23/2021    Progress Note Due on Visit 10    PT Start Time 0903    PT Stop Time 0943    PT Time Calculation (min) 40 min    Activity Tolerance Patient limited by pain;Patient tolerated treatment well    Behavior During Therapy 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   Subjective Assessment - 03/31/21 0914     Subjective Pateint reports no pain upon arrival. States she hurt in R hip for 2 days after last treatment session. State her HEP went well. Husband [name] accompanied her to the waiting room and states she had pain with exercies. Patient states she had some R hip pain during exercises at home that was no worse after. Unsure why she has R hip pain. States she has R hip pain sometimes in the past as  well.  Patient arrived with [facility] and hand held assist for mobility.    Patient is accompained by: Family member    Pertinent History Patient is a 75 y.o. female who presents to outpatient physical therapy with a referral for medical diagnosis trochanteric bursitis of left hip. This patient's chief complaints consist of left lateral hip pain after a fall leading to the following functional deficits: increased difficulty with walking, sleeping, ADLs/dressing, getting up and down from a chair, etc, and decreased quality of life..  Relevant past medical history and comorbidities include atypical parkinson's, asthma, COPD, fibromyalgia, HTN, slurred speech ("foggy minded"), hx of 2 back surgeries (fusion at L4-5 per pt), cholecystectomy, abdominal hysterectomy, cataract extraction and retina surgery.  Patient denies hx of cancer, stroke, seizures, lung problem, major cardiac events, diabetes, unexplained weight loss, changes in bowel or bladder problems, new onset stumbling or dropping things apart from described below.    Limitations Walking;House hold activities;Other (comment)   : increased difficulty with walking, sleeping, ADLs/dressing, getting up and down from a chair, etc.   Diagnostic tests hip xray negative for fracture.  xray report left hip 02/22/2021: "An anteroposterior view of the pelvis and anteroposterior and lateral views of the left hip  were obtained.  Images reveal mild degenerative   changes to the femoral acetabular joint.  Large bone callus noted at   previous pubic rami fracture site.  Callus formation noted previously on   x-rays from 03/19/2020." lumbar spine xray report 02/22/2021: "Anteroposterior, oblique, and lateral views of the lumbar spine were  obtained.  Images reveal severe degenerative changes throughout the lumbar  spine.  Significant scoliosis noted.  Severe loss of disc space noted at  L1-L2, L2-L3.  No fractures noted."    Patient Stated Goals "would like to get rid of this pain"  (that she had in her left hip).    Currently in Pain? No/denies    Pain Onset More than a month ago             TREATMENT:  Therapeutic exercise: to centralize symptoms and improve ROM, strength, muscular endurance, and activity tolerance required for successful completion of functional activities.  - hooklying hip abduction against strap with wide stance, 3x10 with 1-5 second hold (pain L hip during, no worse). - hooklying wide stance hip adduction against shoe box, 3x10, 5 second holds (pain L hip during, no worse). - hooklying wide stance bridge, 3x10, 5 second hold. (well tolerated, pulling /pain over anterior thighs) - hooklying abdominal brace with marching, 1x10 each side. Patient reports pain at anterior thighs and would like to stop. Demo poor trunk control.   (Manual therapy - see below)  - ambulation with SPC and min A around clinic to/from room, waiting room, - sit <> stand with min A from/to chair, plinth - Education on HEP including handout.   Pt required multimodal cuing for proper technique and to facilitate improved neuromuscular control, strength, range of motion, and functional ability resulting in improved performance and form. needs help counting both reps and holds.   Manual therapy: to reduce pain and tissue tension, improve range of motion, neuromodulation, in order to promote improved ability to complete functional activities. - hooklying LAD through each hip, grade II-III, 3x30 seconds each side (pain gone after).     HOME EXERCISE PROGRAM Access Code: [email or web address] URL: [email or web address] Date: 03/29/2021 Prepared by: Norton BlizzardSara Neetu Carrozza   Program Notes Please perform in the bed (not the floor since it is hard to get up). Keeping feet wider than hips will decrease potential for pain at the hip.     Exercises Hooklying Isometric Hip Abduction with Belt - 1 x daily - 2 sets - 10 reps - 4 reps hold Bridge - 1 x daily - 2 sets - 10 reps - 4 seconds  hold Supine Hip Adduction Isometric with Ball - 1 x daily - 2 sets - 10 reps - 4 seconds hold     PT Education - 03/31/21 0916     Education Details Exercise purpose/form. Self management techniques    Person(s) Educated Patient    Methods Explanation;Demonstration;Tactile cues;Verbal cues    Comprehension Returned demonstration;Verbalized understanding;Verbal cues required;Tactile cues required;Need further instruction              PT Short Term Goals - 03/29/21 1424       PT SHORT TERM GOAL #1   Title Be able to participate in initial home exercise program with assistance from spouse for home-management of symptoms.    Baseline initial HEP provided at IE (03/23/2021);    Time 2    Period Weeks    Status Achieved    Target Date 04/06/21  PT Long Term Goals - 03/23/21 1623       PT LONG TERM GOAL #1   Title Be participating in long home exercise program with assistance from spouse for home-management of symptoms.    Baseline initial HEP provded at IE (03/23/2021);    Time 12    Period Weeks    Status New   TARGET DATE FOR ALL LONG TERM GOALS: 06/15/2021   Target Date --   TARGET DATE FOR ALL LONG TERM GOALS: 06/15/2021     PT LONG TERM GOAL #2   Title Patient (> 57 years old) will complete five times sit to stand test from 18.5 inch surface in < 20 seconds indicating an increased LE strength and improved balance.    Baseline 43 seconds with BUE support from plinth and heavy use of back of legs on plinth (did not come to independently balanced stand due to consistent leaning on plinth), lacks control on descent. Reports L hip pain (03/23/2021);    Time 12    Period Weeks    Status New      PT LONG TERM GOAL #3   Title Reduce pain with functional activities to equal or less than 2/10 to allow patient to complete usual activities including ADLs, IADLs, and functional mobility with less difficulty.    Baseline up to 7/10 (03/23/2021);    Time 12    Period  Weeks    Status New      PT LONG TERM GOAL #4   Title Complete community, work and/or recreational activities without limitation due to current condition.    Baseline Functional Limitations: increased difficulty with walking, sleeping, ADLs/dressing, getting up and down from a chair, etc (03/23/2021);    Time 12    Period Weeks    Status New      PT LONG TERM GOAL #5   Title Improve B hip abduction strength to 4+/5 with no increase in pain to improve patient's ability to complete  valued functional tasks such as walking, transfering with less difficulty.    Baseline left hip abduction 4/5 and painful (03/23/2021);    Time 12    Period Weeks    Status New                   Plan - 03/31/21 0940     Clinical Impression Statement Patient tolerated treatment well overall with some R pain with hip abduction/adduction exercises that was no worse after. Also complained of pain in the quads with bridges that lingered after exercise. Pain resolved after manual therapy. Patient requires min A with transfers and ambulation due to weakness and imbalance/motor control deficits related to Parkinson's. Requires assistance with counting. Patient would benefit from continued management of limiting condition by skilled physical therapist to address remaining impairments and functional limitations to work towards stated goals and return to PLOF or maximal functional independence.    Personal Factors and Comorbidities Age;Comorbidity 3+;Time since onset of injury/illness/exacerbation;Past/Current Experience    Comorbidities atypical parkinson's, asthma, COPD, fibromyalgia, HTN, slurred speech ("foggy minded"), hx of 2 back surgeries (fusion at L4-5 per pt), cholecystectomy, abdominal hysterectomy, cataract extraction and retina surgery.    Examination-Activity Limitations Squat;Stairs;Stand;Sleep;Transfers;Dressing;Locomotion Level;Bed Mobility    Examination-Participation Restrictions Community  Activity;Interpersonal Relationship;Other   increased difficulty with walking, sleeping, ADLs/dressing, getting up and down from a chair, etc.   Stability/Clinical Decision Making Evolving/Moderate complexity    Rehab Potential Good    PT Frequency 2x / week  PT Duration 12 weeks    PT Treatment/Interventions Therapeutic activities;Therapeutic exercise;Neuromuscular re-education;Patient/family education;ADLs/Self Care Home Management;Cryotherapy;Electrical Stimulation;Moist Heat;Aquatic Therapy;DME Instruction;Gait training;Stair training;Balance training;Manual techniques;Dry needling;Passive range of motion;Spinal Manipulations;Joint Manipulations;Functional mobility training;Cognitive remediation    PT Next Visit Plan hip strengthening/loading as tolerated, balance activities    PT Home Exercise Plan Mmed[facility] Access Code: [email or web address]    Consulted and Agree with Plan of Care Patient;Family member/caregiver    Family Member Consulted Husband [name]             Patient will benefit from skilled therapeutic intervention in order to improve the following deficits and impairments:  Decreased cognition, Decreased strength, Decreased balance, Decreased endurance, Abnormal gait, Decreased knowledge of use of DME, Improper body mechanics, Pain, Decreased coordination, Decreased mobility, Decreased activity tolerance, Decreased range of motion, Hypomobility, Impaired perceived functional ability, Impaired flexibility, Difficulty walking  Visit Diagnosis: Pain in left hip  Difficulty in walking, not elsewhere classified  Muscle weakness (generalized)  Unsteadiness on feet  Other symptoms and signs involving the nervous system     Problem List Patient Active Problem List   Diagnosis Date Noted   Confusion 06/21/2017    Luretha Murphy. Ilsa Iha, PT, DPT 03/31/21, 9:42 AM   [place] [facility] PHYSICAL AND SPORTS MEDICINE [address] [place],  [address] Phone: [phone]   Fax:  [phone]  Name: SIDDALEE VANDERHEIDEN MRN: [id number] Date of Birth: 11-04-1945

## 2021-04-05 ENCOUNTER — Ambulatory Visit: Payer: Medicare HMO | Admitting: Physical Therapy

## 2021-04-05 ENCOUNTER — Encounter: Payer: Self-pay | Admitting: Physical Therapy

## 2021-04-05 ENCOUNTER — Other Ambulatory Visit: Payer: Self-pay

## 2021-04-05 DIAGNOSIS — R262 Difficulty in walking, not elsewhere classified: Secondary | ICD-10-CM

## 2021-04-05 DIAGNOSIS — M6281 Muscle weakness (generalized): Secondary | ICD-10-CM

## 2021-04-05 DIAGNOSIS — M25552 Pain in left hip: Secondary | ICD-10-CM

## 2021-04-05 DIAGNOSIS — R2681 Unsteadiness on feet: Secondary | ICD-10-CM

## 2021-04-05 DIAGNOSIS — R29818 Other symptoms and signs involving the nervous system: Secondary | ICD-10-CM

## 2021-04-05 NOTE — Therapy (Signed)
[facility] PHYSICAL AND SPORTS MEDICINE [address] [place], [place], [address] Phone: [phone]   Fax:  [phone]  Physical Therapy Treatment  Patient Details  Name: Tina ChickDiane N Nissen MRN: [id number] Date of Birth: 05-25-1946 Referring Provider (PT): Baldwin [place], [place]   Encounter Date: 04/05/2021   PT End of Session - 04/05/21 1131     Visit Number 4    Number of Visits 24    Date for PT Re-Evaluation 06/15/21    Authorization Type [employer] MEDICARE reporting period from 03/23/2021    Progress Note Due on Visit 10    PT Start Time 1120    PT Stop Time 1200    PT Time Calculation (min) 40 min    Activity Tolerance Patient tolerated treatment well    Behavior During Therapy [facility] for tasks assessed/performed             Past Medical History:  Diagnosis Date   Asthma    COPD (chronic obstructive pulmonary disease) (HCC)    Fibromyalgia    Heart disease    Hypertension    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   Slurred speech    foggy minded    Past Surgical History:  Procedure Laterality Date   ABDOMINAL HYSTERECTOMY     APPENDECTOMY     BACK SURGERY     BREAST EXCISIONAL BIOPSY Left    2000's   CATARACT EXTRACTION     Also, retina surgery   CHOLECYSTECTOMY     SHOULDER SURGERY      There were no vitals filed for this visit.   Subjective Assessment - 04/05/21 1128     Subjective Patient reports no hip pain upon arrival and states her hip has been feeling better. She fell while using the bathroom at night on Thursday night last week and her back is ore on the left side (5/10). States it is getting better. has been doing HEP daily without increased pain. Arrives with husband Pretson accompanying her [facility] the [facility] and [facility] held assistance. During visit patient asks how long  she needs to come to PT. State she is no longer having trouble with walking, sleeping, ADLs/dressing, getting up and down from a chair, etc, due to L hip pain since Thursday.    Patient is accompained by: Family member    Pertinent History Patient is a 75 y.o. female who presents to outpatient physical therapy with a referral for medical diagnosis trochanteric bursitis of left hip. This patient's chief complaints consist of left lateral hip pain after a fall leading to the following functional deficits: increased difficulty with walking, sleeping, ADLs/dressing, getting up and down from a chair, etc, and decreased quality of life..  Relevant past medical history and comorbidities include atypical parkinson's, asthma, COPD, fibromyalgia, HTN, slurred speech ("foggy minded"), hx of 2 back surgeries (fusion at L4-5 per pt), cholecystectomy, abdominal hysterectomy, cataract extraction and retina surgery.  Patient denies hx of cancer, stroke, seizures, lung problem, major cardiac events, diabetes, unexplained weight loss, changes in bowel or bladder problems, new onset stumbling or dropping things apart from described below.    Limitations Walking;House hold activities;Other (comment)   : increased difficulty with walking, sleeping, ADLs/dressing, getting up and down from a chair, etc.   Diagnostic tests hip xray negative for fracture.  xray report left hip  02/22/2021: "An anteroposterior view of the pelvis and anteroposterior and lateral views of the left hip were obtained.  Images reveal mild degenerative   changes to the femoral acetabular joint.  Large bone callus noted at   previous pubic rami fracture site.  Callus formation noted previously on   x-rays from 03/19/2020." lumbar spine xray report 02/22/2021: "Anteroposterior, oblique, and lateral views of the lumbar spine were  obtained.  Images reveal severe degenerative changes throughout the lumbar  spine.  Significant scoliosis noted.  Severe loss of disc space  noted at  L1-L2, L2-L3.  No fractures noted."    Patient Stated Goals "would like to get rid of this pain" (that she had in her left hip).    Currently in Pain? Yes    Pain Score 5     Pain Location Back    Pain Orientation Left    Pain Onset More than a month ago             TREATMENT:  Therapeutic exercise: to centralize symptoms and improve ROM, strength, muscular endurance, and activity tolerance required for successful completion of functional activities.  - hooklying hip abduction against strap with wide stance, 3x10 with 5 second hold (no pain). - hooklying wide stance hip adduction against yoga block, 3x10, 5 second holds (no pain) - hooklying wide stance bridge, 3x10, 5 second hold. (No pain) - hooklying abdominal brace with marching, 3x10 each side. (No pain) Demo fair trunk control. - Education on HEP including handout  - education on POC options (patient and husband)   (Manual therapy - see below)   - ambulation with SPC and min A around clinic to/from room, waiting room - sit <> stand with min A from/to plinth - Education on HEP including handout.   Pt required multimodal cuing for proper technique and to facilitate improved neuromuscular control, strength, range of motion, and functional ability resulting in improved performance and form. needs help counting both reps and holds.   Manual therapy: to reduce pain and tissue tension, improve range of motion, neuromodulation, in order to promote improved ability to complete functional activities. - hooklying LAD through each hip, grade II-III, 3x30 seconds each side (pain gone after).     HOME EXERCISE PROGRAM Access Code: [email or web address] URL: [email or web address] Date: 04/05/2021 Prepared by: Norton Blizzard  Program Notes Please perform in the bed (not the floor since it is hard to get up).  Keeping feet wider than hips will decrease potential for pain at the hip.    Exercises Hooklying Isometric Hip  Abduction with Belt - 1 x daily - 2-3 sets - 10 reps - 4 reps hold Bridge - 1 x daily - 2-3 sets - 10 reps - 4 seconds hold Supine Hip Adduction Isometric with Ball - 1 x daily - 2-3 sets - 10 reps - 4 seconds hold Supine March - 1 x daily - 2-3 sets - 10 reps - 1 seconds hold     PT Education - 04/05/21 1130     Education Details Exercise purpose/form. Self management techniques, POC    Person(s) Educated Patient;Spouse    Methods Explanation;Demonstration;Tactile cues;Verbal cues;Handout    Comprehension Verbalized understanding;Returned demonstration;Verbal cues required;Tactile cues required;Need further instruction              PT Short Term Goals - 03/29/21 1424       PT SHORT TERM GOAL #1   Title Be able to participate in initial home exercise program with assistance from spouse  for home-management of symptoms.    Baseline initial HEP provided at IE (03/23/2021);    Time 2    Period Weeks    Status Achieved    Target Date 04/06/21               PT Long Term Goals - 03/23/21 1623       PT LONG TERM GOAL #1   Title Be participating in long home exercise program with assistance from spouse for home-management of symptoms.    Baseline initial HEP provded at IE (03/23/2021);    Time 12    Period Weeks    Status New   TARGET DATE FOR ALL LONG TERM GOALS: 06/15/2021   Target Date --   TARGET DATE FOR ALL LONG TERM GOALS: 06/15/2021     PT LONG TERM GOAL #2   Title Patient (> 23 years old) will complete five times sit to stand test from 18.5 inch surface in < 20 seconds indicating an increased LE strength and improved balance.    Baseline 43 seconds with BUE support from plinth and heavy use of back of legs on plinth (did not come to independently balanced stand due to consistent leaning on plinth), lacks control on descent. Reports L hip pain (03/23/2021);    Time 12    Period Weeks    Status New      PT LONG TERM GOAL #3   Title Reduce pain with functional activities  to equal or less than 2/10 to allow patient to complete usual activities including ADLs, IADLs, and functional mobility with less difficulty.    Baseline up to 7/10 (03/23/2021);    Time 12    Period Weeks    Status New      PT LONG TERM GOAL #4   Title Complete community, work and/or recreational activities without limitation due to current condition.    Baseline Functional Limitations: increased difficulty with walking, sleeping, ADLs/dressing, getting up and down from a chair, etc (03/23/2021);    Time 12    Period Weeks    Status New      PT LONG TERM GOAL #5   Title Improve B hip abduction strength to 4+/5 with no increase in pain to improve patient's ability to complete  valued functional tasks such as walking, transfering with less difficulty.    Baseline left hip abduction 4/5 and painful (03/23/2021);    Time 12    Period Weeks    Status New                   Plan - 04/05/21 1501     Clinical Impression Statement Patient tolerated treatment well with no reproduction of L hip throughout session. Continues to require assistance for counting reps and holds as well as min A for walking and transfers. Patient and husband educated on some options for current POC including attempting to continue well tolerated basic HEP that she currently is doing to see if it is sufficient to keep pain improved to satisfaction for the next two weeks. Plan to resume care for gradual progression in 2 weeks if pain is not satisfactory controlled at her current activity level with the current HEP alone. Discussed importance of balance training and strengthening with offer to refer to main PT clinic where there are neuro specialists, but patient did not want to participate in this. Patient would benefit from continued management of limiting condition by skilled physical therapist to address remaining impairments and functional limitations  to work towards stated goals and return to PLOF or maximal functional  independence.    Personal Factors and Comorbidities Age;Comorbidity 3+;Time since onset of injury/illness/exacerbation;Past/Current Experience    Comorbidities atypical parkinson's, asthma, COPD, fibromyalgia, HTN, slurred speech ("foggy minded"), hx of 2 back surgeries (fusion at L4-5 per pt), cholecystectomy, abdominal hysterectomy, cataract extraction and retina surgery.    Examination-Activity Limitations Squat;Stairs;Stand;Sleep;Transfers;Dressing;Locomotion Level;Bed Mobility    Examination-Participation Restrictions Community Activity;Interpersonal Relationship;Other   increased difficulty with walking, sleeping, ADLs/dressing, getting up and down from a chair, etc.   Stability/Clinical Decision Making Evolving/Moderate complexity    Rehab Potential Good    PT Frequency 2x / week    PT Duration 12 weeks    PT Treatment/Interventions Therapeutic activities;Therapeutic exercise;Neuromuscular re-education;Patient/family education;ADLs/Self Care Home Management;Cryotherapy;Electrical Stimulation;Moist Heat;Aquatic Therapy;DME Instruction;Gait training;Stair training;Balance training;Manual techniques;Dry needling;Passive range of motion;Spinal Manipulations;Joint Manipulations;Functional mobility training;Cognitive remediation    PT Next Visit Plan Trial HEP alone for 2 weeks. If pain returns continue progressive hip strengthening/loading as tolerated, balance activities    PT Home Exercise Plan Mmedbridge Access Code: [email or web address]    Consulted and Agree with Plan of Care Patient;Family member/caregiver    Family Member Consulted Husband [name]             Patient will benefit from skilled therapeutic intervention in order to improve the following deficits and impairments:  Decreased cognition, Decreased strength, Decreased balance, Decreased endurance, Abnormal gait, Decreased knowledge of use of DME, Improper body mechanics, Pain, Decreased coordination, Decreased mobility, Decreased activity  tolerance, Decreased range of motion, Hypomobility, Impaired perceived functional ability, Impaired flexibility, Difficulty walking  Visit Diagnosis: Pain in left hip  Difficulty in walking, not elsewhere classified  Muscle weakness (generalized)  Unsteadiness on feet  Other symptoms and signs involving the nervous system     Problem List Patient Active Problem List   Diagnosis Date Noted   Confusion 06/21/2017    Luretha Murphy. Ilsa Iha, PT, DPT 04/05/21, 3:03 PM   [facility] PHYSICAL AND SPORTS MEDICINE [address] [place], [address] Phone: [phone]   Fax:  [phone]  Name: CHARNAY NAZARIO MRN: [id number] Date of Birth: 1946/07/23

## 2021-04-07 ENCOUNTER — Encounter: Payer: Medicare HMO | Admitting: Physical Therapy

## 2021-04-11 ENCOUNTER — Encounter: Payer: Medicare HMO | Admitting: Physical Therapy

## 2021-04-13 ENCOUNTER — Ambulatory Visit: Admission: RE | Admit: 2021-04-13 | Payer: Medicare HMO | Source: Home / Self Care | Admitting: Internal Medicine

## 2021-04-13 ENCOUNTER — Encounter: Admission: RE | Payer: Self-pay | Source: Home / Self Care

## 2021-04-13 SURGERY — COLONOSCOPY WITH PROPOFOL
Anesthesia: General

## 2021-04-14 ENCOUNTER — Encounter: Payer: Medicare HMO | Admitting: Physical Therapy

## 2021-04-18 ENCOUNTER — Telehealth: Payer: Self-pay | Admitting: Physical Therapy

## 2021-04-18 ENCOUNTER — Encounter: Payer: Self-pay | Admitting: Physical Therapy

## 2021-04-18 ENCOUNTER — Ambulatory Visit: Payer: Medicare HMO | Admitting: Physical Therapy

## 2021-04-18 DIAGNOSIS — R2681 Unsteadiness on feet: Secondary | ICD-10-CM

## 2021-04-18 DIAGNOSIS — M6281 Muscle weakness (generalized): Secondary | ICD-10-CM

## 2021-04-18 DIAGNOSIS — M25552 Pain in left hip: Secondary | ICD-10-CM

## 2021-04-18 DIAGNOSIS — R262 Difficulty in walking, not elsewhere classified: Secondary | ICD-10-CM

## 2021-04-18 NOTE — Telephone Encounter (Addendum)
Called patient after she did not come to her appointment today at 9am. Communication was inhibited due to patient's expressive aphasia and intermittently garbled speech. Stated her husband, [name], was not home at this time to talk to but said PT could call back to talk to him later and agreed that he could call clinic as well to talk to PT to improve communication. Notified patient of her missed visit. Explained that PT had reviewed chart and it appeared that PCP had made a referral for vestibular PT (for our main neuro clinic) but that PT recommends PT for parkinson's and balance issues (not vestibular unless something changed recently).  Let patient know that this PT faxed a PT order (on Friday 04/15/2021) PCP for him to sign if he agrees for PT for parkinson's related balance issues and left hip pain. Explained to patient that we will wait for that order to be returned to get process of going to main PT clinic started. Confirmed that she wanted to come to next PT appointment scheduled at 9am this Thursday 04/21/2021 and that she would be seen by Josh. Patient confirmed time.   Luretha Murphy. Ilsa Iha, PT, DPT 04/18/21, 12:21 PM   Called [name] (patient's husband) back after he called a couple of times this afternoon and I could not come to the phone. [name] said they had misunderstood and did not realize she had any more appointments with me at the [facility]. Confirmed that they had spoken with Dr. Marcello Fennel about going to Main clinic for parkinson's specific rehab but had not heard anything back yet. I explained to him I had seen documentation that suggested the PT referral they made was for vestibular therapy, which was not what I was recommending so I faxed an order for Dr. Marcello Fennel to sign if he agrees for Parkinson's/balance rehab last Friday but has not yet heard back. Reviewed upcoming PT visits with [name] and he said Nahiara's hip is no longer hurting and she does not want to come to any further PT visits for  the hip at [facility] location. Agreed to remove all future PT sessions at [facility] from the schedule. Let preston know I will be out of town until 04/28/2021 but Kendal Hymen at our clinic is watching for the referral to be returned and I will follow up with it when I get back. [name] agreed.   Luretha Murphy. Ilsa Iha, PT, DPT 04/18/21, 8:02 PM

## 2021-04-18 NOTE — Therapy (Signed)
[facility] PH[facility] SPORTS MEDICINE 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   Subjective Assessment - 04/18/21 2009     Subjective Spoke with patient and husband Tina Bowman today by phone and they requested discharge from PT for hip due to improvement in condition. Patient is no longer having hip pain. She would like to start PT for Parkinson's and balance at main clinic. In the process of getting PT order for this with help from PT.    Pertinent History Patient is a 75 y.o. female who presents to outpatient physical therapy with a referral for medical diagnosis trochanteric bursitis of left hip. This patient's chief complaints consist of left lateral hip pain after a fall leading to the following functional deficits: increased difficulty with walking, sleeping, ADLs/dressing, getting up and down from a chair, etc, and decreased quality of life..  Relevant past medical history and comorbidities include atypical  parkinson's, asthma, COPD, fibromyalgia, HTN, slurred speech ("foggy minded"), hx of 2 back surgeries (fusion at L4-5 per pt), cholecystectomy, abdominal hysterectomy, cataract extraction and retina surgery.  Patient denies hx of cancer, stroke, seizures, lung problem, major cardiac events, diabetes, unexplained weight loss, changes in bowel or bladder problems, new onset stumbling or dropping things apart from described below.    Limitations Walking;House hold activities;Other (comment)   : increased difficulty with walking, sleeping, ADLs/dressing, getting up and down from a chair, etc.   Diagnostic tests hip xray negative for fracture.  xray report left hip 02/22/2021: "An anteroposterior view of the pelvis and anteroposterior and lateral views of the left hip were obtained.  Images reveal mild degenerative   changes to the femoral acetabular joint.  Large bone callus noted at   previous pubic rami fracture site.  Callus formation noted previously on   x-rays from 03/19/2020." lumbar spine xray report 02/22/2021: "Anteroposterior, oblique, and lateral views of the lumbar spine were  obtained.  Images reveal severe degenerative changes throughout the lumbar  spine.  Significant scoliosis noted.  Severe loss of disc space noted at  L1-L2, L2-L3.  No fractures noted."    Patient Stated Goals "would like to get rid of this pain" (that she had in her left hip).             OBJECTIVE Patient is not present for examination at this time. Please see previous documentation for latest objective data.  PT Short Term Goals - 03/29/21 1424       PT SHORT TERM GOAL #1   Title Be able to participate in initial home exercise program with assistance from spouse for home-management of symptoms.    Baseline initial HEP provided at IE (03/23/2021);    Time 2    Period Weeks    Status Achieved    Target Date 04/06/21               PT Long Term Goals - 04/18/21 2011       PT LONG TERM GOAL #1   Title  Be participating in long home exercise program with assistance from spouse for home-management of symptoms.    Baseline initial HEP provded at IE (03/23/2021);    Time 12    Period Weeks    Status Achieved   TARGET DATE FOR ALL LONG TERM GOALS: 06/15/2021     PT LONG TERM GOAL #2   Title Patient (> 75 years old) will complete five times sit to stand test from 18.5 inch surface in < 20 seconds indicating an increased LE strength and improved balance.    Baseline 43 seconds with BUE support from plinth and heavy use of back of legs on plinth (did not come to independently balanced stand due to consistent leaning on plinth), lacks control on descent. Reports L hip pain (03/23/2021);    Time 12    Period Weeks    Status Unable to assess      PT LONG TERM GOAL #3   Title Reduce pain with functional activities to equal or less than 2/10 to allow patient to complete usual activities including ADLs, IADLs, and functional mobility with less difficulty.    Baseline up to 7/10 (03/23/2021); reports no pain (04/18/2021);    Time 12    Period Weeks    Status Achieved      PT LONG TERM GOAL #4   Title Complete community, work and/or recreational activities without limitation due to current condition.    Baseline Functional Limitations: increased difficulty with walking, sleeping, ADLs/dressing, getting up and down from a chair, etc (03/23/2021); no longer limited by hip pain (04/18/2021);    Time 12    Period Weeks    Status Achieved      PT LONG TERM GOAL #5   Title Improve B hip abduction strength to 4+/5 with no increase in pain to improve patient's ability to complete  valued functional tasks such as walking, transfering with less difficulty.    Baseline left hip abduction 4/5 and painful (03/23/2021);    Time 12    Period Weeks    Status Unable to assess               Plan - 04/18/21 2015     Clinical Impression Statement Patient attended 4 physical therapy sessions this episode of care to  address L hip pain. Patient reported good ability to participate in isometric hip strengthening HEP at visit 4 and wanted to discharge from PT for left hip as long as this was still feeling better two weeks later. Patient and husband now report her hip continues to be better and she no longer wishes to continue PT for the hip due to improvement in condition. Is still struggling with falls and unsteadiness related to Parkinson's and would like to start PT at hospital clinic with more specialized treatment for Parkinson's. In process of getting referral for that with PT's help and recommendation. Is  now discharged from PT for left hip at this time due to improvement in condition and satisfaction of patient.    Personal Factors and Comorbidities Age;Comorbidity 3+;Time since onset of injury/illness/exacerbation;Past/Current Experience    Comorbidities atypical parkinson's, asthma, COPD, fibromyalgia, HTN, slurred speech ("foggy minded"), hx of 2 back surgeries (fusion at L4-5 per pt), cholecystectomy, abdominal hysterectomy, cataract extraction and retina surgery.    Examination-Activity Limitations Squat;Stairs;Stand;Sleep;Transfers;Dressing;Locomotion Level;Bed Mobility    Examination-Participation Restrictions Community Activity;Interpersonal Relationship;Other   increased difficulty with walking, sleeping, ADLs/dressing, getting up and down from a chair, etc.   Stability/Clinical Decision Making Evolving/Moderate complexity    Rehab Potential Good    PT Frequency 2x / week    PT Duration 12 weeks    PT Treatment/Interventions Therapeutic activities;Therapeutic exercise;Neuromuscular re-education;Patient/family education;ADLs/Self Care Home Management;Cryotherapy;Electrical Stimulation;Moist Heat;Aquatic Therapy;DME Instruction;Gait training;Stair training;Balance training;Manual techniques;Dry needling;Passive range of motion;Spinal Manipulations;Joint Manipulations;Functional mobility training;Cognitive  remediation    PT Next Visit Plan Patient is now discharged from PT for L hip pain.    PT Home Exercise Plan Mmedbridge Access Code: [email or web address]    Consulted and Agree with Plan of Care Patient;Family member/caregiver    Family Member Consulted Husband Tina Bowman             Patient will benefit from skilled therapeutic intervention in order to improve the following deficits and impairments:  Decreased cognition, Decreased strength, Decreased balance, Decreased endurance, Abnormal gait, Decreased knowledge of use of DME, Improper body mechanics, Pain, Decreased coordination, Decreased mobility, Decreased activity tolerance, Decreased range of motion, Hypomobility, Impaired perceived functional ability, Impaired flexibility, Difficulty walking  Visit Diagnosis: Pain in left hip  Difficulty in walking, not elsewhere classified  Muscle weakness (generalized)  Unsteadiness on feet     Problem List Patient Active Problem List   Diagnosis Date Noted   Confusion 06/21/2017    Luretha Murphy. Ilsa Iha, PT, DPT 04/18/21, 8:16 PM  [facility] PHYSICAL AND SPORTS MEDICINE [address], [place], [address] Phone: [phone]   Fax:  [phone]  Name: Tina Bowman MRN: [id number] Date of Birth: [birth year]

## 2021-04-21 ENCOUNTER — Ambulatory Visit: Payer: Medicare HMO | Admitting: Physical Therapy

## 2021-04-26 ENCOUNTER — Ambulatory Visit: Payer: Medicare HMO | Admitting: Physical Therapy

## 2021-04-28 ENCOUNTER — Ambulatory Visit: Payer: Medicare HMO | Admitting: Physical Therapy

## 2021-05-03 ENCOUNTER — Encounter: Payer: Medicare HMO | Admitting: Physical Therapy

## 2021-05-05 ENCOUNTER — Encounter: Payer: Medicare HMO | Admitting: Physical Therapy

## 2021-05-10 ENCOUNTER — Encounter: Payer: Medicare HMO | Admitting: Physical Therapy

## 2021-05-10 ENCOUNTER — Encounter: Payer: Self-pay | Admitting: Internal Medicine

## 2021-05-11 ENCOUNTER — Ambulatory Visit
Admission: RE | Admit: 2021-05-11 | Discharge: 2021-05-11 | Disposition: A | Discharge status: Home / Self Care | Payer: Medicare HMO | Attending: Internal Medicine | Admitting: Internal Medicine

## 2021-05-11 ENCOUNTER — Encounter: Payer: Self-pay | Admitting: Internal Medicine

## 2021-05-11 ENCOUNTER — Ambulatory Visit: Payer: Medicare HMO | Admitting: Certified Registered"

## 2021-05-11 ENCOUNTER — Encounter
Admission: RE | Disposition: A | Discharge status: Home / Self Care | Payer: Self-pay | Source: Home / Self Care | Attending: Internal Medicine

## 2021-05-11 DIAGNOSIS — Z1211 Encounter for screening for malignant neoplasm of colon: Secondary | ICD-10-CM | POA: Insufficient documentation

## 2021-05-11 DIAGNOSIS — K635 Polyp of colon: Secondary | ICD-10-CM | POA: Diagnosis not present

## 2021-05-11 DIAGNOSIS — Z8371 Family history of colonic polyps: Secondary | ICD-10-CM | POA: Insufficient documentation

## 2021-05-11 DIAGNOSIS — K640 First degree hemorrhoids: Secondary | ICD-10-CM | POA: Diagnosis not present

## 2021-05-11 DIAGNOSIS — K5730 Diverticulosis of large intestine without perforation or abscess without bleeding: Secondary | ICD-10-CM | POA: Insufficient documentation

## 2021-05-11 HISTORY — DX: Unspecified osteoarthritis, unspecified site: M19.90

## 2021-05-11 HISTORY — PX: COLONOSCOPY: SHX5424

## 2021-05-11 HISTORY — DX: Sleep apnea, unspecified: G47.30

## 2021-05-11 HISTORY — DX: Parkinson's disease: G20

## 2021-05-11 HISTORY — DX: Parkinsonism, unspecified: G20.C

## 2021-05-11 SURGERY — COLONOSCOPY
Anesthesia: General

## 2021-05-11 MED ORDER — SODIUM CHLORIDE 0.9 % IV SOLN: INTRAVENOUS | Status: DC

## 2021-05-11 MED ORDER — LIDOCAINE HCL (CARDIAC) PF 100 MG/5ML IV SOSY
PREFILLED_SYRINGE | INTRAVENOUS | Status: DC | PRN
  Administered 2021-05-11: 50 mg via INTRAVENOUS

## 2021-05-11 MED ORDER — PROPOFOL 10 MG/ML IV BOLUS
INTRAVENOUS | Status: AC
  Filled 2021-05-11: qty 20

## 2021-05-11 MED ORDER — PROPOFOL 10 MG/ML IV BOLUS
INTRAVENOUS | Status: DC | PRN
  Administered 2021-05-11: 20 mg via INTRAVENOUS
  Administered 2021-05-11: 60 mg via INTRAVENOUS

## 2021-05-11 MED ORDER — PROPOFOL 500 MG/50ML IV EMUL
INTRAVENOUS | Status: DC | PRN
  Administered 2021-05-11: 150 ug/kg/min via INTRAVENOUS

## 2021-05-11 NOTE — Interval H&P Note (Signed)
History and Physical Interval Note:  05/11/2021 9:23 AM  Tina Bowman  has presented today for surgery, with the diagnosis of (Z83.71) family hisoty of polyps in the colon.  The various methods of treatment have been discussed with the patient and family. After consideration of risks, benefits and other options for treatment, the patient has consented to  Procedure(s) with comments: COLONOSCOPY (N/A) - PARKINSON'S as a surgical intervention.  The patient's history has been reviewed, patient examined, no change in status, stable for surgery.  I have reviewed the patient's chart and labs.  Questions were answered to the patient's satisfaction.     Strang, Willow

## 2021-05-11 NOTE — Anesthesia Preprocedure Evaluation (Signed)
Anesthesia Evaluation  Patient identified by MRN, date of birth, ID band Patient awake    Reviewed: Allergy & Precautions, NPO status , Patient's Chart, lab work & pertinent test results  History of Anesthesia Complications Negative for: history of anesthetic complications  Airway Mallampati: III  TM Distance: <3 FB Neck ROM: limited    Dental  (+) Chipped   Pulmonary asthma , sleep apnea , COPD,    Pulmonary exam normal        Cardiovascular hypertension, (-) anginaNormal cardiovascular exam     Neuro/Psych PSYCHIATRIC DISORDERS  Neuromuscular disease    GI/Hepatic negative GI ROS, Neg liver ROS, neg GERD  ,  Endo/Other  negative endocrine ROS  Renal/GU negative Renal ROS  negative genitourinary   Musculoskeletal  (+) Arthritis , Fibromyalgia -  Abdominal   Peds  Hematology negative hematology ROS (+)   Anesthesia Other Findings Past Medical History: No date: Arthritis No date: Asthma No date: COPD (chronic obstructive pulmonary disease) (HCC) No date: Fibromyalgia No date: Heart disease No date: Hypertension No date: 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 No date: Sleep apnea No date: Slurred speech     Comment:  foggy minded  Past Surgical History: No date: ABDOMINAL HYSTERECTOMY No date: APPENDECTOMY No date: BACK SURGERY No date: BREAST EXCISIONAL BIOPSY; Left     Comment:  2000's No date: BREAST SURGERY No date: CATARACT EXTRACTION     Comment:  Also, retina surgery No date: CHOLECYSTECTOMY No date: EYE SURGERY No date: FRACTURE SURGERY No date: NARCOTIC PAIN PUMP IMPLANT No date: SHOULDER SURGERY No date: TONSILLECTOMY No date: TUBAL LIGATION  BMI    Body Mass Index: 28.35 kg/m       Reproductive/Obstetrics negative OB ROS                             Anesthesia Physical Anesthesia Plan  ASA: 3  Anesthesia Plan: General   Post-op Pain Management:    Induction: Intravenous  PONV Risk Score and Plan: Propofol infusion and TIVA  Airway Management Planned: Natural Airway and Nasal Cannula  Additional Equipment:   Intra-op Plan:   Post-operative Plan:   Informed Consent: I have reviewed the patients History and Physical, chart, labs and discussed the procedure including the risks, benefits and alternatives for the proposed anesthesia with the patient or authorized representative who has indicated his/her understanding and acceptance.     Dental Advisory Given  Plan Discussed with: Anesthesiologist, CRNA and Surgeon  Anesthesia Plan Comments: (Patient consented for risks of anesthesia including but not limited to:  - adverse reactions to medications - risk of airway placement if required - damage to eyes, teeth, lips or other oral mucosa - nerve damage due to positioning  - sore throat or hoarseness - Damage to heart, brain, nerves, lungs, other parts of body or loss of life  Patient voiced understanding.)        Anesthesia Quick Evaluation

## 2021-05-11 NOTE — Anesthesia Postprocedure Evaluation (Signed)
Anesthesia Post Note  Patient: Tina Bowman  Procedure(s) Performed: COLONOSCOPY  Patient location during evaluation: Endoscopy Anesthesia Type: General Level of consciousness: awake and alert Pain management: pain level controlled Vital Signs Assessment: post-procedure vital signs reviewed and stable Respiratory status: spontaneous breathing, nonlabored ventilation, respiratory function stable and patient connected to nasal cannula oxygen Cardiovascular status: blood pressure returned to baseline and stable Postop Assessment: no apparent nausea or vomiting Anesthetic complications: no   No notable events documented.   Last Vitals:  Vitals:   05/11/21 1015 05/11/21 1024  BP: 139/79 (!) 154/74  Pulse: 71 77  Resp: 16 16  Temp: (!) 35.9 C   SpO2: 98% 98%    Last Pain:  Vitals:   05/11/21 1024  TempSrc:   PainSc: 0-No pain                 Cleda Mccreedy [place]

## 2021-05-11 NOTE — H&P (Signed)
Outpatient short stay form Pre-procedure 05/11/2021 9:22 AM Tina Bowman K. Tina Bowman, M.D.  Primary Physician: Barbette Reichmann, M.D.  Reason for visit:  Family history of colon polyps  History of present illness:   75 year old patient presenting for family history of colon polyps. Patient denies any change in bowel habits, rectal bleeding or involuntary weight loss.    No current facility-administered medications for this encounter.  Medications Prior to Admission  Medication Sig Dispense Refill Last Dose   atorvastatin (LIPITOR) 40 MG tablet Take 40 mg by mouth every evening.   05/10/2021   calcium-vitamin D (OSCAL WITH D) 500-200 MG-UNIT per tablet Take 1 tablet by mouth.   Past Week   enalapril (VASOTEC) 20 MG tablet Take 20 mg by mouth daily.   05/11/2021 at 0600   hydrALAZINE (APRESOLINE) 25 MG tablet Take 25 mg by mouth 2 (two) times daily.   05/11/2021 at 0600   loratadine (CLARITIN) 10 MG tablet Take 10 mg by mouth daily.   05/10/2021   metoprolol (LOPRESSOR) 50 MG tablet Take 50 mg by mouth 2 (two) times daily.   05/11/2021 at 0600   montelukast (SINGULAIR) 10 MG tablet Take 10 mg by mouth at bedtime.   05/10/2021   omeprazole (PRILOSEC) 40 MG capsule Take 40 mg by mouth daily.   05/10/2021   ranitidine (ZANTAC) 150 MG tablet Take 150 mg by mouth at bedtime.    05/10/2021   umeclidinium bromide (INCRUSE ELLIPTA) 62.5 MCG/INH AEPB Inhale 1 puff into the lungs daily.   05/11/2021 at 0600   albuterol (PROVENTIL HFA;VENTOLIN HFA) 108 (90 Base) MCG/ACT inhaler Inhale 2 puffs into the lungs every 6 (six) hours as needed for wheezing or shortness of breath.      aspirin 325 MG tablet Take 325 mg by mouth daily.      HYDROcodone-acetaminophen (NORCO/VICODIN) 5-325 MG tablet Take 1 tablet by mouth every 4 (four) hours as needed for moderate pain. 20 tablet 0    levalbuterol (XOPENEX) 1.25 MG/3ML nebulizer solution Take 1.25 mg by nebulization every 4 (four) hours as needed for wheezing or shortness of  breath.      traMADol (ULTRAM) 50 MG tablet Take by mouth every 6 (six) hours as needed.        Allergies  Allergen Reactions   Methadone Other (See Comments)    Pt doesn't know   Niacin And Related Other (See Comments)    Gives her hot flashes   Oxycontin [Oxycodone] Other (See Comments)    Makes her feel strange.   Sulfa Antibiotics Other (See Comments)    Pt doesn't know   Other Rash    Band aids (different brands) causes pt to break out in a rash.      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   Sleep apnea    Slurred speech    foggy minded    Review of systems:  Otherwise negative.    Physical Exam  Gen: Alert, oriented. Appears stated age.  HEENT: [place]/AT. PERRLA. Lungs: CTA, no wheezes. CV: RR nl S1, S2. Abd: soft, benign, no masses. BS+ Ext: No edema.  Pulses 2+    Planned procedures: Proceed with colonoscopy. The patient understands the nature of the planned procedure, indications, risks, alternatives and potential complications including but not limited to bleeding, infection, perforation, damage to internal organs and possible oversedation/side effects from anesthesia. The patient agrees and gives consent to proceed.  Please refer to procedure notes for findings, recommendations and patient disposition/instructions.     Tina Bowman K. Tina Bowman, M.D. Gastroenterology 05/11/2021  9:22 AM

## 2021-05-11 NOTE — Transfer of Care (Signed)
Immediate Anesthesia Transfer of Care Note  Patient: Tina Bowman  Procedure(s) Performed: COLONOSCOPY  Patient Location: PACU  Anesthesia Type:General  Level of Consciousness: drowsy  Airway & Oxygen Therapy: Patient Spontanous Breathing  Post-op Assessment: Report given to RN and Post -op Vital signs reviewed and stable  Post vital signs: Reviewed and stable  Last Vitals:  Vitals Value Taken Time  BP 115/64 05/11/21 0955  Temp    Pulse 70 05/11/21 0955  Resp 13 05/11/21 0955  SpO2 100 % 05/11/21 0955  Vitals shown include unvalidated device data.  Last Pain:  Vitals:   05/11/21 0911  TempSrc: Temporal  PainSc: 0-No pain         Complications: No notable events documented.

## 2021-05-11 NOTE — Op Note (Signed)
[facility] Gastroenterology Patient Name: Tina SkeenDiane Turbin Procedure Date: 05/11/2021 9:32 AM MRN: [id number] Account #: [id number] Date of Birth: 04-Jun-1946 Admit Type: Outpatient Age: 75 [facility] 2 Gender: Female Note Status: Finalized Procedure:             Colonoscopy Indications:           Colon cancer screening in patient at increased risk:                         Family history of 1st-degree relative with colon polyps Providers:             Boykin Nearingeodoro K. Norma Fredricksonoledo MD, MD Referring MD:          Barbette ReichmannVishwanath Hande, MD (Referring MD) Medicines:             Propofol per Anesthesia Complications:         No immediate complications. Procedure:             Pre-Anesthesia Assessment:                        - The risks and benefits of the procedure and the                         sedation options and risks were discussed with the                         patient. All questions were answered and informed                         consent was obtained.                        - Patient identification and proposed procedure were                         verified prior to the procedure by the nurse. The                         procedure was verified in the procedure room.                        - ASA Grade Assessment: III - A patient with severe                         systemic disease.                        - After reviewing the risks and benefits, the patient                         was deemed in satisfactory condition to undergo the                         procedure.                        After obtaining informed consent, the colonoscope was                         passed  under direct vision. Throughout the procedure,                         the patient's blood pressure, pulse, and oxygen                         saturations were monitored continuously. The                         Colonoscope was introduced through the anus and                         advanced to the  the cecum, identified by appendiceal                         orifice and ileocecal valve. The colonoscopy was                         performed without difficulty. The patient tolerated                         the procedure well. The quality of the bowel                         preparation was good. The ileocecal valve, appendiceal                         orifice, and rectum were photographed. Findings:      The perianal and digital rectal examinations were normal. Pertinent       negatives include normal sphincter tone and no palpable rectal lesions.      Non-bleeding internal hemorrhoids were found during retroflexion. The       hemorrhoids were Grade I (internal hemorrhoids that do not prolapse).      Many small-mouthed diverticula were found in the sigmoid colon.      A 5 mm polyp was found in the transverse colon. The polyp was sessile.       The polyp was removed with a jumbo cold forceps. Resection and retrieval       were complete.      The exam was otherwise without abnormality. Impression:            - Non-bleeding internal hemorrhoids.                        - Diverticulosis in the sigmoid colon.                        - One 5 mm polyp in the transverse colon, removed with                         a jumbo cold forceps. Resected and retrieved.                        - The examination was otherwise normal. Recommendation:        - Patient has a contact number available for                         emergencies. The signs and symptoms of potential  delayed complications were discussed with the patient.                         Return to normal activities tomorrow. Written                         discharge instructions were provided to the patient.                        - Resume previous diet.                        - Continue present medications.                        - Repeat colonoscopy is recommended for surveillance.                         The  colonoscopy date will be determined after                         pathology results from today's exam become available                         for review.                        - Return to GI office PRN.                        - The findings and recommendations were discussed with                         the patient. Procedure Code(s):     --- Professional ---                        [phone], Colonoscopy, flexible; with biopsy, single or                         multiple Diagnosis Code(s):     --- Professional ---                        K57.30, Diverticulosis of large intestine without                         perforation or abscess without bleeding                        K63.5, Polyp of colon                        K64.0, First degree hemorrhoids                        Z83.71, Family history of colonic polyps CPT copyright 2019 American Medical Association. All rights reserved. The codes documented in this report are preliminary and upon coder review may  be revised to meet current compliance requirements. Stanton Kidney MD, MD 05/11/2021 9:54:18 AM This report has been signed electronically. Number of Addenda: 0 Note Initiated On: 05/11/2021 9:32 AM Scope Withdrawal Time: 0 hours  6 minutes 31 seconds  Total Procedure Duration: 0 hours 11 minutes 25 seconds  Estimated Blood Loss:  Estimated blood loss: none.      [facility]

## 2021-05-12 ENCOUNTER — Encounter: Payer: Self-pay | Admitting: Internal Medicine

## 2021-05-12 ENCOUNTER — Encounter: Payer: Medicare HMO | Admitting: Physical Therapy

## 2021-05-12 LAB — SURGICAL PATHOLOGY

## 2021-05-17 ENCOUNTER — Encounter: Payer: Medicare HMO | Admitting: Physical Therapy

## 2021-05-19 ENCOUNTER — Encounter: Payer: Medicare HMO | Admitting: Physical Therapy

## 2021-06-04 ENCOUNTER — Other Ambulatory Visit: Payer: Self-pay

## 2021-06-04 ENCOUNTER — Emergency Department
Admission: EM | Admit: 2021-06-04 | Discharge: 2021-06-04 | Disposition: A | Discharge status: Home / Self Care | Payer: Medicare HMO | Attending: Emergency Medicine | Admitting: Emergency Medicine

## 2021-06-04 ENCOUNTER — Emergency Department: Payer: Medicare HMO

## 2021-06-04 DIAGNOSIS — W010XXA Fall on same level from slipping, tripping and stumbling without subsequent striking against object, initial encounter: Secondary | ICD-10-CM | POA: Diagnosis not present

## 2021-06-04 DIAGNOSIS — M25552 Pain in left hip: Secondary | ICD-10-CM | POA: Diagnosis present

## 2021-06-04 DIAGNOSIS — J449 Chronic obstructive pulmonary disease, unspecified: Secondary | ICD-10-CM | POA: Insufficient documentation

## 2021-06-04 DIAGNOSIS — Z7951 Long term (current) use of inhaled steroids: Secondary | ICD-10-CM | POA: Insufficient documentation

## 2021-06-04 DIAGNOSIS — M545 Low back pain, unspecified: Secondary | ICD-10-CM | POA: Diagnosis not present

## 2021-06-04 DIAGNOSIS — Z79899 Other long term (current) drug therapy: Secondary | ICD-10-CM | POA: Insufficient documentation

## 2021-06-04 DIAGNOSIS — M25559 Pain in unspecified hip: Secondary | ICD-10-CM

## 2021-06-04 DIAGNOSIS — Z7982 Long term (current) use of aspirin: Secondary | ICD-10-CM | POA: Diagnosis not present

## 2021-06-04 DIAGNOSIS — J45909 Unspecified asthma, uncomplicated: Secondary | ICD-10-CM | POA: Insufficient documentation

## 2021-06-04 DIAGNOSIS — I10 Essential (primary) hypertension: Secondary | ICD-10-CM | POA: Diagnosis not present

## 2021-06-04 DIAGNOSIS — G20 Parkinson's disease: Secondary | ICD-10-CM | POA: Diagnosis not present

## 2021-06-04 NOTE — ED Triage Notes (Signed)
Pt come with c/o trip and fall. Pt states left hip pain. Pt able to bear weight and has been walking but with some pain.

## 2021-06-04 NOTE — ED Provider Notes (Signed)
[facility]  ____________________________________________   Event Date/Time   First MD Initiated Contact with Patient 06/04/21 1343     (approximate)  I have reviewed the triage vital signs and the nursing notes.   HISTORY  Chief Complaint Fall    HPI Tina Bowman is a 75 y.o. female past medical history of COPD, Parkinson's disease, progressive supranuclear palsy who presents with hip pain.  Patient's husband notes that about 2 weeks ago she had a mechanical fall onto her left hip.  She did not hit her head.  He notes that she tripped.  Since then she has intermittently complained of some left hip/lower back pain.  She has a history of similar after a fall in June and has received corticosteroid injections for this pain in the past.  Her husband wanted to ensure that she did not break any of her bones.  She is still able to ambulate normally.  She is at her baseline in terms of her mental status and ambulatory status per her husband.         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   Sleep apnea    Slurred speech    foggy minded    Patient Active Problem List   Diagnosis Date Noted   Confusion 06/21/2017    Past Surgical History:  Procedure Laterality Date   ABDOMINAL HYSTERECTOMY     APPENDECTOMY     BACK SURGERY     BREAST EXCISIONAL BIOPSY Left    2000's   BREAST SURGERY     CATARACT EXTRACTION     Also, retina surgery   CHOLECYSTECTOMY     COLONOSCOPY N/A 05/11/2021   Procedure: COLONOSCOPY;  Surgeon: Toledo, Boykin Nearing, MD;  Location: [facility] ENDOSCOPY;  Service: Gastroenterology;  Laterality: N/A;  PARKINSON'S    EYE SURGERY     FRACTURE SURGERY     NARCOTIC PAIN PUMP IMPLANT     SHOULDER SURGERY     TONSILLECTOMY     TUBAL LIGATION      Prior to Admission medications   Medication Sig Start Date End Date Taking? Authorizing Provider  albuterol (PROVENTIL HFA;VENTOLIN HFA) 108 (90 Base) MCG/ACT inhaler Inhale 2 puffs into the lungs every 6 (six) hours as needed for wheezing or shortness of breath.    [provider]  aspirin 325 MG tablet Take 325 mg by mouth daily.    [provider]  atorvastatin (LIPITOR) 40 MG tablet Take 40 mg by mouth every evening.    [provider]  calcium-vitamin D (OSCAL WITH D) 500-200 MG-UNIT per tablet Take 1 tablet by mouth.    [provider]  enalapril (VASOTEC) 20 MG tablet Take 20 mg by mouth daily.    [provider]  hydrALAZINE (APRESOLINE) 25 MG tablet Take 25 mg by mouth 2 (two) times daily.    [provider]  HYDROcodone-acetaminophen (NORCO/VICODIN) 5-325 MG tablet Take 1 tablet by mouth every 4 (four) hours as needed for moderate pain. 06/19/18   Cuthriell, Delorise Royals, PA-C  levalbuterol (XOPENEX) 1.25 MG/3ML nebulizer solution Take 1.25 mg by nebulization  every 4 (four) hours as needed for wheezing or shortness of breath.    [provider]  loratadine (CLARITIN) 10 MG tablet Take 10 mg by mouth daily.    [provider]  metoprolol (LOPRESSOR) 50 MG tablet Take 50 mg by mouth 2 (two) times daily.    [provider]  montelukast (SINGULAIR) 10 MG tablet Take 10 mg by mouth at bedtime.    [provider]  omeprazole (PRILOSEC) 40 MG capsule Take 40 mg by mouth daily.    [provider]  ranitidine (ZANTAC) 150 MG tablet Take 150 mg by mouth at bedtime.     [provider]  traMADol (ULTRAM) 50 MG tablet Take by mouth every 6 (six) hours as needed.    [provider]  umeclidinium bromide (INCRUSE ELLIPTA) 62.5 MCG/INH AEPB Inhale 1 puff  into the lungs daily.    [provider]    Allergies Methadone, Niacin and related, Oxycontin [oxycodone], Sulfa antibiotics, and Other  Family History  Problem Relation Age of Onset   Heart attack Mother    Heart disease Father    Breast cancer Cousin    Breast cancer Maternal Aunt    Diabetes Maternal Grandfather     Social History Social History   Tobacco Use   Smoking status: Never   Smokeless tobacco: Never  Vaping Use   Vaping Use: Never used  Substance Use Topics   Alcohol use: Not Currently    Comment: social   Drug use: No    Review of Systems   Review of Systems  Musculoskeletal:  Positive for arthralgias, back pain and myalgias.  Neurological:  Negative for syncope and numbness.  All other systems reviewed and are negative.  Physical Exam Updated Vital Signs BP 135/67   Pulse 80   Temp 98 F (36.7 C)   Resp 18   SpO2 97%   Physical Exam Vitals and nursing note reviewed.  Constitutional:      General: She is not in acute distress.    Appearance: Normal appearance.  HENT:     Head: Normocephalic and atraumatic.  Eyes:     General: No scleral icterus.    Conjunctiva/sclera: Conjunctivae normal.  Pulmonary:     Effort: Pulmonary effort is normal. No respiratory distress.     Breath sounds: No stridor.  Musculoskeletal:        General: No deformity or signs of injury.     Cervical back: Normal range of motion.     Comments: Mild tenderness palpation of the left hip and SI joint No midline lumbar tenderness Pelvis is stable Able to flex both hips fully without pain No pain with logrolling of the left leg  Skin:    General: Skin is dry.     Coloration: Skin is not jaundiced or pale.  Neurological:     Mental Status: She is alert.     Comments: Patient is aphasic 5 out of 5 strength with hip flexion, plantar flexion and dorsiflexion of the bilateral lower extremities  Psychiatric:        Mood and Affect: Mood normal.         Behavior: Behavior normal.     LABS (all labs ordered are listed, but only abnormal results are displayed)  Labs Reviewed - No data to display ____________________________________________  EKG  N/a ____________________________________________  RADIOLOGY Ky Barban, personally viewed and evaluated these images (plain radiographs) as part of my medical decision making, as well  as reviewing the written report by the radiologist.  ED MD interpretation: I reviewed the pelvic and hip x-ray which does not show any acute fracture    ____________________________________________   PROCEDURES  Procedure(s) performed (including Critical Care):  Procedures   ____________________________________________   INITIAL IMPRESSION / ASSESSMENT AND PLAN / ED COURSE     The patient is a 75 year old female with Parkinson's disease who presents 2 weeks after a fall with hip pain.  She has had a history of similar hip/low back pain.  She has been ambulating normally.  Her x-ray today does not show any acute fracture.  On my exam she does have some left hip tenderness but she is able to range her legs has good strength in her lower extremities and is able to weight-bear.  I have very low suspicion for occult hip fracture so no indication for CT imaging at this time. Advised Tylenol and PCP follow-up.      ____________________________________________   FINAL CLINICAL IMPRESSION(S) / ED DIAGNOSES  Final diagnoses:  Hip pain     ED Discharge Orders     None        Note:  This document was prepared using Dragon voice recognition software and may include unintentional dictation errors.    Georga Hacking, MD 06/04/21 1626

## 2021-06-04 NOTE — ED Notes (Signed)
MD at bedside assessing pt at this time.

## 2021-06-04 NOTE — ED Notes (Signed)
Pt seen exiting department in W/C with spouse. Pt did not receive AVS or discharge instructions. Pt was up for discharge.

## 2021-06-11 ENCOUNTER — Inpatient Hospital Stay
Admission: EM | Admit: 2021-06-11 | Discharge: 2021-06-15 | DRG: 481 | Disposition: A | Discharge status: Skilled Nursing Facility | Payer: Medicare HMO | Attending: Internal Medicine | Admitting: Internal Medicine

## 2021-06-11 ENCOUNTER — Other Ambulatory Visit: Payer: Self-pay

## 2021-06-11 ENCOUNTER — Inpatient Hospital Stay: Payer: Medicare HMO

## 2021-06-11 ENCOUNTER — Emergency Department: Payer: Medicare HMO

## 2021-06-11 ENCOUNTER — Inpatient Hospital Stay: Payer: Medicare HMO | Admitting: Anesthesiology

## 2021-06-11 ENCOUNTER — Encounter
Admission: EM | Disposition: A | Discharge status: Skilled Nursing Facility | Payer: Self-pay | Source: Home / Self Care | Attending: Internal Medicine

## 2021-06-11 ENCOUNTER — Encounter: Payer: Self-pay | Admitting: Emergency Medicine

## 2021-06-11 DIAGNOSIS — I2510 Atherosclerotic heart disease of native coronary artery without angina pectoris: Secondary | ICD-10-CM | POA: Diagnosis present

## 2021-06-11 DIAGNOSIS — D62 Acute posthemorrhagic anemia: Secondary | ICD-10-CM | POA: Diagnosis not present

## 2021-06-11 DIAGNOSIS — Z79899 Other long term (current) drug therapy: Secondary | ICD-10-CM

## 2021-06-11 DIAGNOSIS — S72002E Fracture of unspecified part of neck of left femur, subsequent encounter for open fracture type I or II with routine healing: Secondary | ICD-10-CM | POA: Diagnosis not present

## 2021-06-11 DIAGNOSIS — Z9049 Acquired absence of other specified parts of digestive tract: Secondary | ICD-10-CM

## 2021-06-11 DIAGNOSIS — S72002B Fracture of unspecified part of neck of left femur, initial encounter for open fracture type I or II: Secondary | ICD-10-CM

## 2021-06-11 DIAGNOSIS — G20 Parkinson's disease: Secondary | ICD-10-CM

## 2021-06-11 DIAGNOSIS — Y9301 Activity, walking, marching and hiking: Secondary | ICD-10-CM | POA: Diagnosis present

## 2021-06-11 DIAGNOSIS — Z882 Allergy status to sulfonamides status: Secondary | ICD-10-CM

## 2021-06-11 DIAGNOSIS — S72142A Displaced intertrochanteric fracture of left femur, initial encounter for closed fracture: Principal | ICD-10-CM | POA: Diagnosis present

## 2021-06-11 DIAGNOSIS — R296 Repeated falls: Secondary | ICD-10-CM | POA: Diagnosis present

## 2021-06-11 DIAGNOSIS — Z23 Encounter for immunization: Secondary | ICD-10-CM | POA: Diagnosis present

## 2021-06-11 DIAGNOSIS — Z20822 Contact with and (suspected) exposure to covid-19: Secondary | ICD-10-CM | POA: Diagnosis present

## 2021-06-11 DIAGNOSIS — Z888 Allergy status to other drugs, medicaments and biological substances status: Secondary | ICD-10-CM

## 2021-06-11 DIAGNOSIS — E785 Hyperlipidemia, unspecified: Secondary | ICD-10-CM | POA: Diagnosis not present

## 2021-06-11 DIAGNOSIS — Z9181 History of falling: Secondary | ICD-10-CM

## 2021-06-11 DIAGNOSIS — W010XXA Fall on same level from slipping, tripping and stumbling without subsequent striking against object, initial encounter: Secondary | ICD-10-CM | POA: Diagnosis present

## 2021-06-11 DIAGNOSIS — F0280 Dementia in other diseases classified elsewhere without behavioral disturbance: Secondary | ICD-10-CM | POA: Diagnosis present

## 2021-06-11 DIAGNOSIS — Y92009 Unspecified place in unspecified non-institutional (private) residence as the place of occurrence of the external cause: Secondary | ICD-10-CM

## 2021-06-11 DIAGNOSIS — S02401A Maxillary fracture, unspecified, initial encounter for closed fracture: Secondary | ICD-10-CM

## 2021-06-11 DIAGNOSIS — Z803 Family history of malignant neoplasm of breast: Secondary | ICD-10-CM

## 2021-06-11 DIAGNOSIS — Z7982 Long term (current) use of aspirin: Secondary | ICD-10-CM | POA: Diagnosis not present

## 2021-06-11 DIAGNOSIS — G4733 Obstructive sleep apnea (adult) (pediatric): Secondary | ICD-10-CM | POA: Diagnosis present

## 2021-06-11 DIAGNOSIS — M797 Fibromyalgia: Secondary | ICD-10-CM | POA: Diagnosis present

## 2021-06-11 DIAGNOSIS — Z833 Family history of diabetes mellitus: Secondary | ICD-10-CM

## 2021-06-11 DIAGNOSIS — Z885 Allergy status to narcotic agent status: Secondary | ICD-10-CM | POA: Diagnosis not present

## 2021-06-11 DIAGNOSIS — J449 Chronic obstructive pulmonary disease, unspecified: Secondary | ICD-10-CM | POA: Diagnosis present

## 2021-06-11 DIAGNOSIS — W19XXXA Unspecified fall, initial encounter: Secondary | ICD-10-CM

## 2021-06-11 DIAGNOSIS — G4730 Sleep apnea, unspecified: Secondary | ICD-10-CM | POA: Diagnosis present

## 2021-06-11 DIAGNOSIS — Z8249 Family history of ischemic heart disease and other diseases of the circulatory system: Secondary | ICD-10-CM | POA: Diagnosis not present

## 2021-06-11 DIAGNOSIS — Z9849 Cataract extraction status, unspecified eye: Secondary | ICD-10-CM

## 2021-06-11 DIAGNOSIS — S0240DA Maxillary fracture, left side, initial encounter for closed fracture: Secondary | ICD-10-CM | POA: Diagnosis present

## 2021-06-11 DIAGNOSIS — R4182 Altered mental status, unspecified: Secondary | ICD-10-CM | POA: Diagnosis not present

## 2021-06-11 DIAGNOSIS — T148XXA Other injury of unspecified body region, initial encounter: Secondary | ICD-10-CM

## 2021-06-11 DIAGNOSIS — I10 Essential (primary) hypertension: Secondary | ICD-10-CM | POA: Diagnosis not present

## 2021-06-11 DIAGNOSIS — G231 Progressive supranuclear ophthalmoplegia [Steele-Richardson-Olszewski]: Secondary | ICD-10-CM | POA: Diagnosis present

## 2021-06-11 DIAGNOSIS — S72009A Fracture of unspecified part of neck of unspecified femur, initial encounter for closed fracture: Secondary | ICD-10-CM | POA: Diagnosis present

## 2021-06-11 HISTORY — PX: INTRAMEDULLARY (IM) NAIL INTERTROCHANTERIC: SHX5875

## 2021-06-11 LAB — CBC WITH DIFFERENTIAL/PLATELET
Abs Immature Granulocytes: 0.05 10*3/uL (ref 0.00–0.07)
Basophils Absolute: 0.1 10*3/uL (ref 0.0–0.1)
Basophils Relative: 1 %
Eosinophils Absolute: 0.1 10*3/uL (ref 0.0–0.5)
Eosinophils Relative: 1 %
HCT: 33.9 % — ABNORMAL LOW (ref 36.0–46.0)
Hemoglobin: 11.4 g/dL — ABNORMAL LOW (ref 12.0–15.0)
Immature Granulocytes: 1 %
Lymphocytes Relative: 12 %
Lymphs Abs: 1.1 10*3/uL (ref 0.7–4.0)
MCH: 33.1 pg (ref 26.0–34.0)
MCHC: 33.6 g/dL (ref 30.0–36.0)
MCV: 98.5 fL (ref 80.0–100.0)
Monocytes Absolute: 0.7 10*3/uL (ref 0.1–1.0)
Monocytes Relative: 8 %
Neutro Abs: 7.6 10*3/uL (ref 1.7–7.7)
Neutrophils Relative %: 77 %
Platelets: 188 10*3/uL (ref 150–400)
RBC: 3.44 MIL/uL — ABNORMAL LOW (ref 3.87–5.11)
RDW: 12.7 % (ref 11.5–15.5)
WBC: 9.6 10*3/uL (ref 4.0–10.5)
nRBC: 0 % (ref 0.0–0.2)

## 2021-06-11 LAB — RESP PANEL BY RT-PCR (FLU A&B, COVID) ARPGX2
Influenza A by PCR: NEGATIVE
Influenza B by PCR: NEGATIVE
SARS Coronavirus 2 by RT PCR: NEGATIVE

## 2021-06-11 LAB — PROTIME-INR
INR: 1 (ref 0.8–1.2)
Prothrombin Time: 12.8 seconds (ref 11.4–15.2)

## 2021-06-11 LAB — TYPE AND SCREEN
ABO/RH(D): A NEG
Antibody Screen: NEGATIVE

## 2021-06-11 LAB — BASIC METABOLIC PANEL
Anion gap: 12 (ref 5–15)
BUN: 23 mg/dL (ref 8–23)
CO2: 24 mmol/L (ref 22–32)
Calcium: 8.7 mg/dL — ABNORMAL LOW (ref 8.9–10.3)
Chloride: 105 mmol/L (ref 98–111)
Creatinine, Ser: 0.63 mg/dL (ref 0.44–1.00)
GFR, Estimated: >60 mL/min (ref >60)
Glucose, Bld: 139 mg/dL — ABNORMAL HIGH (ref 70–99)
Potassium: 3.2 mmol/L — ABNORMAL LOW (ref 3.5–5.1)
Sodium: 141 mmol/L (ref 135–145)

## 2021-06-11 LAB — APTT: aPTT: 31 seconds (ref 24–36)

## 2021-06-11 SURGERY — FIXATION, FRACTURE, INTERTROCHANTERIC, WITH INTRAMEDULLARY ROD
Anesthesia: General | Laterality: Left

## 2021-06-11 MED ORDER — DEXAMETHASONE SODIUM PHOSPHATE 10 MG/ML IJ SOLN
INTRAMUSCULAR | Status: AC
  Filled 2021-06-11: qty 1

## 2021-06-11 MED ORDER — LIDOCAINE HCL (CARDIAC) PF 100 MG/5ML IV SOSY
PREFILLED_SYRINGE | INTRAVENOUS | Status: DC | PRN
  Administered 2021-06-11: 60 mg via INTRAVENOUS

## 2021-06-11 MED ORDER — HYDROCODONE-ACETAMINOPHEN 5-325 MG PO TABS
1.0000 | ORAL_TABLET | ORAL | Status: DC | PRN
  Administered 2021-06-12 (×2): 2 via ORAL
  Administered 2021-06-13: 1 via ORAL
  Filled 2021-06-11: qty 1
  Filled 2021-06-11 (×3): qty 2

## 2021-06-11 MED ORDER — 0.9 % SODIUM CHLORIDE (POUR BTL) OPTIME
TOPICAL | Status: DC | PRN
  Administered 2021-06-11: 400 mL

## 2021-06-11 MED ORDER — ROCURONIUM BROMIDE 10 MG/ML (PF) SYRINGE
PREFILLED_SYRINGE | INTRAVENOUS | Status: AC
  Filled 2021-06-11: qty 10

## 2021-06-11 MED ORDER — FLEET ENEMA 7-19 GM/118ML RE ENEM: 1.0000 | ENEMA | Freq: Once | RECTAL | Status: DC | PRN

## 2021-06-11 MED ORDER — BISACODYL 10 MG RE SUPP
10.0000 mg | Freq: Every day | RECTAL | Status: DC | PRN
  Filled 2021-06-11 (×2): qty 1

## 2021-06-11 MED ORDER — PANTOPRAZOLE SODIUM 40 MG PO TBEC
40.0000 mg | DELAYED_RELEASE_TABLET | Freq: Every day | ORAL | Status: DC
  Administered 2021-06-12 – 2021-06-15 (×4): 40 mg via ORAL
  Filled 2021-06-11 (×4): qty 1

## 2021-06-11 MED ORDER — ROCURONIUM BROMIDE 100 MG/10ML IV SOLN
INTRAVENOUS | Status: DC | PRN
  Administered 2021-06-11: 50 mg via INTRAVENOUS
  Administered 2021-06-11: 10 mg via INTRAVENOUS

## 2021-06-11 MED ORDER — METOCLOPRAMIDE HCL 5 MG/ML IJ SOLN: 5.0000 mg | Freq: Three times a day (TID) | INTRAMUSCULAR | Status: DC | PRN

## 2021-06-11 MED ORDER — DOCUSATE SODIUM 100 MG PO CAPS
100.0000 mg | ORAL_CAPSULE | Freq: Two times a day (BID) | ORAL | Status: DC
  Administered 2021-06-11 – 2021-06-15 (×8): 100 mg via ORAL
  Filled 2021-06-11 (×9): qty 1

## 2021-06-11 MED ORDER — EPHEDRINE 5 MG/ML INJ
INTRAVENOUS | Status: AC
  Filled 2021-06-11: qty 5

## 2021-06-11 MED ORDER — SUCCINYLCHOLINE CHLORIDE 200 MG/10ML IV SOSY
PREFILLED_SYRINGE | INTRAVENOUS | Status: AC
  Filled 2021-06-11: qty 10

## 2021-06-11 MED ORDER — ENOXAPARIN SODIUM 40 MG/0.4ML IJ SOSY
40.0000 mg | PREFILLED_SYRINGE | INTRAMUSCULAR | Status: DC
  Administered 2021-06-12 – 2021-06-15 (×4): 40 mg via SUBCUTANEOUS
  Filled 2021-06-11 (×4): qty 0.4

## 2021-06-11 MED ORDER — ACETAMINOPHEN 10 MG/ML IV SOLN: 1000.0000 mg | Freq: Once | INTRAVENOUS | Status: DC | PRN

## 2021-06-11 MED ORDER — EPHEDRINE SULFATE 50 MG/ML IJ SOLN
INTRAMUSCULAR | Status: DC | PRN
  Administered 2021-06-11 (×3): 10 mg via INTRAVENOUS

## 2021-06-11 MED ORDER — PHENYLEPHRINE HCL (PRESSORS) 10 MG/ML IV SOLN
INTRAVENOUS | Status: DC | PRN
  Administered 2021-06-11 (×2): 80 ug via INTRAVENOUS
  Administered 2021-06-11: 50 ug via INTRAVENOUS

## 2021-06-11 MED ORDER — KETOROLAC TROMETHAMINE 15 MG/ML IJ SOLN
7.5000 mg | Freq: Four times a day (QID) | INTRAMUSCULAR | Status: AC
  Administered 2021-06-11 – 2021-06-12 (×4): 7.5 mg via INTRAVENOUS
  Filled 2021-06-11 (×4): qty 1

## 2021-06-11 MED ORDER — ONDANSETRON HCL 4 MG/2ML IJ SOLN: 4.0000 mg | Freq: Four times a day (QID) | INTRAMUSCULAR | Status: DC | PRN

## 2021-06-11 MED ORDER — FENTANYL CITRATE (PF) 100 MCG/2ML IJ SOLN
INTRAMUSCULAR | Status: AC
  Filled 2021-06-11: qty 2

## 2021-06-11 MED ORDER — PROPOFOL 10 MG/ML IV BOLUS
INTRAVENOUS | Status: DC | PRN
  Administered 2021-06-11: 120 mg via INTRAVENOUS

## 2021-06-11 MED ORDER — SUGAMMADEX SODIUM 200 MG/2ML IV SOLN
INTRAVENOUS | Status: DC | PRN
  Administered 2021-06-11: 200 mg via INTRAVENOUS

## 2021-06-11 MED ORDER — ATORVASTATIN CALCIUM 20 MG PO TABS
40.0000 mg | ORAL_TABLET | Freq: Every evening | ORAL | Status: DC
  Administered 2021-06-11 – 2021-06-14 (×4): 40 mg via ORAL
  Filled 2021-06-11 (×4): qty 2

## 2021-06-11 MED ORDER — MORPHINE SULFATE (PF) 2 MG/ML IV SOLN
1.0000 mg | INTRAVENOUS | Status: DC | PRN
  Administered 2021-06-11: 2 mg via INTRAVENOUS
  Filled 2021-06-11: qty 1

## 2021-06-11 MED ORDER — KETOROLAC TROMETHAMINE 15 MG/ML IJ SOLN
INTRAMUSCULAR | Status: AC
  Administered 2021-06-11: 15 mg via INTRAVENOUS
  Filled 2021-06-11: qty 1

## 2021-06-11 MED ORDER — ACETAMINOPHEN 10 MG/ML IV SOLN
INTRAVENOUS | Status: DC | PRN
  Administered 2021-06-11: 1000 mg via INTRAVENOUS

## 2021-06-11 MED ORDER — SUCCINYLCHOLINE CHLORIDE 200 MG/10ML IV SOSY
PREFILLED_SYRINGE | INTRAVENOUS | Status: DC | PRN
  Administered 2021-06-11: 100 mg via INTRAVENOUS

## 2021-06-11 MED ORDER — BUPIVACAINE LIPOSOME 1.3 % IJ SUSP
INTRAMUSCULAR | Status: AC
  Filled 2021-06-11: qty 20

## 2021-06-11 MED ORDER — KETOROLAC TROMETHAMINE 15 MG/ML IJ SOLN: 15.0000 mg | Freq: Once | INTRAMUSCULAR | Status: AC

## 2021-06-11 MED ORDER — ACETAMINOPHEN 500 MG PO TABS
500.0000 mg | ORAL_TABLET | Freq: Four times a day (QID) | ORAL | Status: AC
  Administered 2021-06-11 – 2021-06-12 (×4): 500 mg via ORAL
  Filled 2021-06-11 (×4): qty 1

## 2021-06-11 MED ORDER — TRAMADOL HCL 50 MG PO TABS
50.0000 mg | ORAL_TABLET | Freq: Four times a day (QID) | ORAL | Status: DC
  Administered 2021-06-11 – 2021-06-15 (×16): 50 mg via ORAL
  Filled 2021-06-11 (×16): qty 1

## 2021-06-11 MED ORDER — ACETAMINOPHEN 10 MG/ML IV SOLN
INTRAVENOUS | Status: AC
  Filled 2021-06-11: qty 100

## 2021-06-11 MED ORDER — FENTANYL CITRATE (PF) 100 MCG/2ML IJ SOLN
25.0000 ug | INTRAMUSCULAR | Status: DC | PRN
  Administered 2021-06-11 (×3): 25 ug via INTRAVENOUS

## 2021-06-11 MED ORDER — ONDANSETRON HCL 4 MG/2ML IJ SOLN: 4.0000 mg | Freq: Once | INTRAMUSCULAR | Status: DC | PRN

## 2021-06-11 MED ORDER — ONDANSETRON HCL 4 MG/2ML IJ SOLN
INTRAMUSCULAR | Status: AC
  Filled 2021-06-11: qty 2

## 2021-06-11 MED ORDER — BUPIVACAINE-EPINEPHRINE (PF) 0.5% -1:200000 IJ SOLN
INTRAMUSCULAR | Status: DC | PRN
  Administered 2021-06-11: 50 mL

## 2021-06-11 MED ORDER — SENNOSIDES-DOCUSATE SODIUM 8.6-50 MG PO TABS: 1.0000 | ORAL_TABLET | Freq: Every evening | ORAL | Status: DC | PRN

## 2021-06-11 MED ORDER — POTASSIUM CHLORIDE IN NACL 20-0.9 MEQ/L-% IV SOLN
INTRAVENOUS | Status: DC
  Filled 2021-06-11 (×5): qty 1000

## 2021-06-11 MED ORDER — ONDANSETRON HCL 4 MG/2ML IJ SOLN
INTRAMUSCULAR | Status: DC | PRN
  Administered 2021-06-11: 4 mg via INTRAVENOUS

## 2021-06-11 MED ORDER — DIPHENHYDRAMINE HCL 12.5 MG/5ML PO ELIX
12.5000 mg | ORAL_SOLUTION | ORAL | Status: DC | PRN
  Filled 2021-06-11: qty 10

## 2021-06-11 MED ORDER — DEXAMETHASONE SODIUM PHOSPHATE 10 MG/ML IJ SOLN
INTRAMUSCULAR | Status: DC | PRN
  Administered 2021-06-11: 5 mg via INTRAVENOUS

## 2021-06-11 MED ORDER — FENTANYL CITRATE (PF) 100 MCG/2ML IJ SOLN
INTRAMUSCULAR | Status: AC
  Administered 2021-06-11: 25 ug via INTRAVENOUS
  Filled 2021-06-11: qty 2

## 2021-06-11 MED ORDER — MAGNESIUM HYDROXIDE 400 MG/5ML PO SUSP: 30.0000 mL | Freq: Every day | ORAL | Status: DC | PRN

## 2021-06-11 MED ORDER — ONDANSETRON HCL 4 MG PO TABS: 4.0000 mg | ORAL_TABLET | Freq: Four times a day (QID) | ORAL | Status: DC | PRN

## 2021-06-11 MED ORDER — BISACODYL 5 MG PO TBEC
5.0000 mg | DELAYED_RELEASE_TABLET | Freq: Every day | ORAL | Status: DC | PRN
  Administered 2021-06-14: 5 mg via ORAL
  Filled 2021-06-11: qty 1

## 2021-06-11 MED ORDER — PROPOFOL 10 MG/ML IV BOLUS
INTRAVENOUS | Status: AC
  Filled 2021-06-11: qty 20

## 2021-06-11 MED ORDER — FENTANYL CITRATE (PF) 100 MCG/2ML IJ SOLN
50.0000 ug | Freq: Once | INTRAMUSCULAR | Status: AC
  Administered 2021-06-11: 50 ug via INTRAVENOUS
  Filled 2021-06-11: qty 2

## 2021-06-11 MED ORDER — ACETAMINOPHEN 325 MG PO TABS
325.0000 mg | ORAL_TABLET | Freq: Four times a day (QID) | ORAL | Status: DC | PRN
  Administered 2021-06-13: 650 mg via ORAL
  Filled 2021-06-11: qty 2

## 2021-06-11 MED ORDER — PNEUMOCOCCAL VAC POLYVALENT 25 MCG/0.5ML IJ INJ
0.5000 mL | INJECTION | INTRAMUSCULAR | Status: AC
  Administered 2021-06-12: 0.5 mL via INTRAMUSCULAR
  Filled 2021-06-11: qty 0.5

## 2021-06-11 MED ORDER — CEFAZOLIN SODIUM-DEXTROSE 2-4 GM/100ML-% IV SOLN
2.0000 g | Freq: Four times a day (QID) | INTRAVENOUS | Status: AC
  Administered 2021-06-11 – 2021-06-12 (×2): 2 g via INTRAVENOUS
  Filled 2021-06-11 (×3): qty 100

## 2021-06-11 MED ORDER — METOCLOPRAMIDE HCL 10 MG PO TABS: 5.0000 mg | ORAL_TABLET | Freq: Three times a day (TID) | ORAL | Status: DC | PRN

## 2021-06-11 MED ORDER — LACTATED RINGERS IV SOLN: INTRAVENOUS | Status: DC | PRN

## 2021-06-11 MED ORDER — FENTANYL CITRATE (PF) 100 MCG/2ML IJ SOLN
INTRAMUSCULAR | Status: DC | PRN
  Administered 2021-06-11 (×4): 50 ug via INTRAVENOUS

## 2021-06-11 MED ORDER — CEFAZOLIN SODIUM-DEXTROSE 2-4 GM/100ML-% IV SOLN
2.0000 g | INTRAVENOUS | Status: AC
  Administered 2021-06-11: 2 g via INTRAVENOUS
  Filled 2021-06-11: qty 100

## 2021-06-11 MED ORDER — CEFAZOLIN SODIUM-DEXTROSE 2-4 GM/100ML-% IV SOLN
INTRAVENOUS | Status: AC
  Administered 2021-06-11: 2 g via INTRAVENOUS
  Filled 2021-06-11: qty 100

## 2021-06-11 SURGICAL SUPPLY — 42 items
BIT DRILL 4.3MMS DISTAL GRDTED (BIT) ×1 IMPLANT
BNDG COHESIVE 4X5 TAN ST LF (GAUZE/BANDAGES/DRESSINGS) ×2 IMPLANT
BNDG COHESIVE 6X5 TAN ST LF (GAUZE/BANDAGES/DRESSINGS) ×2 IMPLANT
CHLORAPREP W/TINT 26 (MISCELLANEOUS) ×4 IMPLANT
DRAPE 3/4 80X56 (DRAPES) ×2 IMPLANT
DRILL 4.3MMS DISTAL GRADUATED (BIT) ×2
DRSG MEPILEX SACRM 8.7X9.8 (GAUZE/BANDAGES/DRESSINGS) ×2 IMPLANT
DRSG OPSITE POSTOP 3X4 (GAUZE/BANDAGES/DRESSINGS) ×4 IMPLANT
DRSG OPSITE POSTOP 4X6 (GAUZE/BANDAGES/DRESSINGS) ×2 IMPLANT
ELECT CAUTERY BLADE 6.4 (BLADE) ×2 IMPLANT
ELECT REM PT RETURN 9FT ADLT (ELECTROSURGICAL) ×2
ELECTRODE REM PT RTRN 9FT ADLT (ELECTROSURGICAL) ×1 IMPLANT
GAUZE SPONGE 4X4 12PLY STRL (GAUZE/BANDAGES/DRESSINGS) ×2 IMPLANT
GAUZE XEROFORM 1X8 LF (GAUZE/BANDAGES/DRESSINGS) ×2 IMPLANT
GLOVE SURG ENC MOIS LTX SZ8 (GLOVE) ×4 IMPLANT
GLOVE SURG UNDER LTX SZ8 (GLOVE) ×2 IMPLANT
GOWN STRL REUS W/ TWL LRG LVL3 (GOWN DISPOSABLE) ×1 IMPLANT
GOWN STRL REUS W/ TWL XL LVL3 (GOWN DISPOSABLE) ×1 IMPLANT
GOWN STRL REUS W/TWL LRG LVL3 (GOWN DISPOSABLE) ×1
GOWN STRL REUS W/TWL XL LVL3 (GOWN DISPOSABLE) ×1
GUIDEPIN VERSANAIL DSP 3.2X444 (ORTHOPEDIC DISPOSABLE SUPPLIES) ×2 IMPLANT
GUIDEWIRE BALL NOSE 80CM (WIRE) ×2 IMPLANT
HFN LAG SCREW 10.5MM X 85MM (Screw) ×2 IMPLANT
HIP FRA NAIL LAG SCREW 10.5X90 (Orthopedic Implant) ×2 IMPLANT
MANIFOLD NEPTUNE II (INSTRUMENTS) ×2 IMPLANT
MAT ABSORB  FLUID 56X50 GRAY (MISCELLANEOUS) ×1
MAT ABSORB FLUID 56X50 GRAY (MISCELLANEOUS) ×1 IMPLANT
NAIL HIP FRA AFFIX 130X9X340 L (Nail) ×2 IMPLANT
NEEDLE HYPO 22GX1.5 SAFETY (NEEDLE) ×2 IMPLANT
NS IRRIG 500ML POUR BTL (IV SOLUTION) ×2 IMPLANT
PACK HIP COMPR (MISCELLANEOUS) ×2 IMPLANT
SCREW BONE CORTICAL 5.0X36 (Screw) ×2 IMPLANT
SCREW LAG HIP FRA NAIL 10.5X90 (Orthopedic Implant) ×1 IMPLANT
SPONGE T-LAP 18X18 ~~LOC~~+RFID (SPONGE) ×2 IMPLANT
STAPLER SKIN PROX 35W (STAPLE) ×2 IMPLANT
STRAP SAFETY 5IN WIDE (MISCELLANEOUS) ×2 IMPLANT
SUT VIC AB 0 CT1 36 (SUTURE) ×2 IMPLANT
SUT VIC AB 1 CT1 36 (SUTURE) ×2 IMPLANT
SUT VIC AB 2-0 CT1 (SUTURE) ×4 IMPLANT
SYR 10ML LL (SYRINGE) ×2 IMPLANT
SYR 30ML LL (SYRINGE) ×2 IMPLANT
TAPE MICROFOAM 4IN (TAPE) ×2 IMPLANT

## 2021-06-11 NOTE — ED Notes (Signed)
Patient transported to CT 

## 2021-06-11 NOTE — ED Notes (Signed)
RN requested by OR to have consent signed- patient and husband have not spoken with surgeon- consent filled out however not signed at this time.

## 2021-06-11 NOTE — Consult Note (Addendum)
ORTHOPAEDIC CONSULTATION  REQUESTING PHYSICIAN: Lynn Ito, MD  Chief Complaint:   Left hip pain.  History of Present Illness: Tina Bowman is a 75 y.o. female with multiple medical problems including Parkinson's, early dementia, sleep apnea, hypertension, fibromyalgia, COPD/asthma, and coronary artery disease who lives independently with her husband ambulates with a walker for balance and support.  The patient was in her usual state of health until early this morning when she apparently lost her balance and fell onto her left hip.  She was brought to the emergency room where x-rays demonstrated a displaced reverse obliquity intertrochanteric fracture of the left hip.  The patient did strike her head but did not lose consciousness.  Her head has been cleared by CT scan in the emergency room.  She denies any lightheadedness, dizziness, chest pain, shortness of breath, or other symptoms which may have precipitated her fall.  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   Sleep apnea    Slurred speech    foggy minded   Past Surgical History:  Procedure Laterality Date   ABDOMINAL HYSTERECTOMY     APPENDECTOMY     BACK SURGERY     BREAST EXCISIONAL BIOPSY Left    2000's   BREAST SURGERY     CATARACT EXTRACTION     Also, retina surgery   CHOLECYSTECTOMY     COLONOSCOPY N/A 05/11/2021   Procedure: COLONOSCOPY;  Surgeon: Toledo, Boykin Nearing, MD;  Location: [facility] ENDOSCOPY;  Service: Gastroenterology;  Laterality: N/A;  PARKINSON'S   EYE SURGERY     FRACTURE SURGERY     NARCOTIC PAIN PUMP IMPLANT      SHOULDER SURGERY     TONSILLECTOMY     TUBAL LIGATION     Social History   Socioeconomic History   Marital status: Married    Spouse name: Not on file   Number of children: 3   Years of education: HS   Highest education level: Not on file  Occupational History   Occupation: Retired  Tobacco Use   Smoking status: Never   Smokeless tobacco: Never  Vaping Use   Vaping Use: Never used  Substance and Sexual Activity   Alcohol use: Not Currently    Comment: social   Drug use: No   Sexual activity: Not on file  Other Topics Concern   Not on file  Social History Narrative   Lives at home with her husband.   Right-handed.   No caffeine use.   Social Determinants of Health   Financial Resource Strain: Not on file  Food Insecurity: Not on file  Transportation Needs: Not on file  Physical Activity: Not on file  Stress: Not on file  Social Connections: Not on file   Family History  Problem Relation Age of Onset   Heart attack Mother    Heart disease Father    Breast cancer Cousin    Breast cancer Maternal Aunt    Diabetes Maternal Grandfather    Allergies  Allergen Reactions   Methadone Other (See Comments)    Pt doesn't know   Niacin And Related Other (See Comments)  Gives her hot flashes   Oxycontin [Oxycodone] Other (See Comments)    Makes her feel strange.   Sulfa Antibiotics Other (See Comments)    Pt doesn't know   Other Rash    Band aids (different brands) causes pt to break out in a rash.    Prior to Admission medications   Medication Sig Start Date End Date Taking? Authorizing Provider  albuterol (PROVENTIL HFA;VENTOLIN HFA) 108 (90 Base) MCG/ACT inhaler Inhale 2 puffs into the lungs every 6 (six) hours as needed for wheezing or shortness of breath.    [provider]  aspirin 325 MG tablet Take 325 mg by mouth daily.    [provider]  atorvastatin (LIPITOR) 40 MG tablet Take 40 mg by mouth every evening.    [provider]   calcium-vitamin D (OSCAL WITH D) 500-200 MG-UNIT per tablet Take 1 tablet by mouth.    [provider]  enalapril (VASOTEC) 20 MG tablet Take 20 mg by mouth daily.    [provider]  hydrALAZINE (APRESOLINE) 25 MG tablet Take 25 mg by mouth 2 (two) times daily.    [provider]  HYDROcodone-acetaminophen (NORCO/VICODIN) 5-325 MG tablet Take 1 tablet by mouth every 4 (four) hours as needed for moderate pain. 06/19/18   Cuthriell, Delorise Royals, PA-C  levalbuterol (XOPENEX) 1.25 MG/3ML nebulizer solution Take 1.25 mg by nebulization every 4 (four) hours as needed for wheezing or shortness of breath.    [provider]  loratadine (CLARITIN) 10 MG tablet Take 10 mg by mouth daily.    [provider]  metoprolol (LOPRESSOR) 50 MG tablet Take 50 mg by mouth 2 (two) times daily.    [provider]  montelukast (SINGULAIR) 10 MG tablet Take 10 mg by mouth at bedtime.    [provider]  omeprazole (PRILOSEC) 40 MG capsule Take 40 mg by mouth daily.    [provider]  ranitidine (ZANTAC) 150 MG tablet Take 150 mg by mouth at bedtime.     [provider]  traMADol (ULTRAM) 50 MG tablet Take by mouth every 6 (six) hours as needed.    [provider]  umeclidinium bromide (INCRUSE ELLIPTA) 62.5 MCG/INH AEPB Inhale 1 puff into the lungs daily.    [provider]   CT HEAD WO CONTRAST ( )  Result Date: 06/11/2021 CLINICAL DATA:  Unwitnessed fall while going to the bathroom. EXAM: CT HEAD WITHOUT CONTRAST CT CERVICAL SPINE WITHOUT CONTRAST TECHNIQUE: Multidetector CT imaging of the head and cervical spine was performed following the standard protocol without intravenous contrast. Multiplanar CT image reconstructions of the cervical spine were also generated. COMPARISON:  None. FINDINGS: CT HEAD FINDINGS Brain: No evidence of acute infarction, hemorrhage, hydrocephalus, extra-axial collection or mass lesion/mass  effect. Vascular: There is minimal atherosclerotic calcification of the internal carotid arteries. No hyperdense vessels. Skull: Normal. Negative for fracture or focal lesion. Sinuses/Orbits: Ethmoid air cells are normal. Partially imaged LEFT maxillary sinus air-fluid level. Mastoid air cells are normal. Other: None CT CERVICAL SPINE FINDINGS Alignment: There is reversal of the cervical lordosis in the mid cervical levels, related to degenerative changes. There is 2 millimeters anterolisthesis of C5 on C6. Skull base and vertebrae: No acute fracture. No primary bone lesion or focal pathologic process. Soft tissues and spinal canal: No prevertebral fluid or swelling. No visible canal hematoma. Disc levels: Significant disc height loss and uncovertebral spurring at C5-6, C6-7, and C7-T1. Upper chest: Negative. Other: Note is made of  air-fluid level within the LEFT maxillary sinus, associated with a minimally displaced fracture of the LATERAL wall. Remote ORIF of the RIGHT clavicle. IMPRESSION: 1.  No evidence for acute intracranial abnormality. 2. Moderate mid cervical degenerative changes without acute fracture. 3. Fracture of the LATERAL wall of the LEFT maxillary sinus, associated with air-fluid level sinus. Electronically Signed   By: Norva Pavlov M.D.   On: 06/11/2021 10:07   CT Cervical Spine Wo Contrast  Result Date: 06/11/2021 CLINICAL DATA:  Unwitnessed fall while going to the bathroom. EXAM: CT HEAD WITHOUT CONTRAST CT CERVICAL SPINE WITHOUT CONTRAST TECHNIQUE: Multidetector CT imaging of the head and cervical spine was performed following the standard protocol without intravenous contrast. Multiplanar CT image reconstructions of the cervical spine were also generated. COMPARISON:  None. FINDINGS: CT HEAD FINDINGS Brain: No evidence of acute infarction, hemorrhage, hydrocephalus, extra-axial collection or mass lesion/mass effect. Vascular: There is minimal atherosclerotic calcification of the  internal carotid arteries. No hyperdense vessels. Skull: Normal. Negative for fracture or focal lesion. Sinuses/Orbits: Ethmoid air cells are normal. Partially imaged LEFT maxillary sinus air-fluid level. Mastoid air cells are normal. Other: None CT CERVICAL SPINE FINDINGS Alignment: There is reversal of the cervical lordosis in the mid cervical levels, related to degenerative changes. There is 2 millimeters anterolisthesis of C5 on C6. Skull base and vertebrae: No acute fracture. No primary bone lesion or focal pathologic process. Soft tissues and spinal canal: No prevertebral fluid or swelling. No visible canal hematoma. Disc levels: Significant disc height loss and uncovertebral spurring at C5-6, C6-7, and C7-T1. Upper chest: Negative. Other: Note is made of air-fluid level within the LEFT maxillary sinus, associated with a minimally displaced fracture of the LATERAL wall. Remote ORIF of the RIGHT clavicle. IMPRESSION: 1.  No evidence for acute intracranial abnormality. 2. Moderate mid cervical degenerative changes without acute fracture. 3. Fracture of the LATERAL wall of the LEFT maxillary sinus, associated with air-fluid level sinus. Electronically Signed   By: Norva Pavlov M.D.   On: 06/11/2021 10:07   DG Chest Portable 1 View  Result Date: 06/11/2021 CLINICAL DATA:  Preoperative examination. EXAM: PORTABLE CHEST 1 VIEW COMPARISON:  09/12/2017 FINDINGS: Grossly unchanged cardiac silhouette given decreased lung volumes and supine technique. Atherosclerotic plaque when the thoracic aorta. Lungs appear hyperexpanded with mild diffuse slightly nodular thickening the pulmonary interstitium. No discrete focal airspace opacities. No pleural effusion or pneumothorax. No evidence of edema. No definite acute osseous abnormalities. Spinal stimulator leads overlie the midthoracic spine. Post ORIF of the right clavicle, incompletely imaged. IMPRESSION: No acute cardiopulmonary disease on this AP supine portable  examination. Electronically Signed   By: Simonne Come M.D.   On: 06/11/2021 09:40   DG Hip Unilat W or Wo Pelvis 2-3 Views Left  Result Date: 06/11/2021 CLINICAL DATA:  Unwitnessed fall in the bathroom. EXAM: DG HIP (WITH OR WITHOUT PELVIS) 2-3V LEFT COMPARISON:  None. FINDINGS: There is an acute, horizontal, displaced fracture involving the intertrochanteric region of the femur with foreshortening and anteromedial displacement of the distal fracture fragment. No definitive intra-articular extension. Expected adjacent soft tissue swelling. No radiopaque foreign body No additional fractures identified. Spinal stimulator device overlies the medial aspect the left buttocks. IMPRESSION: Acute, horizontal, displaced left intertrochanteric femur fracture with foreshortening and displacement. Electronically Signed   By: Simonne Come M.D.   On: 06/11/2021 09:38    Positive ROS: All other systems have been reviewed and were otherwise negative with the exception of those mentioned in the HPI  and as above.  Physical Exam: General:  Alert, no acute distress Psychiatric:  Patient is competent for consent with normal mood and affect   Cardiovascular:  No pedal edema Respiratory:  No wheezing, non-labored breathing GI:  Abdomen is soft and non-tender Skin:  No lesions in the area of chief complaint Neurologic:  Sensation intact distally Lymphatic:  No axillary or cervical lymphadenopathy  Orthopedic Exam:  Examination is limited to the left hip and lower extremity.  The left lower extremity is held in a flexed and abducted position.  Skin inspection around the left hip is unremarkable.  No swelling, erythema, ecchymosis, abrasions, or other skin abnormalities are identified.  She has mild-moderate pain to palpation around the hip.  She has more severe pain with any attempted active or passive motion of the hip.  She is neurovascularly intact to the left lower extremity and foot.  X-rays:  X-rays of the pelvis  and left hip are available for review.  These films demonstrate a displaced reverse obliquity intertrochanteric fracture of the left hip.  No lytic lesions or significant degenerative changes of the left hip are noted.    Assessment: Displaced intertrochanteric fracture left hip.  Plan: The treatment options, including both surgical and nonsurgical choices, have been discussed in detail with the patient and her husband. The patient and her husband would like to proceed with surgical intervention to include an intramedullary nailing of the displaced intertrochanteric left hip fracture. The risks (including bleeding, infection, nerve and/or blood vessel injury, persistent or recurrent pain, loosening or failure of the components, malunion and/or nonunion, leg length inequality, need for further surgery, blood clots, strokes, heart attacks or arrhythmias, pneumonia, etc.) and benefits of the surgical procedure were discussed. The patient and her husband state their understanding and agree to proceed.  She agrees to a blood transfusion if necessary. A formal written consent has been obtained from her husband who is her power of attorney.  Thank you for asking to participate in the care of this unfortunate woman.  I will be happy to follow her with you.   Maryagnes AmosJ. Jeffrey Iyanna Drummer, MD  Beeper #:  [phone]  06/11/2021 11:30 AM

## 2021-06-11 NOTE — H&P (Signed)
History and Physical    Tina Bowman Tina Bowman:[id number] DOB: 07/16/1946 DOA: 06/11/2021  PCP: Barbette Reichmann, MD  Patient coming from: Home   Chief Complaint: Status post fall with hip pain  HPI: Tina Bowman is a 75 y.o. female with history of Parkinson's disease, asthma, hypertension, HCC, slurred speech and status multiple falls presents complaining of hip pain.  Patient went to the bathroom and as she was getting up she stated her underwear got caught on her feet and she had a mechanical fall.  Apparently was witnessed by husband.  She normally uses her rolling walker.  She was complaining of left hip pain.  EMS was called and patient was brought in.  Other than today she had fallen 10 days ago and 3 weeks ago per husband.  Per husband history of multiple falls due to her Parkinson's.   ED course: Blood pressure 139/74, afebrile, satting 98% on room air, heart rate 93 Hip x-ray:Acute, horizontal, displaced left intertrochanteric femur fracture with foreshortening and displacement.  Chest x-ray personally reviewed no acute disease.  COVID/respiratory panel negative.  Potassium 3.2 today.  Hematocrit 11.4 hemoglobin 33.9.  Otherwise unremarkable labs  CTS cervical spine:1.  No evidence for acute intracranial abnormality. 2. Moderate mid cervical degenerative changes without acute fracture. 3. Fracture of the LATERAL wall of the LEFT maxillary sinus, associated with air-fluid level sinus   CT of the head:1.  No evidence for acute intracranial abnormality. 2. Moderate mid cervical degenerative changes without acute fracture. 3. Fracture of the LATERAL wall of the LEFT maxillary sinus, associated with air-fluid level sinus.   Orthopedics was consulted and plan was to take patient to the OR today.  Review of Systems: All systems reviewed and otherwise negative.    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   Sleep apnea    Slurred speech    foggy minded    Past Surgical History:  Procedure Laterality Date   ABDOMINAL HYSTERECTOMY     APPENDECTOMY     BACK SURGERY     BREAST EXCISIONAL BIOPSY Left    2000's   BREAST SURGERY     CATARACT EXTRACTION     Also, retina surgery   CHOLECYSTECTOMY     COLONOSCOPY N/A 05/11/2021   Procedure: COLONOSCOPY;  Surgeon: Toledo, Boykin Nearing, MD;  Location: [facility] ENDOSCOPY;  Service: Gastroenterology;  Laterality: N/A;  PARKINSON'S   EYE SURGERY     FRACTURE SURGERY     NARCOTIC PAIN PUMP IMPLANT     SHOULDER SURGERY     TONSILLECTOMY     TUBAL LIGATION       reports that she has never smoked. She has never used smokeless tobacco. She reports that she does not currently use alcohol. She reports that she does not use drugs.  Allergies  Allergen Reactions   Methadone Other (See Comments)    Pt doesn't know   Niacin And Related Other (See Comments)    Gives her hot flashes   Oxycontin [Oxycodone] Other (See Comments)    Makes her feel strange.   Sulfa Antibiotics Other (See Comments)  Pt doesn't know   Other Rash    Band aids (different brands) causes pt to break out in a rash.     Family History  Problem Relation Age of Onset   Heart attack Mother    Heart disease Father    Breast cancer Cousin    Breast cancer Maternal Aunt    Diabetes Maternal Grandfather      Prior to Admission medications   Medication Sig Start Date End Date Taking? Authorizing Provider  albuterol (PROVENTIL HFA;VENTOLIN HFA) 108 (90 Base) MCG/ACT inhaler Inhale 2 puffs into the lungs every 6 (six) hours as needed for wheezing or shortness of breath.    [provider]  aspirin 325 MG tablet  Take 325 mg by mouth daily.    [provider]  atorvastatin (LIPITOR) 40 MG tablet Take 40 mg by mouth every evening.    [provider]  calcium-vitamin D (OSCAL WITH D) 500-200 MG-UNIT per tablet Take 1 tablet by mouth.    [provider]  enalapril (VASOTEC) 20 MG tablet Take 20 mg by mouth daily.    [provider]  hydrALAZINE (APRESOLINE) 25 MG tablet Take 25 mg by mouth 2 (two) times daily.    [provider]  HYDROcodone-acetaminophen (NORCO/VICODIN) 5-325 MG tablet Take 1 tablet by mouth every 4 (four) hours as needed for moderate pain. 06/19/18   Cuthriell, Delorise Royals, PA-C  levalbuterol (XOPENEX) 1.25 MG/3ML nebulizer solution Take 1.25 mg by nebulization every 4 (four) hours as needed for wheezing or shortness of breath.    [provider]  loratadine (CLARITIN) 10 MG tablet Take 10 mg by mouth daily.    [provider]  metoprolol (LOPRESSOR) 50 MG tablet Take 50 mg by mouth 2 (two) times daily.    [provider]  montelukast (SINGULAIR) 10 MG tablet Take 10 mg by mouth at bedtime.    [provider]  omeprazole (PRILOSEC) 40 MG capsule Take 40 mg by mouth daily.    [provider]  ranitidine (ZANTAC) 150 MG tablet Take 150 mg by mouth at bedtime.     [provider]  traMADol (ULTRAM) 50 MG tablet Take by mouth every 6 (six) hours as needed.    [provider]  umeclidinium bromide (INCRUSE ELLIPTA) 62.5 MCG/INH AEPB Inhale 1 puff into the lungs daily.    [provider]    Physical Exam: Vitals:   06/11/21 0843 06/11/21 0846 06/11/21 0900 06/11/21 0930  BP:  (!) 151/62 139/74 (!) 150/67  Pulse:  92 94 80  Resp:  17 14 14   Temp:      TempSrc:      SpO2:  98% 98% 98%  Weight: 70.3 kg     Height: 5\' 2"  (1.575 m)       Constitutional: NAD, calm, comfortable Vitals:   06/11/21 0843 06/11/21 0846 06/11/21 0900 06/11/21 0930  BP:  (!) 151/62 139/74 (!) 150/67   Pulse:  92 94 80  Resp:  17 14 14   Temp:      TempSrc:      SpO2:  98% 98% 98%  Weight: 70.3 kg     Height: 5\' 2"  (1.575 m)      Eyes: PERRL, lids and conjunctivae normal ENMT: Wearing mask Neck: normal, supple, no masses, no thyromegaly Respiratory: clear to auscultation bilaterally, no wheezing, no crackles. Normal respiratory effort. No accessory muscle use.  Cardiovascular: Regular rate and rhythm, n no gallops 2+ pedal pulses. No  carotid bruits.  No edema Abdomen: no tenderness, no masses palpated. No hepatosplenomegaly. Bowel sounds positive. extremities. Good ROM, no contractures. Normal muscle tone.  Skin: Warm dry Neurologic: CN 2-12 grossly intact.  Slurred speech difficult to understand Per husband this is baseline.  psychiatric: Normal judgment and insight. Alert and oriented x 3. Normal mood.    Labs on Admission: I have personally reviewed following labs and imaging studies  CBC: Recent Labs  Lab 06/11/21 0908  WBC 9.6  NEUTROABS 7.6  HGB 11.4*  HCT 33.9*  MCV 98.5  PLT 188   Basic Metabolic Panel: Recent Labs  Lab 06/11/21 0908  NA 141  K 3.2*  CL 105  CO2 24  GLUCOSE 139*  BUN 23  CREATININE 0.63  CALCIUM 8.7*   GFR: Estimated Creatinine Clearance: 55.8 mL/min (by C-G formula based on SCr of 0.63 mg/dL). Liver Function Tests: No results for input(s): AST, ALT, ALKPHOS, BILITOT, PROT, ALBUMIN in the last 168 hours. No results for input(s): LIPASE, AMYLASE in the last 168 hours. No results for input(s): AMMONIA in the last 168 hours. Coagulation Profile: No results for input(s): INR, PROTIME in the last 168 hours. Cardiac Enzymes: No results for input(s): CKTOTAL, CKMB, CKMBINDEX, TROPONINI in the last 168 hours. BNP (last 3 results) No results for input(s): PROBNP in the last 8760 hours. HbA1C: No results for input(s): HGBA1C in the last 72 hours. CBG: No results for input(s): GLUCAP in the last 168 hours. Lipid Profile: No results for  input(s): CHOL, HDL, LDLCALC, TRIG, CHOLHDL, LDLDIRECT in the last 72 hours. Thyroid Function Tests: No results for input(s): TSH, T4TOTAL, FREET4, T3FREE, THYROIDAB in the last 72 hours. Anemia Panel: No results for input(s): VITAMINB12, FOLATE, FERRITIN, TIBC, IRON, RETICCTPCT in the last 72 hours. Urine analysis: No results found for: COLORURINE, APPEARANCEUR, LABSPEC, PHURINE, GLUCOSEU, HGBUR, BILIRUBINUR, KETONESUR, PROTEINUR, UROBILINOGEN, NITRITE, LEUKOCYTESUR  Radiological Exams on Admission: CT HEAD WO CONTRAST ( )  Result Date: 06/11/2021 CLINICAL DATA:  Unwitnessed fall while going to the bathroom. EXAM: CT HEAD WITHOUT CONTRAST CT CERVICAL SPINE WITHOUT CONTRAST TECHNIQUE: Multidetector CT imaging of the head and cervical spine was performed following the standard protocol without intravenous contrast. Multiplanar CT image reconstructions of the cervical spine were also generated. COMPARISON:  None. FINDINGS: CT HEAD FINDINGS Brain: No evidence of acute infarction, hemorrhage, hydrocephalus, extra-axial collection or mass lesion/mass effect. Vascular: There is minimal atherosclerotic calcification of the internal carotid arteries. No hyperdense vessels. Skull: Normal. Negative for fracture or focal lesion. Sinuses/Orbits: Ethmoid air cells are normal. Partially imaged LEFT maxillary sinus air-fluid level. Mastoid air cells are normal. Other: None CT CERVICAL SPINE FINDINGS Alignment: There is reversal of the cervical lordosis in the mid cervical levels, related to degenerative changes. There is 2 millimeters anterolisthesis of C5 on C6. Skull base and vertebrae: No acute fracture. No primary bone lesion or focal pathologic process. Soft tissues and spinal canal: No prevertebral fluid or swelling. No visible canal hematoma. Disc levels: Significant disc height loss and uncovertebral spurring at C5-6, C6-7, and C7-T1. Upper chest: Negative. Other: Note is made of air-fluid level within the  LEFT maxillary sinus, associated with a minimally displaced fracture of the LATERAL wall. Remote ORIF of the RIGHT clavicle. IMPRESSION: 1.  No evidence for acute intracranial abnormality. 2. Moderate mid cervical degenerative changes without acute fracture. 3. Fracture of the LATERAL wall of the LEFT maxillary sinus, associated with air-fluid level sinus. Electronically Signed   By: Norva Pavlov M.D.  On: 06/11/2021 10:07   CT Cervical Spine Wo Contrast  Result Date: 06/11/2021 CLINICAL DATA:  Unwitnessed fall while going to the bathroom. EXAM: CT HEAD WITHOUT CONTRAST CT CERVICAL SPINE WITHOUT CONTRAST TECHNIQUE: Multidetector CT imaging of the head and cervical spine was performed following the standard protocol without intravenous contrast. Multiplanar CT image reconstructions of the cervical spine were also generated. COMPARISON:  None. FINDINGS: CT HEAD FINDINGS Brain: No evidence of acute infarction, hemorrhage, hydrocephalus, extra-axial collection or mass lesion/mass effect. Vascular: There is minimal atherosclerotic calcification of the internal carotid arteries. No hyperdense vessels. Skull: Normal. Negative for fracture or focal lesion. Sinuses/Orbits: Ethmoid air cells are normal. Partially imaged LEFT maxillary sinus air-fluid level. Mastoid air cells are normal. Other: None CT CERVICAL SPINE FINDINGS Alignment: There is reversal of the cervical lordosis in the mid cervical levels, related to degenerative changes. There is 2 millimeters anterolisthesis of C5 on C6. Skull base and vertebrae: No acute fracture. No primary bone lesion or focal pathologic process. Soft tissues and spinal canal: No prevertebral fluid or swelling. No visible canal hematoma. Disc levels: Significant disc height loss and uncovertebral spurring at C5-6, C6-7, and C7-T1. Upper chest: Negative. Other: Note is made of air-fluid level within the LEFT maxillary sinus, associated with a minimally displaced fracture of the  LATERAL wall. Remote ORIF of the RIGHT clavicle. IMPRESSION: 1.  No evidence for acute intracranial abnormality. 2. Moderate mid cervical degenerative changes without acute fracture. 3. Fracture of the LATERAL wall of the LEFT maxillary sinus, associated with air-fluid level sinus. Electronically Signed   By: Norva Pavlov M.D.   On: 06/11/2021 10:07   DG Chest Portable 1 View  Result Date: 06/11/2021 CLINICAL DATA:  Preoperative examination. EXAM: PORTABLE CHEST 1 VIEW COMPARISON:  09/12/2017 FINDINGS: Grossly unchanged cardiac silhouette given decreased lung volumes and supine technique. Atherosclerotic plaque when the thoracic aorta. Lungs appear hyperexpanded with mild diffuse slightly nodular thickening the pulmonary interstitium. No discrete focal airspace opacities. No pleural effusion or pneumothorax. No evidence of edema. No definite acute osseous abnormalities. Spinal stimulator leads overlie the midthoracic spine. Post ORIF of the right clavicle, incompletely imaged. IMPRESSION: No acute cardiopulmonary disease on this AP supine portable examination. Electronically Signed   By: Simonne Come M.D.   On: 06/11/2021 09:40   DG Hip Unilat W or Wo Pelvis 2-3 Views Left  Result Date: 06/11/2021 CLINICAL DATA:  Unwitnessed fall in the bathroom. EXAM: DG HIP (WITH OR WITHOUT PELVIS) 2-3V LEFT COMPARISON:  None. FINDINGS: There is an acute, horizontal, displaced fracture involving the intertrochanteric region of the femur with foreshortening and anteromedial displacement of the distal fracture fragment. No definitive intra-articular extension. Expected adjacent soft tissue swelling. No radiopaque foreign body No additional fractures identified. Spinal stimulator device overlies the medial aspect the left buttocks. IMPRESSION: Acute, horizontal, displaced left intertrochanteric femur fracture with foreshortening and displacement. Electronically Signed   By: Simonne Come M.D.   On: 06/11/2021 09:38    EKG:  Independently reviewed.  Normal sinus rhythm with nonspecific ST changes  Assessment/Plan Active Problems:   * No active hospital problems. *    1.Status post mechanical fall  History of multiple falls per husband due to Parkinson's  2.  Left hip fracture Plan to go to the OR today Pain control Bowel regimen PT OT when cleared by by orthopedic surgery post surgery  3.  Hypertension-since going to surgery will hold bp meds.  Pharmacy needs to review meds  4. DL- continue statin  5. Parkinsons- needs med reconciliation .     DVT prophylaxis: scd , lovenox when ok by surgery  Code Status: full  Family Communication: husband at bedside  Disposition Plan: home v.s. snf  Consults called: orthopedics  Admission status: Inpatient as patient requires more than 2 midnight stays due to intensity of illness   Lynn ItoSahar Audi Conover MD [facility]   If 7PM-7AM, please contact night-coverage [email or web address] Password [email or web address]  06/11/2021, 10:43 AM

## 2021-06-11 NOTE — Anesthesia Procedure Notes (Signed)
Procedure Name: Intubation Date/Time: 06/11/2021 11:59 AM Performed by: Derenda Mis, CRNA Pre-anesthesia Checklist: Patient identified, Emergency Drugs available, Suction available and Patient being monitored Patient Re-evaluated:Patient Re-evaluated prior to induction Oxygen Delivery Method: Circle system utilized Preoxygenation: Pre-oxygenation with 100% oxygen Induction Type: IV induction Ventilation: Mask ventilation without difficulty Laryngoscope Size: McGraph and 3 Grade View: Grade I Tube type: Oral Tube size: 6.5 mm Number of attempts: 1 Airway Equipment and Method: Oral airway Placement Confirmation: ETT inserted through vocal cords under direct vision, positive ETCO2 and breath sounds checked- equal and bilateral Secured at: 20 cm Tube secured with: Tape Dental Injury: Teeth and Oropharynx as per pre-operative assessment

## 2021-06-11 NOTE — ED Notes (Signed)
Hospitalist at bedside 

## 2021-06-11 NOTE — Anesthesia Postprocedure Evaluation (Signed)
Anesthesia Post Note  Patient: Tina Bowman  Procedure(s) Performed: INTRAMEDULLARY (IM) NAIL INTERTROCHANTRIC (Left)  Patient location during evaluation: PACU Anesthesia Type: General Level of consciousness: awake Pain management: pain level controlled Vital Signs Assessment: post-procedure vital signs reviewed and stable Respiratory status: spontaneous breathing, nonlabored ventilation, respiratory function stable and patient connected to nasal cannula oxygen Cardiovascular status: blood pressure returned to baseline and stable Postop Assessment: no apparent nausea or vomiting Anesthetic complications: no   No notable events documented.   Last Vitals:  Vitals:   06/11/21 0930 06/11/21 1050  BP: (!) 150/67 (!) 154/69  Pulse: 80 92  Resp: 14 14  Temp:  36.7 C  SpO2: 98% 97%    Last Pain:  Vitals:   06/11/21 1050  TempSrc: Oral  PainSc:                  Derenda Mis

## 2021-06-11 NOTE — ED Notes (Signed)
Patient transported to OR.

## 2021-06-11 NOTE — Anesthesia Preprocedure Evaluation (Signed)
Anesthesia Evaluation  Patient identified by MRN, date of birth, ID band Patient awake  General Assessment Comment:AOx3 but slow to respond   Reviewed: Allergy & Precautions, NPO status , Patient's Chart, lab work & pertinent test results  History of Anesthesia Complications Negative for: history of anesthetic complications  Airway Mallampati: III  TM Distance: <3 FB Neck ROM: limited    Dental  (+) Chipped   Pulmonary asthma , sleep apnea and Continuous Positive Airway Pressure Ventilation , COPD,  COPD inhaler, Patient abstained from smoking.Not current smoker,    Pulmonary exam normal breath sounds clear to auscultation       Cardiovascular hypertension, (-) anginaNormal cardiovascular exam Rhythm:Regular Rate:Normal - Systolic murmurs    Neuro/Psych PSYCHIATRIC DISORDERS Parkinson disease  Neuromuscular disease    GI/Hepatic negative GI ROS, Neg liver ROS, neg GERD  ,  Endo/Other  negative endocrine ROS  Renal/GU negative Renal ROS  negative genitourinary   Musculoskeletal  (+) Arthritis , Fibromyalgia -Spinal cord stimulator in lowe back   Abdominal   Peds  Hematology negative hematology ROS (+)   Anesthesia Other Findings Past Medical History: No date: Arthritis No date: Asthma No date: COPD (chronic obstructive pulmonary disease) (HCC) No date: Fibromyalgia No date: Heart disease No date: Hypertension No date: Parkinsonism (HCC) 05/23/2018: Progressive supranuclear palsy (HCC)     Comment:  Pt reported this dx has been recinded by the MD at the               [facility] disorders clinic, and replaced with               Atypical Parkinsonism.. No date: Sleep apnea No date: Slurred speech     Comment:  foggy minded  Past Surgical History: No date: ABDOMINAL HYSTERECTOMY No date: APPENDECTOMY No date: BACK SURGERY No date: BREAST EXCISIONAL BIOPSY; Left     Comment:  2000's No date: BREAST  SURGERY No date: CATARACT EXTRACTION     Comment:  Also, retina surgery No date: CHOLECYSTECTOMY No date: EYE SURGERY No date: FRACTURE SURGERY No date: NARCOTIC PAIN PUMP IMPLANT No date: SHOULDER SURGERY No date: TONSILLECTOMY No date: TUBAL LIGATION  BMI    Body Mass Index: 28.35 kg/m      Reproductive/Obstetrics negative OB ROS                             Anesthesia Physical  Anesthesia Plan  ASA: 3  Anesthesia Plan: General   Post-op Pain Management:    Induction: Intravenous  PONV Risk Score and Plan: 4 or greater and Ondansetron, Dexamethasone and Treatment may vary due to age or medical condition  Airway Management Planned: LMA  Additional Equipment: None  Intra-op Plan:   Post-operative Plan: Extubation in OR  Informed Consent: I have reviewed the patients History and Physical, chart, labs and discussed the procedure including the risks, benefits and alternatives for the proposed anesthesia with the patient or authorized representative who has indicated his/her understanding and acceptance.     Dental Advisory Given  Plan Discussed with: Anesthesiologist, CRNA and Surgeon  Anesthesia Plan Comments: (Presence of spinal cord stimulator makes me hesitate to place a spinal. Discussed risks of anesthesia with patient and husband at bedside, including PONV, sore throat, lip/dental damage, Post operative cognitive dysfunction. Rare risks discussed as well, such as cardiorespiratory and neurological sequelae, and allergic reactions. Patient understands.)        Anesthesia Quick Evaluation

## 2021-06-11 NOTE — ED Provider Notes (Signed)
[facility] Emergency Department Provider Note  ____________________________________________   Event Date/Time   First MD Initiated Contact with Patient 06/11/21 [phone]     (approximate)  I have reviewed the triage vital signs and the nursing notes.   HISTORY  Chief Complaint Hip Injury    HPI Tina Bowman is a 75 y.o. female with COPD, Parkinson's disease, progressive supranuclear palsy who comes in with hip pain.  Patient reports having a mechanical fall today.  She states it was witnessed by her husband.  She states that she was walking with her rolling walker when she tripped and fell onto her left hip.  Patient reporting pain in her left hip, constant, slightly improved with 100 mcg of fentanyl given by EMS, worse with movement.  Patient does report that she hit her head.  Denies any chest wall pain, abdominal pain or any other concerns.  No other extremity pain.          Past Medical History:  Diagnosis Date   Arthritis    Asthma    COPD (chronic obstructive pulmonary disease) (HCC)    Fibromyalgia    Heart disease    Hypertension    Parkinsonism (HCC)    Progressive supranuclear palsy (HCC) 05/23/2018   Pt reported this dx has been recinded by the MD at the [facility] disorders clinic, and replaced with Atypical Parkinsonism..   Sleep apnea    Slurred speech    foggy minded    Patient Active Problem List   Diagnosis Date Noted   Confusion 06/21/2017    Past Surgical History:  Procedure Laterality Date   ABDOMINAL HYSTERECTOMY     APPENDECTOMY     BACK SURGERY     BREAST EXCISIONAL BIOPSY Left    2000's   BREAST SURGERY     CATARACT EXTRACTION     Also, retina surgery   CHOLECYSTECTOMY     COLONOSCOPY N/A 05/11/2021   Procedure: COLONOSCOPY;  Surgeon: Toledo, Boykin Nearing, MD;  Location: [facility] ENDOSCOPY;  Service: Gastroenterology;  Laterality: N/A;  PARKINSON'S   EYE SURGERY     FRACTURE SURGERY     NARCOTIC PAIN PUMP  IMPLANT     SHOULDER SURGERY     TONSILLECTOMY     TUBAL LIGATION      Prior to Admission medications   Medication Sig Start Date End Date Taking? Authorizing Provider  albuterol (PROVENTIL HFA;VENTOLIN HFA) 108 (90 Base) MCG/ACT inhaler Inhale 2 puffs into the lungs every 6 (six) hours as needed for wheezing or shortness of breath.    [provider]  aspirin 325 MG tablet Take 325 mg by mouth daily.    [provider]  atorvastatin (LIPITOR) 40 MG tablet Take 40 mg by mouth every evening.    [provider]  calcium-vitamin D (OSCAL WITH D) 500-200 MG-UNIT per tablet Take 1 tablet by mouth.    [provider]  enalapril (VASOTEC) 20 MG tablet Take 20 mg by mouth daily.    [provider]  hydrALAZINE (APRESOLINE) 25 MG tablet Take 25 mg by mouth 2 (two) times daily.    [provider]  HYDROcodone-acetaminophen (NORCO/VICODIN) 5-325 MG tablet Take 1 tablet by mouth every 4 (four) hours as needed for moderate pain. 06/19/18   Cuthriell, Delorise Royals, PA-C  levalbuterol (XOPENEX) 1.25 MG/3ML nebulizer solution Take 1.25 mg by nebulization every 4 (four) hours as needed for wheezing or shortness of breath.    [provider]  loratadine (CLARITIN) 10 MG tablet Take 10 mg by mouth daily.    [provider]  metoprolol (LOPRESSOR) 50 MG tablet Take 50 mg by mouth 2 (two) times daily.    [provider]  montelukast (SINGULAIR) 10 MG tablet Take 10 mg by mouth at bedtime.    [provider]  omeprazole (PRILOSEC) 40 MG capsule Take 40 mg by mouth daily.    [provider]  ranitidine (ZANTAC) 150 MG tablet Take 150 mg by mouth at bedtime.     [provider]  traMADol (ULTRAM) 50 MG tablet Take by mouth every 6 (six) hours as needed.    [provider]  umeclidinium bromide (INCRUSE ELLIPTA) 62.5 MCG/INH AEPB Inhale 1 puff into the lungs daily.    [provider]     Allergies Methadone, Niacin and related, Oxycontin [oxycodone], Sulfa antibiotics, and Other  Family History  Problem Relation Age of Onset   Heart attack Mother    Heart disease Father    Breast cancer Cousin    Breast cancer Maternal Aunt    Diabetes Maternal Grandfather     Social History Social History   Tobacco Use   Smoking status: Never   Smokeless tobacco: Never  Vaping Use   Vaping Use: Never used  Substance Use Topics   Alcohol use: Not Currently    Comment: social   Drug use: No      Review of Systems Constitutional: No fever/chills Eyes: No visual changes. ENT: No sore throat. Cardiovascular: Denies chest pain. Respiratory: Denies shortness of breath. Gastrointestinal: No abdominal pain.  No nausea, no vomiting.  No diarrhea.  No constipation. Genitourinary: Negative for dysuria. Musculoskeletal: Positive hip pain Skin: Negative for rash. Neurological: Negative for headaches, focal weakness or numbness. All other ROS negative ____________________________________________   PHYSICAL EXAM:  VITAL SIGNS: Blood pressure (!) 151/62, pulse 92, temperature 98 F (36.7 C), temperature source Oral, resp. rate 17, height 5\' 2"  (1.575 m), weight 70.3 kg, SpO2 98 %.   Constitutional: Alert Well appearing and in no acute distress. Eyes: Conjunctivae are normal. EOMI. Head: Atraumatic. Nose: No congestion/rhinnorhea. Mouth/Throat: Mucous membranes are moist.   Neck: No stridor. Trachea Midline. FROM Cardiovascular: Normal rate, regular rhythm. Grossly normal heart sounds.  Good peripheral circulation. Respiratory: Normal respiratory effort.  No retractions. Lungs CTAB. Gastrointestinal: Soft and nontender. No distention. No abdominal bruits.  Musculoskeletal: Mild tenderness at the left hip and into the mid femur.  Able to pick up the right leg but unable to pick up the left leg secondary to pain.  Distally pulses intact Neurologic:  Normal speech and  language. No gross focal neurologic deficits are appreciated.  Skin:  Skin is warm, dry and intact. No rash noted. Psychiatric: Mood and affect are normal. Speech and behavior are normal. GU: Deferred   ____________________________________________   LABS (all labs ordered are listed, but only abnormal results are displayed)  Labs Reviewed  CBC WITH DIFFERENTIAL/PLATELET - Abnormal; Notable for the following components:      Result Value   RBC 3.44 (*)    Hemoglobin 11.4 (*)    HCT 33.9 (*)    All other components within normal limits  RESP PANEL BY RT-PCR (FLU A&B, COVID) ARPGX2  BASIC METABOLIC PANEL   ____________________________________________   ED ECG REPORT I, , the attending physician, personally viewed and interpreted this ECG.  Normal sinus rate of 90, no ST elevation, no T wave inversions, normal intervals ____________________________________________  RADIOLOGY I, Concha Se, personally viewed and evaluated these images (plain radiographs) as part of my medical decision making, as well as reviewing the written report by the radiologist.  ED MD interpretation: Hip fracture on the left  Official radiology report(s): No results found.  ____________________________________________   PROCEDURES  Procedure(s) performed (including Critical Care):  .1-3 Lead EKG Interpretation  Date/Time: 06/11/2021 10:29 AM Performed by: Concha Se, MD Authorized by: Concha Se, MD     Interpretation: normal     ECG rate:  80s   ECG rate assessment: normal     Rhythm: sinus rhythm     Ectopy: none     Conduction: normal     ____________________________________________   INITIAL IMPRESSION / ASSESSMENT AND PLAN / ED COURSE  Dalya ISLAY POLANCO was evaluated in Emergency Department on 06/11/2021 for the symptoms described in the history of present illness. She was evaluated in the context of the global COVID-19 pandemic, which necessitated consideration  that the patient might be at risk for infection with the SARS-CoV-2 virus that causes COVID-19. Institutional protocols and algorithms that pertain to the evaluation of patients at risk for COVID-19 are in a state of rapid change based on information released by regulatory bodies including the CDC and federal and state organizations. These policies and algorithms were followed during the patient's care in the ED.     Patient comes in with a mechanical fall onto left hip.  X-ray ordered to evaluate for dislocation, fracture.  Patient does report hitting her head so we will get CT imaging evaluate for intercranial hemorrhage, cervical fracture.  We will get some basic labs for potential preop and preop EKG.  We will get preop COVID.  X-ray does confirm fracture.  Updated patient on repeat evaluation she is neurovascularly intact.  On reassessment she continues deny any other pain other than the left hip.  We will give a dose of IV fentanyl.  X-ray confirms intertrochanter fracture on the left.  Dr. Joice Lofts plans to do surgery today.  N.p.o. order  placed  CT imaging of her head and neck were negative other than a fracture of the lateral wall of the left maxillary sinus associated air-fluid levels.  I reevaluated patient she is not really any significant tenderness there no bruising noted.  Pupils reactive and eye movements are intact. D/w Dr Irving Shows can do antibiotics- pt already get ancef from ortho-  We will discussed the hospital team for admission for the pelvic fracture   ____________________________________________   FINAL CLINICAL IMPRESSION(S) / ED DIAGNOSES   Final diagnoses:  Closed displaced intertrochanteric fracture of left femur, initial encounter (HCC)  Closed fracture of maxillary sinus, initial encounter (HCC)  Fall, initial encounter      MEDICATIONS GIVEN DURING THIS VISIT:  Medications  ceFAZolin (ANCEF) IVPB 2g/100 mL premix (has no administration in time range)   fentaNYL (SUBLIMAZE) injection 50 mcg (50 mcg Intravenous Given 06/11/21 0935)     ED Discharge Orders     None        Note:  This document was prepared using Dragon voice recognition software and may include unintentional dictation errors.    Concha Se, MD 06/11/21 [phone]

## 2021-06-11 NOTE — ED Triage Notes (Signed)
Per EMS pt coming from home after an unwitnessed fall while going to the bathroom. C/o left hip pain. Given 100 mcg fentanyl PTA. Patient has left leg tucked under right leg with tenderness to left thigh area.

## 2021-06-11 NOTE — Transfer of Care (Signed)
Immediate Anesthesia Transfer of Care Note  Patient: Tina Bowman  Procedure(s) Performed: INTRAMEDULLARY (IM) NAIL INTERTROCHANTRIC (Left)  Patient Location: PACU  Anesthesia Type:General  Level of Consciousness: drowsy  Airway & Oxygen Therapy: Patient Spontanous Breathing  Post-op Assessment: Report given to RN  Post vital signs: stable  Last Vitals:  Vitals Value Taken Time  BP 127/63 06/11/21 1334  Temp 97.1   Pulse 116 06/11/21 1335  Resp 19 06/11/21 1335  SpO2 99 % 06/11/21 1335  Vitals shown include unvalidated device data.  Last Pain:  Vitals:   06/11/21 1050  TempSrc: Oral  PainSc:          Complications: No notable events documented.

## 2021-06-11 NOTE — Op Note (Signed)
06/11/2021  1:39 PM  Patient:   Tina Bowman  Pre-Op Diagnosis:   Closed displaced intertrochanteric fracture, left hip.  Post-Op Diagnosis:   Same  Procedure:   Reduction and internal fixation of displaced intertrochanteric left hip fracture with Biomet Affixis TFN nail.  Surgeon:   Maryagnes Amos, MD  Assistant:   None  Anesthesia:   GET  Findings:   As above.  The fracture was a 2 part reverse obliquity type fracture.  Complications:   None  EBL:   100 cc  Fluids:   400 cc crystalloid  UOP:   400 cc  TT:   None  Drains:   None  Closure:   Staples  Implants:   Biomet Affixis 9 x 340 mm TFN with a 90 mm lag screw and 36 mm and 40 mm distal interlocking screws  Brief Clinical Note:   The patient is a 75 year old female who sustained Earlier this morning when she fell in her home, injuring her left hip. The patient was brought to the emergency room where x-rays demonstrated the above-noted injury. The patient has been cleared medically and presents at this time for reduction and internal fixation of the displaced reverse obliquity intertrochanteric left hip fracture.  Procedure:   The patient was brought into the operating room. After adequate general endotracheal intubation and anesthesia was obtained, a Foley catheter was placed. The patient was transferred to the fracture table and lain in the supine position. The uninjured leg was placed in a flexed and abducted position while the injured lower extremity was placed in longitudinal traction. The fracture was reduced using longitudinal traction and internal rotation. The adequacy of reduction was verified fluoroscopically in AP and lateral projections and found to be near anatomic. The lateral aspects of the left hip and thigh were prepped with ChloraPrep solution before being draped sterilely. Preoperative antibiotics were administered. A timeout was performed to verify the appropriate surgical site.   The greater  trochanter was identified fluoroscopically and an approximately 6-7 cm incision made about 2-3 fingerbreadths above the tip of the greater trochanter. The incision was carried down through the subcutaneous tissues to expose the gluteal fascia. This was split the length of the incision, providing access to the tip of the trochanter. Under fluoroscopic guidance, a guidewire was drilled through the tip of the trochanter into the proximal metaphysis to the level of the lesser trochanter. After verifying its position fluoroscopically in AP and lateral projections, it was overreamed with the initial reamer to the depth of the lesser trochanter. A guidewire was passed down through the femoral canal to the supracondylar region. The adequacy of guidewire position was verified fluoroscopically in AP and lateral projections before the length of the guidewire within the canal was measured and found to be 355 mm. Therefore, a 340 mm length nail was selected. The guidewire was overreamed sequentially using the flexible reamers, beginning with a 9 mm reamer and progressing to an 11 mm reamer. This provided good cortical chatter. The 9 x 340 mm Biomet Affixis TFN rod was selected and advanced to the appropriate depth, as verified fluoroscopically.   The guide system for the lag screw was positioned and advanced through an approximately 2 cm stab incision over the lateral aspect of the proximal femur. The guidewire was drilled up through the trochanteric femoral nail and into the femoral neck to rest within 5 mm of subchondral bone. After verifying its position in the femoral neck and head in both AP  and lateral projections, the guidewire was measured and overreamed using the appropriate reamer. Initially it was felt that an 85 mm lag screw would be sufficient. However, after it was inserted, it was felt to be too short. Therefore, it was removed and the 90 mm lag screw was inserted to the appropriate depth as verified  fluoroscopically in AP and lateral projections. The locking bolt was advanced, then backed off a quarter turn to set the lag screw. Again the adequacy of hardware position and fracture reduction was verified fluoroscopically in AP and lateral projections and found to be excellent.  Attention was directed distally. Using the "perfect circle" technique, the leg and fluoroscopy machine were positioned appropriately. An approximately 2.5 cm incision was made over the skin at the appropriate site before the drill bit was advanced through the cortex and across the static hole of the nail. The appropriate length of the screw was determined before the 36 mm distal interlocking screw was positioned, then advanced and tightened securely. Given that the fracture pattern was equivalent to a subtrochanteric fracture, it was felt best to place 2 distal interlocking screws. Therefore, the drill bit was advanced through the dynamic hole of the nail. The appropriate length screw was determined made for a 40 mm distal interlocking screw was placed, then advanced and tightened securely. Again the adequacy of screw position was verified fluoroscopically in AP and lateral projections and found to be excellent.  The wounds were irrigated thoroughly with sterile saline solution before the abductor fascia was reapproximated using #1 Vicryl interrupted sutures. The subcutaneous tissues were closed using 2-0 Vicryl interrupted sutures. The skin was closed using staples. A total of 20 cc of Exparel mixed with 30 cc of 0.5% Sensorcaine with epinephrine was injected in and around all incisions. Sterile occlusive dressings were applied to all wounds before the patient was woken, extubated, and transferred back to her hospital bed. The patient was then transferred to the recovery room in satisfactory condition after tolerating the procedure well.

## 2021-06-12 DIAGNOSIS — E785 Hyperlipidemia, unspecified: Secondary | ICD-10-CM | POA: Diagnosis not present

## 2021-06-12 DIAGNOSIS — I10 Essential (primary) hypertension: Secondary | ICD-10-CM | POA: Diagnosis not present

## 2021-06-12 DIAGNOSIS — S72002E Fracture of unspecified part of neck of left femur, subsequent encounter for open fracture type I or II with routine healing: Secondary | ICD-10-CM

## 2021-06-12 DIAGNOSIS — G20 Parkinson's disease: Secondary | ICD-10-CM | POA: Diagnosis not present

## 2021-06-12 LAB — BASIC METABOLIC PANEL
Anion gap: 7 (ref 5–15)
BUN: 21 mg/dL (ref 8–23)
CO2: 27 mmol/L (ref 22–32)
Calcium: 8.3 mg/dL — ABNORMAL LOW (ref 8.9–10.3)
Chloride: 105 mmol/L (ref 98–111)
Creatinine, Ser: 0.54 mg/dL (ref 0.44–1.00)
GFR, Estimated: >60 mL/min (ref >60)
Glucose, Bld: 138 mg/dL — ABNORMAL HIGH (ref 70–99)
Potassium: 3.3 mmol/L — ABNORMAL LOW (ref 3.5–5.1)
Sodium: 139 mmol/L (ref 135–145)

## 2021-06-12 LAB — CBC
HCT: 26.8 % — ABNORMAL LOW (ref 36.0–46.0)
Hemoglobin: 8.8 g/dL — ABNORMAL LOW (ref 12.0–15.0)
MCH: 32.7 pg (ref 26.0–34.0)
MCHC: 32.8 g/dL (ref 30.0–36.0)
MCV: 99.6 fL (ref 80.0–100.0)
Platelets: 167 10*3/uL (ref 150–400)
RBC: 2.69 MIL/uL — ABNORMAL LOW (ref 3.87–5.11)
RDW: 12.4 % (ref 11.5–15.5)
WBC: 14.2 10*3/uL — ABNORMAL HIGH (ref 4.0–10.5)
nRBC: 0 % (ref 0.0–0.2)

## 2021-06-12 MED ORDER — POTASSIUM CHLORIDE CRYS ER 20 MEQ PO TBCR
40.0000 meq | EXTENDED_RELEASE_TABLET | Freq: Once | ORAL | Status: AC
  Administered 2021-06-12: 40 meq via ORAL
  Filled 2021-06-12: qty 2

## 2021-06-12 MED ORDER — CARBIDOPA-LEVODOPA 25-100 MG PO TABS
2.0000 | ORAL_TABLET | Freq: Three times a day (TID) | ORAL | Status: DC
  Administered 2021-06-12 – 2021-06-15 (×9): 2 via ORAL
  Filled 2021-06-12 (×10): qty 2

## 2021-06-12 MED ORDER — SODIUM CHLORIDE 0.9 % IV SOLN
INTRAVENOUS | Status: DC | PRN
  Administered 2021-06-12: 250 mL via INTRAVENOUS

## 2021-06-12 NOTE — Evaluation (Signed)
Physical Therapy Evaluation Patient Details Name: Tina Bowman MRN: [id number] DOB: 1946/01/31 Today's Date: 06/12/2021   History of Present Illness  75 yo femal that presents with L hip fx, now s/p IMN. She has PMHx significant for Parkinson's disease, early dementia, sleep apnea, HTN, COPD, CAD, and fibromyalgia.  Clinical Impression  The pt presents this session with a readiness to move. She is able to tolerate a stand pivot transfer from bed<>bedside commode. When attempting standing pre-gait activity the pt is unable to weight shift to the LLE enough for RLE advancement. At this time she would best benefit from SNF placement once medically stable. PT will continue to assess.     Follow Up Recommendations SNF    Equipment Recommendations       Recommendations for Other Services       Precautions / Restrictions Precautions Precautions: Fall Restrictions LLE Weight Bearing: Weight bearing as tolerated      Mobility  Bed Mobility Overal bed mobility: Needs Assistance Bed Mobility: Supine to Sit     Supine to sit: Mod assist     General bed mobility comments: LLE management.    Transfers Overall transfer level: Needs assistance Equipment used: Rolling walker (2 wheeled) Transfers: Sit to/from [employer] Sit to Stand: From elevated surface;Min guard Stand pivot transfers: Mod assist;From elevated surface;+2 safety/equipment       General transfer comment: Pt is impulsive so +2 was helpful for initial mobility however, if room optimized prior to mobility is safe for +1 skilled therapist.  Ambulation/Gait Ambulation/Gait assistance: Max assist Gait Distance (Feet): 0 Feet Assistive device: Rolling walker (2 wheeled)       General Gait Details: Attempted standing marching; pt unable to weightshift onto LLE in order to advance RLE at this time.  Stairs            Wheelchair Mobility    Modified Rankin (Stroke Patients Only)        Balance Overall balance assessment: Needs assistance;History of Falls Sitting-balance support: Feet supported;No upper extremity supported Sitting balance-Leahy Scale: Good     Standing balance support: Bilateral upper extremity supported;During functional activity Standing balance-Leahy Scale: Poor Standing balance comment: Pt unable to shift weight at this time.                             Pertinent Vitals/Pain Pain Assessment: No/denies pain (Initially denied pain, however when asked by husband stated 5/10 pain.)    Home Living Family/patient expects to be discharged to:: Skilled nursing facility Living Arrangements: Spouse/significant other                    Prior Function Level of Independence: Independent with assistive device(s)               Hand Dominance        Extremity/Trunk Assessment   Upper Extremity Assessment Upper Extremity Assessment: Overall WFL for tasks assessed    Lower Extremity Assessment Lower Extremity Assessment: LLE deficits/detail LLE Deficits / Details: 2/5 grossly; pt unable to lift LLE against gravity. LLE Sensation: WNL LLE Coordination: decreased gross motor       Communication   Communication: Expressive difficulties  Cognition Arousal/Alertness: Awake/alert Behavior During Therapy: WFL for tasks assessed/performed Overall Cognitive Status: History of cognitive impairments - at baseline  General Comments      Exercises General Exercises - Lower Extremity Ankle Circles/Pumps: AROM (Pt educated to perform ankle pumps AMAP every hour) Heel Slides: AAROM;10 reps;Supine;Left Hip ABduction/ADduction: AAROM;Supine;Left (8 reps) Other Exercises Other Exercises: Lengthy discussion with patient and husband regarding d/c planning. At this time agreeable to SNF placement in order to optimize functional mobility for fall prevention prior to d/c home. Pt  and husband will require education from case management regarding length of time at rehab to be expected.   Assessment/Plan    PT Assessment Patient needs continued PT services  PT Problem List Decreased strength;Decreased mobility;Decreased activity tolerance;Decreased balance       PT Treatment Interventions DME instruction;Therapeutic activities;Gait training;Functional mobility training;Balance training;Stair training;Therapeutic exercise;Patient/family education    PT Goals (Current goals can be found in the Care Plan section)  Acute Rehab PT Goals Patient Stated Goal: non stated Time For Goal Achievement: 06/26/21 Potential to Achieve Goals: Good    Frequency BID   Barriers to discharge        Co-evaluation               AM-PAC PT "6 Clicks" Mobility  Outcome Measure Help needed turning from your back to your side while in a flat bed without using bedrails?: A Little Help needed moving from lying on your back to sitting on the side of a flat bed without using bedrails?: A Lot Help needed moving to and from a bed to a chair (including a wheelchair)?: A Little Help needed standing up from a chair using your arms (e.g., wheelchair or bedside chair)?: A Little Help needed to walk in hospital room?: A Lot Help needed climbing 3-5 steps with a railing? : Total 6 Click Score: 14    End of Session Equipment Utilized During Treatment: Gait belt Activity Tolerance: Patient tolerated treatment well;Patient limited by fatigue (Pain increased to 6/10.) Patient left: in bed;with bed alarm set;with call bell/phone within reach;with family/visitor present Nurse Communication: Mobility status PT Visit Diagnosis: Unsteadiness on feet (R26.81);Difficulty in walking, not elsewhere classified (R26.2)    Time: 0349-1791 PT Time Calculation (min) (ACUTE ONLY): 34 min   Charges:   PT Evaluation $PT Eval Moderate Complexity: 1 Mod PT Treatments $Therapeutic Activity: 23-37 mins         11:34 AM, 06/12/21 Ruthella Kirchman A. Mordecai Maes PT, DPT Physical Therapist - [facility] A Gee Habig 06/12/2021, 11:32 AM

## 2021-06-12 NOTE — [place] FL2 (Signed)
[place] MEDICAID FL2 LEVEL OF CARE SCREENING TOOL     IDENTIFICATION  Patient Name: Tina Bowman Birthdate: 31-Oct-1945 Sex: female Admission Date (Current Location): 06/11/2021  [facility] and [place] Number:  Chiropodist and Address:  [facility], [address] [place], [place] [address]      Provider Number: [id number]  Attending Physician Name and Address:  Lynn Ito, MD  Relative Name and Phone Number:  Tina, Bowman (Spouse)   [phone] [facility] Phone)    Current Level of Care: Hospital Recommended Level of Care: Skilled Nursing Facility Prior Approval Number:    Date Approved/Denied:   PASRR Number: [id number]  Discharge Plan:      Current Diagnoses: Patient Active Problem List   Diagnosis Date Noted   Hip fracture (HCC) 06/11/2021   Confusion 06/21/2017    Orientation RESPIRATION BLADDER Height & Weight     Self, Situation, Place  Normal Continent Weight: 155 lb (70.3 kg) Height:  5\' 2"  (157.5 cm)  BEHAVIORAL SYMPTOMS/MOOD NEUROLOGICAL BOWEL NUTRITION STATUS        Diet (heart diet; thin liquids)  AMBULATORY STATUS COMMUNICATION OF NEEDS Skin   Limited Assist Verbally Surgical wounds (L thigh)                       Personal Care Assistance Level of Assistance  Bathing, Feeding, Dressing Bathing Assistance: Limited assistance Feeding assistance: Limited assistance Dressing Assistance: Limited assistance     Functional Limitations Info             SPECIAL CARE FACTORS FREQUENCY  PT (By licensed PT), OT (By licensed OT)     PT Frequency: 5 x/week OT Frequency: 5 x/week            Contractures      Additional Factors Info  Code Status, Allergies Code Status Info: full Allergies Info: Methadone, Niacin And Related, Oxycontin (Oxycodone), Sulfa Antibiotics, Other           Current Medications (06/12/2021):  This is the current hospital active medication list Current  Facility-Administered Medications  Medication Dose Route Frequency Provider Last Rate Last Admin   0.9 %  sodium chloride infusion   Intravenous PRN Poggi, 06/14/2021, MD 10 mL/hr at 06/12/21 0626 250 mL at 06/12/21 0626   0.9 % NaCl with KCl 20 mEq/ L  infusion   Intravenous Continuous Poggi, 06/14/21, MD   Stopped at 06/12/21 0048   acetaminophen (TYLENOL) tablet 325-650 mg  325-650 mg Oral Q6H PRN Poggi, 06/14/21, MD       atorvastatin (LIPITOR) tablet 40 mg  40 mg Oral QPM Poggi, Excell Seltzer, MD   40 mg at 06/11/21 1739   bisacodyl (DULCOLAX) EC tablet 5 mg  5 mg Oral Daily PRN Poggi, 06/13/21, MD       bisacodyl (DULCOLAX) suppository 10 mg  10 mg Rectal Daily PRN Poggi, Excell Seltzer, MD       carbidopa-levodopa (SINEMET IR) 25-100 MG per tablet immediate release 2 tablet  2 tablet Oral TID Excell Seltzer, RPH       diphenhydrAMINE (BENADRYL) 12.5 MG/5ML elixir 12.5-25 mg  12.5-25 mg Oral Q4H PRN Poggi, 11-22-1984, MD       docusate sodium (COLACE) capsule 100 mg  100 mg Oral BID Poggi, Excell Seltzer, MD   100 mg at 06/12/21 1021   enoxaparin (LOVENOX) injection 40 mg  40 mg Subcutaneous Q24H Poggi, 06/14/21, MD  40 mg at 06/12/21 1022   HYDROcodone-acetaminophen (NORCO/VICODIN) 5-325 MG per tablet 1-2 tablet  1-2 tablet Oral Q4H PRN Poggi, Excell Seltzer, MD   2 tablet at 06/12/21 0242   magnesium hydroxide (MILK OF MAGNESIA) suspension 30 mL  30 mL Oral Daily PRN Poggi, Excell Seltzer, MD       metoCLOPramide (REGLAN) tablet 5-10 mg  5-10 mg Oral Q8H PRN Poggi, Excell Seltzer, MD       Or   metoCLOPramide (REGLAN) injection 5-10 mg  5-10 mg Intravenous Q8H PRN Poggi, Excell Seltzer, MD       morphine 2 MG/ML injection 1-2 mg  1-2 mg Intravenous Q2H PRN Poggi, Excell Seltzer, MD   2 mg at 06/11/21 1845   ondansetron (ZOFRAN) tablet 4 mg  4 mg Oral Q6H PRN Poggi, Excell Seltzer, MD       Or   ondansetron (ZOFRAN) injection 4 mg  4 mg Intravenous Q6H PRN Poggi, Excell Seltzer, MD       pantoprazole (PROTONIX) EC tablet 40 mg  40 mg Oral Daily Poggi, Excell Seltzer, MD   40 mg at  06/12/21 1021   pneumococcal 23 valent vaccine (PNEUMOVAX-23) injection 0.5 mL  0.5 mL Intramuscular Tomorrow-1000 Poggi, Excell Seltzer, MD       senna-docusate (Senokot-S) tablet 1 tablet  1 tablet Oral QHS PRN Poggi, Excell Seltzer, MD       sodium phosphate (FLEET) 7-19 GM/118ML enema 1 enema  1 enema Rectal Once PRN Poggi, Excell Seltzer, MD       traMADol Janean Sark) tablet 50 mg  50 mg Oral Q6H Poggi, Excell Seltzer, MD   50 mg at 06/12/21 1324     Discharge Medications: Please see discharge summary for a list of discharge medications.  Relevant Imaging Results:  Relevant Lab Results:   Additional Information SS #: [id number]  Tina Bowman E Tina Boman, LCSW

## 2021-06-12 NOTE — Progress Notes (Signed)
Physical Therapy Treatment Patient Details Name: Tina Bowman MRN: [id number] DOB: 09/02/1946 Today's Date: 06/12/2021    History of Present Illness 75 yo femal that presents with L hip fx, now s/p IMN. She has PMHx significant for Parkinson's disease, early dementia, sleep apnea, HTN, COPD, CAD, and fibromyalgia.    PT Comments    Pt impulsively attempting to get out of bed to use restroom at arrival. She presents with facial expression that imply frustration and pain. The pt is able to complete stand pivot transfer +1 this session. She presents with limited ability to WB through LLE increasing safety risk with mobility. At this time the pt continues to be most appropriate for SNF in order to optimize mobility. Pt states that she is completing HEP, however unable to recall exercises when asked to perform them. Session brief d/t pt discomfort.    Follow Up Recommendations  SNF     Equipment Recommendations       Recommendations for Other Services       Precautions / Restrictions Precautions Precautions: Fall Restrictions LLE Weight Bearing: Weight bearing as tolerated    Mobility  Bed Mobility Overal bed mobility: Needs Assistance Bed Mobility: Supine to Sit     Supine to sit: Min assist     General bed mobility comments: LLE management.    Transfers Overall transfer level: Needs assistance Equipment used: None Transfers: Stand Pivot Transfers;Sit to/from Stand Sit to Stand: Min assist Stand pivot transfers: Mod assist       General transfer comment: Pt impulsively trying to get to bedside commode on arrival. Stand pivot to [facility] without AD, MOD A. Used RW, Mod A, pt presenting with inability to WB through affected LE.  Ambulation/Gait                 Stairs             Wheelchair Mobility    Modified Rankin (Stroke Patients Only)       Balance     Sitting balance-Leahy Scale: Good       Standing balance-Leahy Scale: Poor                               Cognition Arousal/Alertness: Awake/alert Behavior During Therapy: WFL for tasks assessed/performed Overall Cognitive Status: History of cognitive impairments - at baseline                                        Exercises      General Comments        Pertinent Vitals/Pain Pain Assessment: Faces Faces Pain Scale: Hurts a little bit Pain Location: unable to state location of discomfort    Home Living                      Prior Function            PT Goals (current goals can now be found in the care plan section) Acute Rehab PT Goals Patient Stated Goal: non stated Time For Goal Achievement: 06/26/21 Potential to Achieve Goals: Good Progress towards PT goals: Progressing toward goals    Frequency    BID      PT Plan Current plan remains appropriate    Co-evaluation              AM-PAC  PT "6 Clicks" Mobility   Outcome Measure  Help needed turning from your back to your side while in a flat bed without using bedrails?: A Little Help needed moving from lying on your back to sitting on the side of a flat bed without using bedrails?: A Little Help needed moving to and from a bed to a chair (including a wheelchair)?: A Lot Help needed standing up from a chair using your arms (e.g., wheelchair or bedside chair)?: A Little Help needed to walk in hospital room?: A Lot Help needed climbing 3-5 steps with a railing? : Total 6 Click Score: 14    End of Session   Activity Tolerance: Patient tolerated treatment well;Patient limited by pain Patient left: in bed;with bed alarm set;with call bell/phone within reach Nurse Communication: Mobility status PT Visit Diagnosis: Unsteadiness on feet (R26.81);Difficulty in walking, not elsewhere classified (R26.2);Muscle weakness (generalized) (M62.81);Pain Pain - Right/Left: Left Pain - part of body: Hip     Time: 2035-5974 PT Time Calculation (min) (ACUTE ONLY): 9  min  Charges:  $Therapeutic Activity: 8-22 mins                     4:20 PM, 06/12/21 Zakhia Seres A. Mordecai Maes PT, DPT Physical Therapist - [facility] A Cleave Ternes 06/12/2021, 4:17 PM

## 2021-06-12 NOTE — Progress Notes (Signed)
PROGRESS NOTE    Tina Bowman  HGD:[id number] DOB: 02/20/1946 DOA: 06/11/2021 PCP: Barbette Reichmann, MD    Brief Narrative:  Tina Bowman is a 75 y.o. female with history of Parkinson's disease, asthma, hypertension, HCC, slurred speech and status multiple falls presents complaining of hip pain.  Patient went to the bathroom and as she was getting up she stated her underwear got caught on her feet and she had a mechanical fall.  Apparently was witnessed by husband.  She normally uses her rolling walker.  She was complaining of left hip pain.  EMS was called and patient was brought in.  Other than today she had fallen 10 days ago and 3 weeks ago per husband.  Per husband history of multiple falls due to her Parkinson's  8/21 status post IM nail intertrochanteric done by Dr. Joice Lofts on 8/20  Consultants:  Orthopedics  Procedures:   Antimicrobials:      Subjective: Has no pain.  Husband at bedside.  No shortness of breath, chest pain or any other complaints  Objective: Vitals:   06/11/21 1651 06/11/21 2008 06/12/21 0406 06/12/21 0815  BP: (!) 145/62 130/73 129/66 127/61  Pulse: 95 93 (!) 101 (!) 101  Resp: 17 16 16 18   Temp: 97.9 F (36.6 C) 98.3 F (36.8 C) 98.2 F (36.8 C) 98 F (36.7 C)  TempSrc: Oral Oral Oral   SpO2: 97% 96% 98% 99%  Weight:      Height:        Intake/Output Summary (Last 24 hours) at 06/12/2021 0848 Last data filed at 06/12/2021 0409 Gross per 24 hour  Intake 698.19 ml  Output 950 ml  Net -251.81 ml   Filed Weights   06/11/21 0843  Weight: 70.3 kg    Examination:  General exam: Appears calm and comfortable  Respiratory system: Clear to auscultation. Respiratory effort normal. Cardiovascular system: S1 & S2 heard, RRR. No gallop  Gastrointestinal system: Abdomen is nondistended, soft and nontender.  Normal bowel sounds heard. Central nervous system: Alert and oriented. No focal neurological deficits. Extremities: No edema Psychiatry:  Judgement and insight appear normal. Mood & affect appropriate.     Data Reviewed: I have personally reviewed following labs and imaging studies  CBC: Recent Labs  Lab 06/11/21 0908 06/12/21 0505  WBC 9.6 14.2*  NEUTROABS 7.6  --   HGB 11.4* 8.8*  HCT 33.9* 26.8*  MCV 98.5 99.6  PLT 188 167   Basic Metabolic Panel: Recent Labs  Lab 06/11/21 0908 06/12/21 0505  NA 141 139  K 3.2* 3.3*  CL 105 105  CO2 24 27  GLUCOSE 139* 138*  BUN 23 21  CREATININE 0.63 0.54  CALCIUM 8.7* 8.3*   GFR: Estimated Creatinine Clearance: 55.8 mL/min (by C-G formula based on SCr of 0.54 mg/dL). Liver Function Tests: No results for input(s): AST, ALT, ALKPHOS, BILITOT, PROT, ALBUMIN in the last 168 hours. No results for input(s): LIPASE, AMYLASE in the last 168 hours. No results for input(s): AMMONIA in the last 168 hours. Coagulation Profile: Recent Labs  Lab 06/11/21 0908  INR 1.0   Cardiac Enzymes: No results for input(s): CKTOTAL, CKMB, CKMBINDEX, TROPONINI in the last 168 hours. BNP (last 3 results) No results for input(s): PROBNP in the last 8760 hours. HbA1C: No results for input(s): HGBA1C in the last 72 hours. CBG: No results for input(s): GLUCAP in the last 168 hours. Lipid Profile: No results for input(s): CHOL, HDL, LDLCALC, TRIG, CHOLHDL, LDLDIRECT in the last  72 hours. Thyroid Function Tests: No results for input(s): TSH, T4TOTAL, FREET4, T3FREE, THYROIDAB in the last 72 hours. Anemia Panel: No results for input(s): VITAMINB12, FOLATE, FERRITIN, TIBC, IRON, RETICCTPCT in the last 72 hours. Sepsis Labs: No results for input(s): PROCALCITON, LATICACIDVEN in the last 168 hours.  Recent Results (from the past 240 hour(s))  Resp Panel by RT-PCR (Flu A&B, Covid) Nasopharyngeal Swab     Status: None   Collection Time: 06/11/21  9:38 AM   Specimen: Nasopharyngeal Swab; Nasopharyngeal(NP) swabs in vial transport medium  Result Value Ref Range Status   SARS Coronavirus 2  by RT PCR NEGATIVE NEGATIVE Final    Comment: (NOTE) SARS-CoV-2 target nucleic acids are NOT DETECTED.  The SARS-CoV-2 RNA is generally detectable in upper respiratory specimens during the acute phase of infection. The lowest concentration of SARS-CoV-2 viral copies this assay can detect is 138 copies/mL. A negative result does not preclude SARS-Cov-2 infection and should not be used as the sole basis for treatment or other patient management decisions. A negative result may occur with  improper specimen collection/handling, submission of specimen other than nasopharyngeal swab, presence of viral mutation(s) within the areas targeted by this assay, and inadequate number of viral copies(<138 copies/mL). A negative result must be combined with clinical observations, patient history, and epidemiological information. The expected result is Negative.  Fact Sheet for Patients:  BloggerCourse.com  Fact Sheet for Healthcare Providers:  SeriousBroker.it  This test is no t yet approved or cleared by the [place] FDA and  has been authorized for detection and/or diagnosis of SARS-CoV-2 by FDA under an Emergency Use Authorization (EUA). This EUA will remain  in effect (meaning this test can be used) for the duration of the COVID-19 declaration under Section 564(b)(1) of the Act, 21 U.S.C.section 360bbb-3(b)(1), unless the authorization is terminated  or revoked sooner.       Influenza A by PCR NEGATIVE NEGATIVE Final   Influenza B by PCR NEGATIVE NEGATIVE Final    Comment: (NOTE) The Xpert Xpress SARS-CoV-2/FLU/RSV plus assay is intended as an aid in the diagnosis of influenza from Nasopharyngeal swab specimens and should not be used as a sole basis for treatment. Nasal washings and aspirates are unacceptable for Xpert Xpress SARS-CoV-2/FLU/RSV testing.  Fact Sheet for Patients: BloggerCourse.com  Fact  Sheet for Healthcare Providers: SeriousBroker.it  This test is not yet approved or cleared by the [place] FDA and has been authorized for detection and/or diagnosis of SARS-CoV-2 by FDA under an Emergency Use Authorization (EUA). This EUA will remain in effect (meaning this test can be used) for the duration of the COVID-19 declaration under Section 564(b)(1) of the Act, 21 U.S.C. section 360bbb-3(b)(1), unless the authorization is terminated or revoked.  Performed at [facility], [address], [place], [place] [address]          Radiology Studies: CT HEAD WO CONTRAST ( )  Result Date: 06/11/2021 CLINICAL DATA:  Unwitnessed fall while going to the bathroom. EXAM: CT HEAD WITHOUT CONTRAST CT CERVICAL SPINE WITHOUT CONTRAST TECHNIQUE: Multidetector CT imaging of the head and cervical spine was performed following the standard protocol without intravenous contrast. Multiplanar CT image reconstructions of the cervical spine were also generated. COMPARISON:  None. FINDINGS: CT HEAD FINDINGS Brain: No evidence of acute infarction, hemorrhage, hydrocephalus, extra-axial collection or mass lesion/mass effect. Vascular: There is minimal atherosclerotic calcification of the internal carotid arteries. No hyperdense vessels. Skull: Normal. Negative for fracture or focal lesion. Sinuses/Orbits: Ethmoid air cells are  normal. Partially imaged LEFT maxillary sinus air-fluid level. Mastoid air cells are normal. Other: None CT CERVICAL SPINE FINDINGS Alignment: There is reversal of the cervical lordosis in the mid cervical levels, related to degenerative changes. There is 2 millimeters anterolisthesis of C5 on C6. Skull base and vertebrae: No acute fracture. No primary bone lesion or focal pathologic process. Soft tissues and spinal canal: No prevertebral fluid or swelling. No visible canal hematoma. Disc levels: Significant disc height loss and uncovertebral  spurring at C5-6, C6-7, and C7-T1. Upper chest: Negative. Other: Note is made of air-fluid level within the LEFT maxillary sinus, associated with a minimally displaced fracture of the LATERAL wall. Remote ORIF of the RIGHT clavicle. IMPRESSION: 1.  No evidence for acute intracranial abnormality. 2. Moderate mid cervical degenerative changes without acute fracture. 3. Fracture of the LATERAL wall of the LEFT maxillary sinus, associated with air-fluid level sinus. Electronically Signed   By: Norva Pavlov M.D.   On: 06/11/2021 10:07   CT Cervical Spine Wo Contrast  Result Date: 06/11/2021 CLINICAL DATA:  Unwitnessed fall while going to the bathroom. EXAM: CT HEAD WITHOUT CONTRAST CT CERVICAL SPINE WITHOUT CONTRAST TECHNIQUE: Multidetector CT imaging of the head and cervical spine was performed following the standard protocol without intravenous contrast. Multiplanar CT image reconstructions of the cervical spine were also generated. COMPARISON:  None. FINDINGS: CT HEAD FINDINGS Brain: No evidence of acute infarction, hemorrhage, hydrocephalus, extra-axial collection or mass lesion/mass effect. Vascular: There is minimal atherosclerotic calcification of the internal carotid arteries. No hyperdense vessels. Skull: Normal. Negative for fracture or focal lesion. Sinuses/Orbits: Ethmoid air cells are normal. Partially imaged LEFT maxillary sinus air-fluid level. Mastoid air cells are normal. Other: None CT CERVICAL SPINE FINDINGS Alignment: There is reversal of the cervical lordosis in the mid cervical levels, related to degenerative changes. There is 2 millimeters anterolisthesis of C5 on C6. Skull base and vertebrae: No acute fracture. No primary bone lesion or focal pathologic process. Soft tissues and spinal canal: No prevertebral fluid or swelling. No visible canal hematoma. Disc levels: Significant disc height loss and uncovertebral spurring at C5-6, C6-7, and C7-T1. Upper chest: Negative. Other: Note is made  of air-fluid level within the LEFT maxillary sinus, associated with a minimally displaced fracture of the LATERAL wall. Remote ORIF of the RIGHT clavicle. IMPRESSION: 1.  No evidence for acute intracranial abnormality. 2. Moderate mid cervical degenerative changes without acute fracture. 3. Fracture of the LATERAL wall of the LEFT maxillary sinus, associated with air-fluid level sinus. Electronically Signed   By: Norva Pavlov M.D.   On: 06/11/2021 10:07   DG Chest Portable 1 View  Result Date: 06/11/2021 CLINICAL DATA:  Preoperative examination. EXAM: PORTABLE CHEST 1 VIEW COMPARISON:  09/12/2017 FINDINGS: Grossly unchanged cardiac silhouette given decreased lung volumes and supine technique. Atherosclerotic plaque when the thoracic aorta. Lungs appear hyperexpanded with mild diffuse slightly nodular thickening the pulmonary interstitium. No discrete focal airspace opacities. No pleural effusion or pneumothorax. No evidence of edema. No definite acute osseous abnormalities. Spinal stimulator leads overlie the midthoracic spine. Post ORIF of the right clavicle, incompletely imaged. IMPRESSION: No acute cardiopulmonary disease on this AP supine portable examination. Electronically Signed   By: Simonne Come M.D.   On: 06/11/2021 09:40   DG C-Arm 1-60 Min  Result Date: 06/11/2021 CLINICAL DATA:  LEFT femoral IM nail. EXAM: DG C-ARM 1-60 MIN; LEFT FEMUR 2 VIEWS FLUOROSCOPY TIME:  Fluoroscopy Time:  41 seconds Number of Acquired Spot Images: 18 COMPARISON:  06/11/2021 FINDINGS: Images are performed during the placement of intramedullary nail with proximal and distal fixation in the LEFT femur. IMPRESSION: ORIF of intertrochanteric fracture of the LEFT femur. Electronically Signed   By: Norva PavlovElizabeth  Brown M.D.   On: 06/11/2021 13:18   DG Hip Unilat W or Wo Pelvis 2-3 Views Left  Result Date: 06/11/2021 CLINICAL DATA:  Unwitnessed fall in the bathroom. EXAM: DG HIP (WITH OR WITHOUT PELVIS) 2-3V LEFT COMPARISON:   None. FINDINGS: There is an acute, horizontal, displaced fracture involving the intertrochanteric region of the femur with foreshortening and anteromedial displacement of the distal fracture fragment. No definitive intra-articular extension. Expected adjacent soft tissue swelling. No radiopaque foreign body No additional fractures identified. Spinal stimulator device overlies the medial aspect the left buttocks. IMPRESSION: Acute, horizontal, displaced left intertrochanteric femur fracture with foreshortening and displacement. Electronically Signed   By: Simonne ComeJohn  Watts M.D.   On: 06/11/2021 09:38   DG FEMUR MIN 2 VIEWS LEFT  Result Date: 06/11/2021 CLINICAL DATA:  LEFT femoral IM nail. EXAM: DG C-ARM 1-60 MIN; LEFT FEMUR 2 VIEWS FLUOROSCOPY TIME:  Fluoroscopy Time:  41 seconds Number of Acquired Spot Images: 18 COMPARISON:  06/11/2021 FINDINGS: Images are performed during the placement of intramedullary nail with proximal and distal fixation in the LEFT femur. IMPRESSION: ORIF of intertrochanteric fracture of the LEFT femur. Electronically Signed   By: Norva PavlovElizabeth  Brown M.D.   On: 06/11/2021 13:18        Scheduled Meds:  acetaminophen  500 mg Oral Q6H   atorvastatin  40 mg Oral QPM   docusate sodium  100 mg Oral BID   enoxaparin (LOVENOX) injection  40 mg Subcutaneous Q24H   ketorolac  7.5 mg Intravenous Q6H   pantoprazole  40 mg Oral Daily   pneumococcal 23 valent vaccine  0.5 mL Intramuscular Tomorrow-1000   traMADol  50 mg Oral Q6H   Continuous Infusions:  sodium chloride 250 mL (06/12/21 0626)   0.9 % NaCl with KCl 20 mEq / L Stopped (06/12/21 0048)    Assessment & Plan:   Active Problems:   Hip fracture (HCC)   1.Status post mechanical fall  Left hip fracture: History of multiple falls per husband due to Parkinson's 8/21 Ortho following Status post reduction/internal fixation of  displaced intertrochanteric Lt hip fx with nail Pain management Bowel regimen PT recommends SNF       2.  Hypertension- Stable.  BP meds were held since she is going to surgery.  Currently normotensive will monitor and resume when needed     3. DL-continue statin   4. Parkinsons-restart her Sinemet  DVT prophylaxis: Lovenox Code Status: Full Family Communication: Husband at bedside Disposition Plan:  Status is: Inpatient  Remains inpatient appropriate because:Inpatient level of care appropriate due to severity of illness  Dispo: The patient is from: Home              Anticipated d/c is to: SNF              Patient currently is not medically stable to d/c.   Difficult to place patient No            LOS: 1 day   Time spent: 35 minutes with more than 50% on COC    Lynn ItoSahar Rexann Lueras, MD [facility] Pager [phone]  If 7PM-7AM, please contact night-coverage 06/12/2021, 8:48 AM

## 2021-06-12 NOTE — Progress Notes (Signed)
  Subjective: 1 Day Post-Op Procedure(s) (LRB): INTRAMEDULLARY (IM) NAIL INTERTROCHANTRIC (Left) Patient reports pain as moderate.   Patient is well, and has had no acute complaints or problems Plan is to go Rehab after hospital stay. Negative for chest pain and shortness of breath Fever: no Gastrointestinal:Negative for nausea and vomiting  Objective: Vital signs in last 24 hours: Temp:  [97.1 F (36.2 C)-98.3 F (36.8 C)] 98.2 F (36.8 C) (08/21 0406) Pulse Rate:  [80-118] 101 (08/21 0406) Resp:  [10-19] 16 (08/21 0406) BP: (116-154)/(57-74) 129/66 (08/21 0406) SpO2:  [94 %-100 %] 98 % (08/21 0406) Weight:  [70.3 kg] 70.3 kg (08/20 0843)  Intake/Output from previous day:  Intake/Output Summary (Last 24 hours) at 06/12/2021 0644 Last data filed at 06/12/2021 0409 Gross per 24 hour  Intake 698.19 ml  Output 950 ml  Net -251.81 ml    Intake/Output this shift: Total I/O In: 298.2 [I.V.:104.5; IV Piggyback:193.7] Out: 450 [Urine:450]  Labs: Recent Labs    06/11/21 0908 06/12/21 0505  HGB 11.4* 8.8*   Recent Labs    06/11/21 0908 06/12/21 0505  WBC 9.6 14.2*  RBC 3.44* 2.69*  HCT 33.9* 26.8*  PLT 188 167   Recent Labs    06/11/21 0908 06/12/21 0505  NA 141 139  K 3.2* 3.3*  CL 105 105  CO2 24 27  BUN 23 21  CREATININE 0.63 0.54  GLUCOSE 139* 138*  CALCIUM 8.7* 8.3*   Recent Labs    06/11/21 0908  INR 1.0     EXAM General - Patient is Alert and Confused Extremity - Neurovascular intact Sensation intact distally Dorsiflexion/Plantar flexion intact Compartment soft Dressing/Incision - clean, dry, blood tinged and scant  drainage Motor Function - intact, moving foot and toes well on exam.   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   Sleep apnea    Slurred speech    foggy minded    Assessment/Plan: 1 Day Post-Op Procedure(s) (LRB): INTRAMEDULLARY (IM) NAIL INTERTROCHANTRIC (Left) Active Problems:   Hip fracture (HCC)  Estimated body mass index is 28.35 kg/m as calculated from the following:   Height as of this encounter: 5\' 2"  (1.575 m).   Weight as of this encounter: 70.3 kg. Advance diet Up with therapy  DVT Prophylaxis - Lovenox, Foot Pumps, and TED hose Weight-Bearing as tolerated to Left leg  , PA-C Orthopaedic Surgery 06/12/2021, 6:44 AM

## 2021-06-12 NOTE — TOC Initial Note (Signed)
Transition of Care [facility]) - Initial/Assessment Note    Patient Details  Name: Tina Bowman MRN: [id number] Date of Birth: 13-Feb-1946  [facility]) CM/SW Contact:    Bing Quarry, RN Phone Number: 06/12/2021, 1:08 PM  Clinical Narrative:  Patient admitted 8/20 and is POD#1 for Left Hip Fracture surgery.  History of Parkinson's currently affecting patient per spouse.  PT recommends SNF for short term rehab at discharge. Spoke with spouse, [name] at [phone] (Discussed SNF choices and will provide Medicare List today. Patient living at home with husband as primary caregiver when needed. A few step to back entry with railing present. One level home. No issues with transportation (He drives) or obtaining medications. Spouse assist patient with bathing in a shower/tub, but is going to convert to walk-in shower as he had to help her due to balance issues related to Parkinson's. States patient walks regularly and is fairly strong, mostly independent,  just balance issues. Has PCP Dr. Cora Daniels. Uses [facility] [place], [place] - [address] EDD not till Tuesday at this time. TOC will follow up with SNF choices. Gabriel Cirri RN CM                   Patient Goals and CMS Choice        Expected Discharge Plan and Services                                                Prior Living Arrangements/Services                       Activities of Daily Living Home Assistive Devices/Equipment: Eyeglasses, Environmental consultant (specify type), CPAP ADL Screening (condition at time of admission) Patient's cognitive ability adequate to safely complete daily activities?: Yes Is the patient deaf or have difficulty hearing?: Yes Does the patient have difficulty seeing, even when wearing glasses/contacts?: Yes Does the patient have difficulty concentrating, remembering, or making decisions?: Yes Patient able to express need for assistance with ADLs?: Yes Does  the patient have difficulty dressing or bathing?: Yes Independently performs ADLs?: No Communication: Independent Dressing (OT): Needs assistance Is this a change from baseline?: Pre-admission baseline Grooming: Needs assistance Is this a change from baseline?: Pre-admission baseline Feeding: Independent Bathing: Needs assistance Is this a change from baseline?: Pre-admission baseline Toileting: Needs assistance Is this a change from baseline?: Pre-admission baseline In/Out Bed: Needs assistance Is this a change from baseline?: Pre-admission baseline Walks in Home: Independent with device (comment) Does the patient have difficulty walking or climbing stairs?: Yes Weakness of Legs: Both Weakness of Arms/Hands: Both  Permission Sought/Granted                  Emotional Assessment              Admission diagnosis:  Fracture [T14.8XXA] Hip fracture (HCC) [S72.009A] Closed displaced intertrochanteric fracture of left femur, initial encounter (HCC) [S72.142A] Fall, initial encounter [W19.XXXA] Closed fracture of maxillary sinus, initial encounter [facility]) [S02.401A] Patient Active Problem List   Diagnosis Date Noted   Hip fracture (HCC) 06/11/2021   Confusion 06/21/2017   PCP:  Barbette Reichmann, MD Pharmacy:   [facility] [address] [place], [place] - [address] [place] [address] Phone: [phone] Fax: [phone]     Social Determinants  of Health (SDOH) Interventions    Readmission Risk Interventions No flowsheet data found.

## 2021-06-13 ENCOUNTER — Encounter: Payer: Self-pay | Admitting: Surgery

## 2021-06-13 DIAGNOSIS — D62 Acute posthemorrhagic anemia: Secondary | ICD-10-CM

## 2021-06-13 DIAGNOSIS — E785 Hyperlipidemia, unspecified: Secondary | ICD-10-CM | POA: Diagnosis not present

## 2021-06-13 DIAGNOSIS — S72002E Fracture of unspecified part of neck of left femur, subsequent encounter for open fracture type I or II with routine healing: Secondary | ICD-10-CM | POA: Diagnosis not present

## 2021-06-13 DIAGNOSIS — G20 Parkinson's disease: Secondary | ICD-10-CM | POA: Diagnosis not present

## 2021-06-13 LAB — CBC
HCT: 22.2 % — ABNORMAL LOW (ref 36.0–46.0)
Hemoglobin: 7.2 g/dL — ABNORMAL LOW (ref 12.0–15.0)
MCH: 32 pg (ref 26.0–34.0)
MCHC: 32.4 g/dL (ref 30.0–36.0)
MCV: 98.7 fL (ref 80.0–100.0)
Platelets: 134 10*3/uL — ABNORMAL LOW (ref 150–400)
RBC: 2.25 MIL/uL — ABNORMAL LOW (ref 3.87–5.11)
RDW: 13 % (ref 11.5–15.5)
WBC: 9.1 10*3/uL (ref 4.0–10.5)
nRBC: 0 % (ref 0.0–0.2)

## 2021-06-13 LAB — POTASSIUM: Potassium: 3.3 mmol/L — ABNORMAL LOW (ref 3.5–5.1)

## 2021-06-13 LAB — HEMOGLOBIN AND HEMATOCRIT, BLOOD
HCT: 24.1 % — ABNORMAL LOW (ref 36.0–46.0)
Hemoglobin: 7.9 g/dL — ABNORMAL LOW (ref 12.0–15.0)

## 2021-06-13 MED ORDER — ADULT MULTIVITAMIN W/MINERALS CH
1.0000 | ORAL_TABLET | Freq: Every day | ORAL | Status: DC
  Administered 2021-06-13 – 2021-06-15 (×3): 1 via ORAL
  Filled 2021-06-13 (×2): qty 1

## 2021-06-13 MED ORDER — POTASSIUM CHLORIDE CRYS ER 20 MEQ PO TBCR
40.0000 meq | EXTENDED_RELEASE_TABLET | Freq: Once | ORAL | Status: AC
  Administered 2021-06-13: 40 meq via ORAL
  Filled 2021-06-13: qty 2

## 2021-06-13 NOTE — Progress Notes (Signed)
Physical Therapy Treatment Patient Details Name: Tina Bowman MRN: [id number] DOB: 02/13/1946 Today's Date: 06/13/2021    History of Present Illness 75 yo femal that presents with L hip fx, now s/p IMN. She has PMHx significant for Parkinson's disease, early dementia, sleep apnea, HTN, COPD, CAD, and fibromyalgia.    PT Comments    Ready to return to bed after 2 hours.  Stood with min a x 1 and transferred to bed with mod a x 1 with close assist and returned to supine with heavy mod a x 1.  Positioned for comfort.  Pt more talkative this session.   Follow Up Recommendations  SNF     Equipment Recommendations       Recommendations for Other Services       Precautions / Restrictions Precautions Precautions: Fall Restrictions Weight Bearing Restrictions: Yes LLE Weight Bearing: Weight bearing as tolerated    Mobility  Bed Mobility Overal bed mobility: Needs Assistance Bed Mobility: Supine to Sit;Sit to Supine     Supine to sit: Min assist;Mod assist Sit to supine: Mod assist   General bed mobility comments: LLE management.    Transfers Overall transfer level: Needs assistance Equipment used: Rolling walker (2 wheeled) Transfers: Sit to/from Stand Sit to Stand: Min assist            Ambulation/Gait Ambulation/Gait assistance: Mod assist Gait Distance (Feet): 3 Feet Assistive device: Rolling walker (2 wheeled) Gait Pattern/deviations: Step-to pattern;Decreased step length - right;Decreased step length - left;Decreased stance time - left Gait velocity: decreased   General Gait Details: very small steps to transition to chair.  cues to keep hands on walker as she reaches too quickly for bed   Stairs             Wheelchair Mobility    Modified Rankin (Stroke Patients Only)       Balance Overall balance assessment: Needs assistance;History of Falls Sitting-balance support: Feet supported;No upper extremity supported Sitting balance-Leahy  Scale: Good     Standing balance support: Bilateral upper extremity supported;During functional activity Standing balance-Leahy Scale: Poor Standing balance comment: Pt unable to shift weight at this time.                            Cognition Arousal/Alertness: Awake/alert Behavior During Therapy: WFL for tasks assessed/performed;Flat affect Overall Cognitive Status: History of cognitive impairments - at baseline                                 General Comments: more conversive this session      Exercises General Exercises - Lower Extremity Ankle Circles/Pumps: AROM;10 reps (Pt educated to perform ankle pumps AMAP every hour) Heel Slides: AAROM;10 reps;Supine;Left Hip ABduction/ADduction: AAROM;Supine;Left;10 reps (8 reps)    General Comments        Pertinent Vitals/Pain Pain Assessment: Faces Faces Pain Scale: Hurts little more Pain Location: unable to state location of discomfort - assumes L hip Pain Descriptors / Indicators: Grimacing;Sore Pain Intervention(s): Limited activity within patient's tolerance;Monitored during session;Premedicated before session;Repositioned    Home Living                      Prior Function            PT Goals (current goals can now be found in the care plan section) Progress towards PT goals: Progressing toward goals  Frequency    BID      PT Plan Current plan remains appropriate    Co-evaluation              AM-PAC PT "6 Clicks" Mobility   Outcome Measure  Help needed turning from your back to your side while in a flat bed without using bedrails?: A Little Help needed moving from lying on your back to sitting on the side of a flat bed without using bedrails?: A Little Help needed moving to and from a bed to a chair (including a wheelchair)?: A Lot Help needed standing up from a chair using your arms (e.g., wheelchair or bedside chair)?: A Little Help needed to walk in hospital  room?: A Lot Help needed climbing 3-5 steps with a railing? : Total 6 Click Score: 14    End of Session   Activity Tolerance: Patient tolerated treatment well;Patient limited by pain Patient left: in bed;with bed alarm set;with call bell/phone within reach Nurse Communication: Mobility status PT Visit Diagnosis: Unsteadiness on feet (R26.81);Difficulty in walking, not elsewhere classified (R26.2);Muscle weakness (generalized) (M62.81);Pain Pain - Right/Left: Left Pain - part of body: Hip     Time: 1100-1111 PT Time Calculation (min) (ACUTE ONLY): 11 min  Charges:  $Therapeutic Activity: 8-22 mins                    Danielle Dess, PTA 06/13/21, 12:48 PM 06/13/2021, 12:46 PM

## 2021-06-13 NOTE — TOC Progression Note (Signed)
Transition of Care [facility]) - Progression Note    Patient Details  Name: Tina Bowman MRN: [id number] Date of Birth: 1946-05-27  [facility]) CM/SW Contact  Barrie Dunker, RN Phone Number: 06/13/2021, 1:43 PM  Clinical Narrative:     Husband decided to go with [facility], Tina at [facility] is aware       Expected Discharge Plan and Services                                                 Social Determinants of Health (SDOH) Interventions    Readmission Risk Interventions No flowsheet data found.

## 2021-06-13 NOTE — Progress Notes (Signed)
Initial Nutrition Assessment  DOCUMENTATION CODES:  Not applicable  INTERVENTION:  Continue current diet as ordered, encourage PO intake Ensure Max po BID, each supplement provides 150 kcal and 30 grams of protein.  MVI with minerals daily  NUTRITION DIAGNOSIS:  Increased nutrient needs related to hip fracture as evidenced by estimated needs.  GOAL:  Patient will meet greater than or equal to 90% of their needs  MONITOR:  PO intake, Supplement acceptance, Skin  REASON FOR ASSESSMENT:  Consult Hip fracture protocol  ASSESSMENT:  75 y.o. female with COPD, HTN, and Parkinson's disease, presented to ED with hip pain after a fall at home. Imaging in ED showed intertrochanteric fracture of the left hip. Taken to OR for surgical repair 8/20.  Pt resting in bed at the time of visit, no family at bedside. Pt awake and alert, but answered most nutrition questions with "yeah" even when answer was not an appropriate response. Did deny GI distress today.   Discussed intake with RN, reports pt ate well this AM. K noted to be low this AM, being replaced. Will add nutrition supplements to encourage wound healing and MVI. No physical signs of malnutrition at this time.  Average Meal Intake: 8/20-8/22: 50% intake x 1 recorded meal  Nutritionally Relevant Medications: Scheduled Meds:  atorvastatin  40 mg Oral QPM   docusate sodium  100 mg Oral BID   pantoprazole  40 mg Oral Daily   Continuous Infusions:  0.9 % NaCl with KCl 20 mEq / L 75 mL/hr at 06/13/21 0913   PRN Meds: bisacodyl, diphenhydrAMINE, magnesium hydroxide, metoCLOPramide, ondansetron, senna-docusate, sodium phosphate  Labs Reviewed: K 3.3  NUTRITION - FOCUSED PHYSICAL EXAM: Flowsheet Row Most Recent Value  Orbital Region Mild depletion  Upper Arm Region No depletion  Thoracic and Lumbar Region No depletion  Buccal Region Mild depletion  Temple Region No depletion  Clavicle Bone Region No depletion  Clavicle and  Acromion Bone Region No depletion  Scapular Bone Region No depletion  Dorsal Hand No depletion  Patellar Region No depletion  Anterior Thigh Region No depletion  Posterior Calf Region No depletion  Edema (RD Assessment) Moderate  [BLE]  Hair Reviewed  Eyes Reviewed  Mouth Reviewed  Skin Reviewed  Nails Reviewed   Diet Order:   Diet Order             Diet Heart Room service appropriate? Yes; Fluid consistency: Thin  Diet effective now                   EDUCATION NEEDS:  No education needs have been identified at this time  Skin:  Skin Assessment: Skin Integrity Issues: Skin Integrity Issues:: Incisions Incisions: left hip  Last BM:  8/21 - type 3  Height:  Ht Readings from Last 1 Encounters:  06/11/21 5\' 2"  (1.575 m)    Weight:  Wt Readings from Last 1 Encounters:  06/13/21 76.3 kg    Ideal Body Weight:  50 kg  BMI:  Body mass index is 30.77 kg/m.  Estimated Nutritional Needs:  Kcal:  1500-1700 kcal/d Protein:  75-85 g/d Fluid:  1.6-1.8L/d   06/15/21, RD, LDN Clinical Dietitian Pager on Amion

## 2021-06-13 NOTE — Progress Notes (Signed)
Physical Therapy Treatment Patient Details Name: Tina Bowman MRN: [id number] DOB: May 27, 1946 Today's Date: 06/13/2021    History of Present Illness 75 yo femal that presents with L hip fx, now s/p IMN. She has PMHx significant for Parkinson's disease, early dementia, sleep apnea, HTN, COPD, CAD, and fibromyalgia.    PT Comments    Premedicated for session.  Ready to get up for breakfast which had arrived.  Participated in exercises as described below.  To EOB with min/mod a x 1.  Steady in sitting.  Stood to walker with min a x 1.  She hesitantly with small shuffling steps is able to transfer to recliner at bedside.  She does need cues for hand placements.  L hand has some trouble gripping walker and will reach with R for armrest of recliner before fully turned.  Breakfast set up.    Nursing reported difficulty yesterday getting pt back to bed with +2 assist.  As he fatigues, assistance level may increase.   Follow Up Recommendations  SNF     Equipment Recommendations       Recommendations for Other Services       Precautions / Restrictions Precautions Precautions: Fall Restrictions Weight Bearing Restrictions: Yes LLE Weight Bearing: Weight bearing as tolerated    Mobility  Bed Mobility Overal bed mobility: Needs Assistance Bed Mobility: Supine to Sit     Supine to sit: Min assist;Mod assist     General bed mobility comments: LLE management.    Transfers Overall transfer level: Needs assistance Equipment used: Rolling walker (2 wheeled) Transfers: Sit to/from Stand Sit to Stand: Min assist            Ambulation/Gait Ambulation/Gait assistance: Mod assist Gait Distance (Feet): 2 Feet Assistive device: Rolling walker (2 wheeled) Gait Pattern/deviations: Step-to pattern;Decreased step length - right;Decreased step length - left;Decreased stance time - left Gait velocity: decreased   General Gait Details: very small steps to transition to chair.  cues  to keep hands on walker as she reaches too quickly for recliner arms   Stairs             Wheelchair Mobility    Modified Rankin (Stroke Patients Only)       Balance Overall balance assessment: Needs assistance;History of Falls Sitting-balance support: Feet supported;No upper extremity supported Sitting balance-Leahy Scale: Good     Standing balance support: Bilateral upper extremity supported;During functional activity Standing balance-Leahy Scale: Poor Standing balance comment: Pt unable to shift weight at this time.                            Cognition Arousal/Alertness: Awake/alert Behavior During Therapy: WFL for tasks assessed/performed;Flat affect Overall Cognitive Status: History of cognitive impairments - at baseline                                        Exercises General Exercises - Lower Extremity Ankle Circles/Pumps: AROM;10 reps (Pt educated to perform ankle pumps AMAP every hour) Heel Slides: AAROM;10 reps;Supine;Left Hip ABduction/ADduction: AAROM;Supine;Left;10 reps (8 reps)    General Comments        Pertinent Vitals/Pain Pain Assessment: Faces Faces Pain Scale: Hurts little more Pain Location: unable to state location of discomfort Pain Descriptors / Indicators: Grimacing;Sore Pain Intervention(s): Limited activity within patient's tolerance;Monitored during session;Premedicated before session;Repositioned    Home Living  Prior Function            PT Goals (current goals can now be found in the care plan section) Progress towards PT goals: Progressing toward goals    Frequency    BID      PT Plan Current plan remains appropriate    Co-evaluation              AM-PAC PT "6 Clicks" Mobility   Outcome Measure  Help needed turning from your back to your side while in a flat bed without using bedrails?: A Little Help needed moving from lying on your back to sitting  on the side of a flat bed without using bedrails?: A Little Help needed moving to and from a bed to a chair (including a wheelchair)?: A Lot Help needed standing up from a chair using your arms (e.g., wheelchair or bedside chair)?: A Little Help needed to walk in hospital room?: A Lot Help needed climbing 3-5 steps with a railing? : Total 6 Click Score: 14    End of Session   Activity Tolerance: Patient tolerated treatment well;Patient limited by pain Patient left: in bed;with bed alarm set;with call bell/phone within reach Nurse Communication: Mobility status PT Visit Diagnosis: Unsteadiness on feet (R26.81);Difficulty in walking, not elsewhere classified (R26.2);Muscle weakness (generalized) (M62.81);Pain Pain - Right/Left: Left Pain - part of body: Hip     Time: 6754-4920 PT Time Calculation (min) (ACUTE ONLY): 16 min  Charges:  $Therapeutic Activity: 8-22 mins                    Danielle Dess, PTA 06/13/21, 9:51 AM , 9:48 AM

## 2021-06-13 NOTE — Progress Notes (Signed)
PROGRESS NOTE    Tina Bowman  WUJ:[id number] DOB: Apr 01, 1946 DOA: 06/11/2021 PCP: Barbette Reichmann, MD    Brief Narrative:  Tina Bowman is a 75 y.o. female with history of Parkinson's disease, asthma, hypertension, HCC, slurred speech and status multiple falls presents complaining of hip pain.  Patient went to the bathroom and as she was getting up she stated her underwear got caught on her feet and she had a mechanical fall.  Apparently was witnessed by husband.  She normally uses her rolling walker.  She was complaining of left hip pain.  EMS was called and patient was brought in.  Other than today she had fallen 10 days ago and 3 weeks ago per husband.  Per husband history of multiple falls due to her Parkinson's  8/21 status post IM nail intertrochanteric done by Dr. Joice Lofts on 8/20 8/22-Hg 7.2 this am. Spoke to pt about transfusion and its complications, she is agreeable to proceed. Tried calling husband to update multiple times but just went to his VM  Consultants:  Orthopedics  Procedures:   Antimicrobials:      Subjective: No sob, cp , dizziness  Objective: Vitals:   06/12/21 1633 06/12/21 1935 06/13/21 0545 06/13/21 0809  BP: (!) 121/54 115/85 (!) 131/55 (!) 125/54  Pulse: (!) 107 (!) 109 (!) 101 98  Resp: Temp: 98.9 F (37.2 C) 98.6 F (37 C) 98.7 F (37.1 C) 98.1 F (36.7 C)  TempSrc: Oral Oral    SpO2: 98% 98% 97% 95%  Weight:      Height:        Intake/Output Summary (Last 24 hours) at 06/13/2021 0838 Last data filed at 06/12/2021 1759 Gross per 24 hour  Intake 35.42 ml  Output 1000 ml  Net -964.58 ml   Filed Weights   06/11/21 0843  Weight: 70.3 kg    Examination: NAD, calm CTA no wheeze rales rhonchi's Regular S1-S2 no gallops Soft benign positive bowel sounds No edema Awake and alert, mood and affect appropriate in current setting   Data Reviewed: I have personally reviewed following labs and imaging  studies  CBC: Recent Labs  Lab 06/11/21 0908 06/12/21 0505 06/13/21 0617  WBC 9.6 14.2* 9.1  NEUTROABS 7.6  --   --   HGB 11.4* 8.8* 7.2*  HCT 33.9* 26.8* 22.2*  MCV 98.5 99.6 98.7  PLT 188 167 134*   Basic Metabolic Panel: Recent Labs  Lab 06/11/21 0908 06/12/21 0505 06/13/21 0617  NA 141 139  --   K 3.2* 3.3* 3.3*  CL 105 105  --   CO2 24 27  --   GLUCOSE 139* 138*  --   BUN 23 21  --   CREATININE 0.63 0.54  --   CALCIUM 8.7* 8.3*  --    GFR: Estimated Creatinine Clearance: 55.8 mL/min (by C-G formula based on SCr of 0.54 mg/dL). Liver Function Tests: No results for input(s): AST, ALT, ALKPHOS, BILITOT, PROT, ALBUMIN in the last 168 hours. No results for input(s): LIPASE, AMYLASE in the last 168 hours. No results for input(s): AMMONIA in the last 168 hours. Coagulation Profile: Recent Labs  Lab 06/11/21 0908  INR 1.0   Cardiac Enzymes: No results for input(s): CKTOTAL, CKMB, CKMBINDEX, TROPONINI in the last 168 hours. BNP (last 3 results) No results for input(s): PROBNP in the last 8760 hours. HbA1C: No results for input(s): HGBA1C in the last 72 hours. CBG: No results for input(s): GLUCAP in the  last 168 hours. Lipid Profile: No results for input(s): CHOL, HDL, LDLCALC, TRIG, CHOLHDL, LDLDIRECT in the last 72 hours. Thyroid Function Tests: No results for input(s): TSH, T4TOTAL, FREET4, T3FREE, THYROIDAB in the last 72 hours. Anemia Panel: No results for input(s): VITAMINB12, FOLATE, FERRITIN, TIBC, IRON, RETICCTPCT in the last 72 hours. Sepsis Labs: No results for input(s): PROCALCITON, LATICACIDVEN in the last 168 hours.  Recent Results (from the past 240 hour(s))  Resp Panel by RT-PCR (Flu A&B, Covid) Nasopharyngeal Swab     Status: None   Collection Time: 06/11/21  9:38 AM   Specimen: Nasopharyngeal Swab; Nasopharyngeal(NP) swabs in vial transport medium  Result Value Ref Range Status   SARS Coronavirus 2 by RT PCR NEGATIVE NEGATIVE Final     Comment: (NOTE) SARS-CoV-2 target nucleic acids are NOT DETECTED.  The SARS-CoV-2 RNA is generally detectable in upper respiratory specimens during the acute phase of infection. The lowest concentration of SARS-CoV-2 viral copies this assay can detect is 138 copies/mL. A negative result does not preclude SARS-Cov-2 infection and should not be used as the sole basis for treatment or other patient management decisions. A negative result may occur with  improper specimen collection/handling, submission of specimen other than nasopharyngeal swab, presence of viral mutation(s) within the areas targeted by this assay, and inadequate number of viral copies(<138 copies/mL). A negative result must be combined with clinical observations, patient history, and epidemiological information. The expected result is Negative.  Fact Sheet for Patients:  BloggerCourse.com  Fact Sheet for Healthcare Providers:  SeriousBroker.it  This test is no t yet approved or cleared by the [place] FDA and  has been authorized for detection and/or diagnosis of SARS-CoV-2 by FDA under an Emergency Use Authorization (EUA). This EUA will remain  in effect (meaning this test can be used) for the duration of the COVID-19 declaration under Section 564(b)(1) of the Act, 21 U.S.C.section 360bbb-3(b)(1), unless the authorization is terminated  or revoked sooner.       Influenza A by PCR NEGATIVE NEGATIVE Final   Influenza B by PCR NEGATIVE NEGATIVE Final    Comment: (NOTE) The Xpert Xpress SARS-CoV-2/FLU/RSV plus assay is intended as an aid in the diagnosis of influenza from Nasopharyngeal swab specimens and should not be used as a sole basis for treatment. Nasal washings and aspirates are unacceptable for Xpert Xpress SARS-CoV-2/FLU/RSV testing.  Fact Sheet for Patients: BloggerCourse.com  Fact Sheet for Healthcare  Providers: SeriousBroker.it  This test is not yet approved or cleared by the [place] FDA and has been authorized for detection and/or diagnosis of SARS-CoV-2 by FDA under an Emergency Use Authorization (EUA). This EUA will remain in effect (meaning this test can be used) for the duration of the COVID-19 declaration under Section 564(b)(1) of the Act, 21 U.S.C. section 360bbb-3(b)(1), unless the authorization is terminated or revoked.  Performed at [facility], [address], [place], [place] [address]          Radiology Studies: CT HEAD WO CONTRAST ( )  Result Date: 06/11/2021 CLINICAL DATA:  Unwitnessed fall while going to the bathroom. EXAM: CT HEAD WITHOUT CONTRAST CT CERVICAL SPINE WITHOUT CONTRAST TECHNIQUE: Multidetector CT imaging of the head and cervical spine was performed following the standard protocol without intravenous contrast. Multiplanar CT image reconstructions of the cervical spine were also generated. COMPARISON:  None. FINDINGS: CT HEAD FINDINGS Brain: No evidence of acute infarction, hemorrhage, hydrocephalus, extra-axial collection or mass lesion/mass effect. Vascular: There is minimal atherosclerotic calcification of the internal  carotid arteries. No hyperdense vessels. Skull: Normal. Negative for fracture or focal lesion. Sinuses/Orbits: Ethmoid air cells are normal. Partially imaged LEFT maxillary sinus air-fluid level. Mastoid air cells are normal. Other: None CT CERVICAL SPINE FINDINGS Alignment: There is reversal of the cervical lordosis in the mid cervical levels, related to degenerative changes. There is 2 millimeters anterolisthesis of C5 on C6. Skull base and vertebrae: No acute fracture. No primary bone lesion or focal pathologic process. Soft tissues and spinal canal: No prevertebral fluid or swelling. No visible canal hematoma. Disc levels: Significant disc height loss and uncovertebral spurring at C5-6, C6-7, and  C7-T1. Upper chest: Negative. Other: Note is made of air-fluid level within the LEFT maxillary sinus, associated with a minimally displaced fracture of the LATERAL wall. Remote ORIF of the RIGHT clavicle. IMPRESSION: 1.  No evidence for acute intracranial abnormality. 2. Moderate mid cervical degenerative changes without acute fracture. 3. Fracture of the LATERAL wall of the LEFT maxillary sinus, associated with air-fluid level sinus. Electronically Signed   By: Norva Pavlov M.D.   On: 06/11/2021 10:07   CT Cervical Spine Wo Contrast  Result Date: 06/11/2021 CLINICAL DATA:  Unwitnessed fall while going to the bathroom. EXAM: CT HEAD WITHOUT CONTRAST CT CERVICAL SPINE WITHOUT CONTRAST TECHNIQUE: Multidetector CT imaging of the head and cervical spine was performed following the standard protocol without intravenous contrast. Multiplanar CT image reconstructions of the cervical spine were also generated. COMPARISON:  None. FINDINGS: CT HEAD FINDINGS Brain: No evidence of acute infarction, hemorrhage, hydrocephalus, extra-axial collection or mass lesion/mass effect. Vascular: There is minimal atherosclerotic calcification of the internal carotid arteries. No hyperdense vessels. Skull: Normal. Negative for fracture or focal lesion. Sinuses/Orbits: Ethmoid air cells are normal. Partially imaged LEFT maxillary sinus air-fluid level. Mastoid air cells are normal. Other: None CT CERVICAL SPINE FINDINGS Alignment: There is reversal of the cervical lordosis in the mid cervical levels, related to degenerative changes. There is 2 millimeters anterolisthesis of C5 on C6. Skull base and vertebrae: No acute fracture. No primary bone lesion or focal pathologic process. Soft tissues and spinal canal: No prevertebral fluid or swelling. No visible canal hematoma. Disc levels: Significant disc height loss and uncovertebral spurring at C5-6, C6-7, and C7-T1. Upper chest: Negative. Other: Note is made of air-fluid level within  the LEFT maxillary sinus, associated with a minimally displaced fracture of the LATERAL wall. Remote ORIF of the RIGHT clavicle. IMPRESSION: 1.  No evidence for acute intracranial abnormality. 2. Moderate mid cervical degenerative changes without acute fracture. 3. Fracture of the LATERAL wall of the LEFT maxillary sinus, associated with air-fluid level sinus. Electronically Signed   By: Norva Pavlov M.D.   On: 06/11/2021 10:07   DG Chest Portable 1 View  Result Date: 06/11/2021 CLINICAL DATA:  Preoperative examination. EXAM: PORTABLE CHEST 1 VIEW COMPARISON:  09/12/2017 FINDINGS: Grossly unchanged cardiac silhouette given decreased lung volumes and supine technique. Atherosclerotic plaque when the thoracic aorta. Lungs appear hyperexpanded with mild diffuse slightly nodular thickening the pulmonary interstitium. No discrete focal airspace opacities. No pleural effusion or pneumothorax. No evidence of edema. No definite acute osseous abnormalities. Spinal stimulator leads overlie the midthoracic spine. Post ORIF of the right clavicle, incompletely imaged. IMPRESSION: No acute cardiopulmonary disease on this AP supine portable examination. Electronically Signed   By: Simonne Come M.D.   On: 06/11/2021 09:40   DG C-Arm 1-60 Min  Result Date: 06/11/2021 CLINICAL DATA:  LEFT femoral IM nail. EXAM: DG C-ARM 1-60 MIN; LEFT FEMUR  2 VIEWS FLUOROSCOPY TIME:  Fluoroscopy Time:  41 seconds Number of Acquired Spot Images: 18 COMPARISON:  06/11/2021 FINDINGS: Images are performed during the placement of intramedullary nail with proximal and distal fixation in the LEFT femur. IMPRESSION: ORIF of intertrochanteric fracture of the LEFT femur. Electronically Signed   By: Norva PavlovElizabeth  Brown M.D.   On: 06/11/2021 13:18   DG Hip Unilat W or Wo Pelvis 2-3 Views Left  Result Date: 06/11/2021 CLINICAL DATA:  Unwitnessed fall in the bathroom. EXAM: DG HIP (WITH OR WITHOUT PELVIS) 2-3V LEFT COMPARISON:  None. FINDINGS: There is  an acute, horizontal, displaced fracture involving the intertrochanteric region of the femur with foreshortening and anteromedial displacement of the distal fracture fragment. No definitive intra-articular extension. Expected adjacent soft tissue swelling. No radiopaque foreign body No additional fractures identified. Spinal stimulator device overlies the medial aspect the left buttocks. IMPRESSION: Acute, horizontal, displaced left intertrochanteric femur fracture with foreshortening and displacement. Electronically Signed   By: Simonne ComeJohn  Watts M.D.   On: 06/11/2021 09:38   DG FEMUR MIN 2 VIEWS LEFT  Result Date: 06/11/2021 CLINICAL DATA:  LEFT femoral IM nail. EXAM: DG C-ARM 1-60 MIN; LEFT FEMUR 2 VIEWS FLUOROSCOPY TIME:  Fluoroscopy Time:  41 seconds Number of Acquired Spot Images: 18 COMPARISON:  06/11/2021 FINDINGS: Images are performed during the placement of intramedullary nail with proximal and distal fixation in the LEFT femur. IMPRESSION: ORIF of intertrochanteric fracture of the LEFT femur. Electronically Signed   By: Norva PavlovElizabeth  Brown M.D.   On: 06/11/2021 13:18        Scheduled Meds:  atorvastatin  40 mg Oral QPM   carbidopa-levodopa  2 tablet Oral TID   docusate sodium  100 mg Oral BID   enoxaparin (LOVENOX) injection  40 mg Subcutaneous Q24H   pantoprazole  40 mg Oral Daily   traMADol  50 mg Oral Q6H   Continuous Infusions:  sodium chloride 250 mL (06/12/21 0626)   0.9 % NaCl with KCl 20 mEq / L Stopped (06/12/21 0048)    Assessment & Plan:   Active Problems:   Hip fracture (HCC)   1.Status post mechanical fall  Left hip fracture: History of multiple falls per husband due to Parkinson's 8/22 Ortho following Continue bowel regimen Status post reduction/internal fixation of displaced intertrochanteric left hip fracture with no Pain management PT recommends SNF Lovenox, foot pumps, TED hose for DVT prophylaxis Weightbearing as tolerated to left leg  2.Acute blood loss-  post surgery Hg 7.2.  Hold ivf Would like to also discuss with husband about transfusion but unable to reach him.  Will ck another h/h , if low will need to transfuse 7 or less.    3.  Hypertension- Stable  BP meds held prior to going to surgery we will continue to hold as she is normotensive and monitor      4. DL-continue statins   4. Parkinsons-restart her Sinemet  DVT prophylaxis: Lovenox Code Status: Full Family Communication: None at bedside Disposition Plan:  Status is: Inpatient  Remains inpatient appropriate because:Inpatient level of care appropriate due to severity of illness  Dispo: The patient is from: Home              Anticipated d/c is to: SNF              Patient currently is not medically stable to d/c.   Difficult to place patient No    SNF pending        LOS: 2 days  Time spent: 35 minutes with more than 50% on COC    Lynn Ito, MD [facility] Pager [phone]  If 7PM-7AM, please contact night-coverage 06/13/2021, 8:38 AM

## 2021-06-13 NOTE — TOC Progression Note (Signed)
Transition of Care [facility]) - Progression Note    Patient Details  Name: Tina Bowman MRN: [id number] Date of Birth: 10/23/1946  [facility] of [facility]) CM/SW Contact  Barrie Dunker, RN Phone Number: 06/13/2021, 10:50 AM  Clinical Narrative:    Spoke to the patient's husband [name] on the phone, he has spoken with Inetta Fermo at [facility] and is headed there to tour the facility, he will give me a call afterwards and let me know if he accepts the bed offer, Provided my contact inforamtion        Expected Discharge Plan and Services                                                 Social Determinants of Health (SDOH) Interventions    Readmission Risk Interventions No flowsheet data found.

## 2021-06-13 NOTE — Plan of Care (Signed)
No acute events during the night. VSS. CPAP worn during the night. PRN pain med administered x 1. Safety maintained.  Problem: Health Behavior/Discharge Planning: Goal: Ability to manage health-related needs will improve Outcome: Progressing   Problem: Clinical Measurements: Goal: Ability to maintain clinical measurements within normal limits will improve Outcome: Progressing Goal: Will remain free from infection Outcome: Progressing Goal: Respiratory complications will improve Outcome: Progressing   Problem: Activity: Goal: Risk for activity intolerance will decrease Outcome: Progressing   Problem: Nutrition: Goal: Adequate nutrition will be maintained Outcome: Progressing   Problem: Coping: Goal: Level of anxiety will decrease Outcome: Progressing   Problem: Elimination: Goal: Will not experience complications related to bowel motility Outcome: Progressing Goal: Will not experience complications related to urinary retention Outcome: Progressing   Problem: Safety: Goal: Ability to remain free from injury will improve Outcome: Progressing   Problem: Education: Goal: Knowledge of the prescribed therapeutic regimen will improve Outcome: Progressing Goal: Understanding of discharge needs will improve Outcome: Progressing   Problem: Activity: Goal: Ability to avoid complications of mobility impairment will improve Outcome: Progressing Goal: Ability to tolerate increased activity will improve Outcome: Progressing   Problem: Clinical Measurements: Goal: Postoperative complications will be avoided or minimized Outcome: Progressing   Problem: Pain Management: Goal: Pain level will decrease with appropriate interventions Outcome: Progressing   Problem: Skin Integrity: Goal: Will show signs of wound healing Outcome: Progressing

## 2021-06-13 NOTE — Progress Notes (Signed)
Subjective: 2 Days Post-Op Procedure(s) (LRB): INTRAMEDULLARY (IM) NAIL INTERTROCHANTRIC (Left) Patient reports pain as mild.   Patient is well this AM, husband in the room.  State she was really out of it yesterday while on the pain medicine. Plan is to go Rehab after hospital stay. Negative for chest pain and shortness of breath Fever: no Gastrointestinal:Negative for nausea and vomiting  Objective: Vital signs in last 24 hours: Temp:  [98 F (36.7 C)-98.9 F (37.2 C)] 98.7 F (37.1 C) (08/22 0545) Pulse Rate:  [101-109] 101 (08/22 0545) Resp:  [18-19] 19 (08/22 0545) BP: (115-131)/(53-85) 131/55 (08/22 0545) SpO2:  [97 %-99 %] 97 % (08/22 0545)  Intake/Output from previous day:  Intake/Output Summary (Last 24 hours) at 06/13/2021 0805 Last data filed at 06/12/2021 1759 Gross per 24 hour  Intake 35.42 ml  Output 1000 ml  Net -964.58 ml    Intake/Output this shift: No intake/output data recorded.  Labs: Recent Labs    06/11/21 0908 06/12/21 0505  HGB 11.4* 8.8*   Recent Labs    06/11/21 0908 06/12/21 0505  WBC 9.6 14.2*  RBC 3.44* 2.69*  HCT 33.9* 26.8*  PLT 188 167   Recent Labs    06/11/21 0908 06/12/21 0505 06/13/21 0617  NA 141 139  --   K 3.2* 3.3* 3.3*  CL 105 105  --   CO2 24 27  --   BUN 23 21  --   CREATININE 0.63 0.54  --   GLUCOSE 139* 138*  --   CALCIUM 8.7* 8.3*  --    Recent Labs    06/11/21 0908  INR 1.0   EXAM General - Patient is Alert and Confused Extremity - Neurovascular intact Sensation intact distally Dorsiflexion/Plantar flexion intact Compartment soft Dressing/Incision - clean, dry, blood tinged and scant  drainage Motor Function - intact, moving foot and toes well on exam.  Abdomen soft this morning.  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 disorders clinic, and replaced with Atypical Parkinsonism..   Sleep apnea    Slurred speech    foggy minded    Assessment/Plan: 2 Days Post-Op Procedure(s) (LRB): INTRAMEDULLARY (IM) NAIL INTERTROCHANTRIC (Left) Active Problems:   Hip fracture (HCC)  Estimated body mass index is 28.35 kg/m as calculated from the following:   Height as of this encounter: 5\' 2"  (1.575 m).   Weight as of this encounter: 70.3 kg. Advance diet Up with therapy  Labs reviewed this AM. K+ 3,3 will supplement.   Patient with altered mental status yesterday, will hold narcotics as much as possible today. Tylenol 1000mg  every 6 hours for pain. Up with therapy today. Will likely need SNF upon discharge. Patient is passing gas and reports that she did have a BM.  DVT Prophylaxis - Lovenox, Foot Pumps, and TED hose Weight-Bearing as tolerated to Left leg  J. , PA-C Orthopaedic Surgery 06/13/2021, 8:05 AM

## 2021-06-14 DIAGNOSIS — S72002E Fracture of unspecified part of neck of left femur, subsequent encounter for open fracture type I or II with routine healing: Secondary | ICD-10-CM | POA: Diagnosis not present

## 2021-06-14 DIAGNOSIS — E785 Hyperlipidemia, unspecified: Secondary | ICD-10-CM | POA: Diagnosis not present

## 2021-06-14 DIAGNOSIS — D62 Acute posthemorrhagic anemia: Secondary | ICD-10-CM | POA: Diagnosis not present

## 2021-06-14 DIAGNOSIS — G20 Parkinson's disease: Secondary | ICD-10-CM | POA: Diagnosis not present

## 2021-06-14 LAB — CBC
HCT: 23 % — ABNORMAL LOW (ref 36.0–46.0)
Hemoglobin: 7.4 g/dL — ABNORMAL LOW (ref 12.0–15.0)
MCH: 31.8 pg (ref 26.0–34.0)
MCHC: 32.2 g/dL (ref 30.0–36.0)
MCV: 98.7 fL (ref 80.0–100.0)
Platelets: 148 10*3/uL — ABNORMAL LOW (ref 150–400)
RBC: 2.33 MIL/uL — ABNORMAL LOW (ref 3.87–5.11)
RDW: 12.8 % (ref 11.5–15.5)
WBC: 9.7 10*3/uL (ref 4.0–10.5)
nRBC: 0 % (ref 0.0–0.2)

## 2021-06-14 NOTE — Progress Notes (Signed)
Physical Therapy Treatment Patient Details Name: Tina Bowman MRN: [id number] DOB: 1946-09-25 Today's Date: 06/14/2021    History of Present Illness 75 yo femal that presents with L hip fx, now s/p IMN. She has PMHx significant for Parkinson's disease, early dementia, sleep apnea, HTN, COPD, CAD, and fibromyalgia.    PT Comments    Author returned for 2nd/PM session. Pt is still sitting in recliner upon arriving but reports she is wanting to get back into bed. Author assisted pt back to bed with use of RW. She was able to advance to take ~ 3 steps to pivot/step to EOB. Mod assist to wt shift to allow opposite LE advancement. Once returned to bed, pt was issued HEP handout. She performed with vcs and assistance. Overall pt is progressing towards goals. Will need extensive therapy going forward to return to PLOF. Acute PT will continue to follow and progress as able per current POC.    Follow Up Recommendations  SNF      Equipment Recommendations  Other (comment) (defer to next level of care)       Precautions / Restrictions Precautions Precautions: Fall Restrictions Weight Bearing Restrictions: Yes LLE Weight Bearing: Weight bearing as tolerated    Mobility  Bed Mobility Overal bed mobility: Needs Assistance Bed Mobility: Sit to Supine       Sit to supine: Mod assist   General bed mobility comments: increased assistance required to return to bed from short sit EOB    Transfers Overall transfer level: Needs assistance Equipment used: Rolling walker (2 wheeled) Transfers: Sit to/from Stand   Stand pivot transfers: Mod assist       General transfer comment: pt was able to stand from lower recliner height and pivot towards L to EOB. pt did clear floor to take ~ 2 steps while pivoting back to bed. increased time and vcs throughout  Ambulation/Gait Ambulation/Gait assistance: Mod assist Gait Distance (Feet): 3 Feet Assistive device: Rolling walker (2 wheeled) Gait  Pattern/deviations: Step-to pattern Gait velocity: decreased   General Gait Details: with assistance with lateral wt shifting, pt was able to clear RLE form floor with increased time and vcs throughout for sequencing    Balance Overall balance assessment: Needs assistance;History of Falls Sitting-balance support: Feet supported;No upper extremity supported Sitting balance-Leahy Scale: Good     Standing balance support: Bilateral upper extremity supported;During functional activity Standing balance-Leahy Scale: Poor Standing balance comment: Pt unable to shift weight at this time.      Cognition Arousal/Alertness: Awake/alert Behavior During Therapy: Flat affect Overall Cognitive Status: History of cognitive impairments - at baseline      General Comments: pt was more conversational this afternoon versus AM session.      Exercises General Exercises - Lower Extremity Ankle Circles/Pumps: AROM;10 reps Quad Sets: AROM;10 reps Gluteal Sets: AROM;10 reps Heel Slides: AROM;10 reps Hip ABduction/ADduction: AROM;10 reps Straight Leg Raises: AAROM;10 reps        Pertinent Vitals/Pain Pain Assessment: No/denies pain Pain Intervention(s): Limited activity within patient's tolerance;Monitored during session     PT Goals (current goals can now be found in the care plan section) Acute Rehab PT Goals Patient Stated Goal: none stated Progress towards PT goals: Progressing toward goals    Frequency    BID      PT Plan Current plan remains appropriate       AM-PAC PT "6 Clicks" Mobility   Outcome Measure  Help needed turning from your back to your side while in  a flat bed without using bedrails?: A Little Help needed moving from lying on your back to sitting on the side of a flat bed without using bedrails?: A Little Help needed moving to and from a bed to a chair (including a wheelchair)?: A Little Help needed standing up from a chair using your arms (e.g., wheelchair or  bedside chair)?: A Lot Help needed to walk in hospital room?: A Lot Help needed climbing 3-5 steps with a railing? : Total 6 Click Score: 14    End of Session Equipment Utilized During Treatment: Gait belt Activity Tolerance: Patient tolerated treatment well Patient left: in bed;with call bell/phone within reach;with bed alarm set Nurse Communication: Mobility status PT Visit Diagnosis: Unsteadiness on feet (R26.81);Difficulty in walking, not elsewhere classified (R26.2);Muscle weakness (generalized) (M62.81);Pain Pain - Right/Left: Left Pain - part of body: Hip     Time: 4239-5320 PT Time Calculation (min) (ACUTE ONLY): 23 min  Charges:  $Therapeutic Exercise: 8-22 mins $Therapeutic Activity: 8-22 mins                     Jetta Lout PTA 06/14/21, 3:11 PM

## 2021-06-14 NOTE — Progress Notes (Signed)
Physical Therapy Treatment Patient Details Name: Tina Bowman MRN: [id number] DOB: [birth year] Today's Date: 06/14/2021    History of Present Illness 75 yo femal that presents with L hip fx, now s/p IMN. She has PMHx significant for Parkinson's disease, early dementia, sleep apnea, HTN, COPD, CAD, and fibromyalgia.    PT Comments    Pt was long sitting in bed finishing breakfast upon arriving. Poor intake noted. She agrees to PT session and is alert throughout.Pt does has baseline cognition deficits however pt's spouse reports that they are not as severe as she is presenting currently. Pt perseverating at time. Does endorse L LE pain however it did not limit session progression. Able to exit R side of bed with increased time and min assist. Sat EOB x several minutes prior to performing STS 3 x. Pt attempts to take step with RLE however was unable. She has poor lateral wt shift and is limited by pain. Session did progress to stand pivot towards L with mod assist required for safety. She was repositioned in recliner with upon at bedside and call bell in reach. Chair alarm in place. Highly recommend DC to rehab to improve mobility and safety with ADLs.    Follow Up Recommendations  SNF     Equipment Recommendations  Other (comment) (defer to next level of care)       Precautions / Restrictions Precautions Precautions: Fall Restrictions Weight Bearing Restrictions: Yes LLE Weight Bearing: Weight bearing as tolerated    Mobility  Bed Mobility Overal bed mobility: Needs Assistance Bed Mobility: Supine to Sit     Supine to sit: Min assist     General bed mobility comments: pt performed rolling R to short sit with increased time and min assist. sat EOB x ~ 8 minutes performing several exercises prior to standing 3 x. unable to advancve to taking steps however did stand pivot toward L to recliner    Transfers Overall transfer level: Needs assistance Equipment used: Rolling walker  (2 wheeled) Transfers: Sit to/from Stand Sit to Stand: Min assist;From elevated surface Stand pivot transfers: Mod assist;From elevated surface       General transfer comment: pt unable to take steps with RLE due topain with wt on LLE.  Ambulation/Gait      General Gait Details: unable   Balance Overall balance assessment: Needs assistance;History of Falls Sitting-balance support: Feet supported;No upper extremity supported Sitting balance-Leahy Scale: Good     Standing balance support: Bilateral upper extremity supported;During functional activity Standing balance-Leahy Scale: Poor Standing balance comment: Pt unable to shift weight at this time.      Cognition Arousal/Alertness: Awake/alert Behavior During Therapy: Flat affect (perseverating "yeah") Overall Cognitive Status: History of cognitive impairments - at baseline      General Comments: pt does not speak often but answers most questions by saying yeah. Per spouse, not at baseline however has deficits prior to admission             Pertinent Vitals/Pain Pain Assessment: 0-10 Pain Score:  (unable to rate) Faces Pain Scale: Hurts a little bit Pain Location: unable to state location of discomfort - assumes L hip Pain Descriptors / Indicators: Grimacing;Sore Pain Intervention(s): Limited activity within patient's tolerance;Monitored during session;Repositioned     PT Goals (current goals can now be found in the care plan section) Acute Rehab PT Goals Patient Stated Goal: none stated Progress towards PT goals: Progressing toward goals    Frequency    BID  PT Plan Current plan remains appropriate       AM-PAC PT "6 Clicks" Mobility   Outcome Measure  Help needed turning from your back to your side while in a flat bed without using bedrails?: A Little Help needed moving from lying on your back to sitting on the side of a flat bed without using bedrails?: A Little Help needed moving to and from a  bed to a chair (including a wheelchair)?: A Little Help needed standing up from a chair using your arms (e.g., wheelchair or bedside chair)?: A Lot Help needed to walk in hospital room?: Total Help needed climbing 3-5 steps with a railing? : Total 6 Click Score: 13    End of Session Equipment Utilized During Treatment: Gait belt Activity Tolerance: Patient tolerated treatment well Patient left: in chair;with call bell/phone within reach;with chair alarm set;with family/visitor present Nurse Communication: Mobility status PT Visit Diagnosis: Unsteadiness on feet (R26.81);Difficulty in walking, not elsewhere classified (R26.2);Muscle weakness (generalized) (M62.81);Pain Pain - Right/Left: Left Pain - part of body: Hip     Time: 0912-0936 PT Time Calculation (min) (ACUTE ONLY): 24 min  Charges:  $Therapeutic Activity: 23-37 mins                     Jetta Lout PTA 06/14/21, 9:50 AM

## 2021-06-14 NOTE — Progress Notes (Signed)
PROGRESS NOTE    [name]  VOJ:[id number] DOB: January 21, 1946 DOA: 06/11/2021 PCP: Barbette Reichmann, MD    Brief Narrative:  Tina Bowman is a 75 y.o. female with history of Parkinson's disease, asthma, hypertension, HCC, slurred speech and status multiple falls presents complaining of hip pain.  Patient went to the bathroom and as she was getting up she stated her underwear got caught on her feet and she had a mechanical fall.  Apparently was witnessed by husband.  She normally uses her rolling walker.  She was complaining of left hip pain.  EMS was called and patient was brought in.  Other than today she had fallen 10 days ago and 3 weeks ago per husband.  Per husband history of multiple falls due to her Parkinson's  8/21 status post IM nail intertrochanteric done by Dr. Joice Lofts on 8/20 8/23-no overnight issues.  Hemoglobin 7.4.  Consultants:  Orthopedics  Procedures:   Antimicrobials:      Subjective: Denies shortness of breath, nausea chest pain  Objective: Vitals:   06/13/21 1533 06/13/21 1955 06/14/21 0341 06/14/21 0757  BP: (!) 143/43 (!) 130/50 129/61 (!) 131/59  Pulse: (!) 104 100 92 97  Resp: 20 18 17 17   Temp: 99 F (37.2 C) 99 F (37.2 C) 98.7 F (37.1 C) 98.2 F (36.8 C)  TempSrc: Oral     SpO2: 97% 96% 96% 98%  Weight:      Height:        Intake/Output Summary (Last 24 hours) at 06/14/2021 0840 Last data filed at 06/14/2021 0555 Gross per 24 hour  Intake 240 ml  Output 2250 ml  Net -2010 ml   Filed Weights   06/11/21 0843 06/13/21 1100  Weight: 70.3 kg 76.3 kg    Examination: Sitting in chair, NAD husband at bedside CTA no wheeze rales rhonchi's Regular S1-S2 no gallops Soft benign positive bowel sounds No edema, grossly intact   Data Reviewed: I have personally reviewed following labs and imaging studies  CBC: Recent Labs  Lab 06/11/21 0908 06/12/21 0505 06/13/21 0617 06/13/21 1339 06/14/21 0330  WBC 9.6 14.2* 9.1  --  9.7   NEUTROABS 7.6  --   --   --   --   HGB 11.4* 8.8* 7.2* 7.9* 7.4*  HCT 33.9* 26.8* 22.2* 24.1* 23.0*  MCV 98.5 99.6 98.7  --  98.7  PLT 188 167 134*  --  148*   Basic Metabolic Panel: Recent Labs  Lab 06/11/21 0908 06/12/21 0505 06/13/21 0617  NA 141 139  --   K 3.2* 3.3* 3.3*  CL 105 105  --   CO2 24 27  --   GLUCOSE 139* 138*  --   BUN 23 21  --   CREATININE 0.63 0.54  --   CALCIUM 8.7* 8.3*  --    GFR: Estimated Creatinine Clearance: 58.1 mL/min (by C-G formula based on SCr of 0.54 mg/dL). Liver Function Tests: No results for input(s): AST, ALT, ALKPHOS, BILITOT, PROT, ALBUMIN in the last 168 hours. No results for input(s): LIPASE, AMYLASE in the last 168 hours. No results for input(s): AMMONIA in the last 168 hours. Coagulation Profile: Recent Labs  Lab 06/11/21 0908  INR 1.0   Cardiac Enzymes: No results for input(s): CKTOTAL, CKMB, CKMBINDEX, TROPONINI in the last 168 hours. BNP (last 3 results) No results for input(s): PROBNP in the last 8760 hours. HbA1C: No results for input(s): HGBA1C in the last 72 hours. CBG: No results for input(s):  GLUCAP in the last 168 hours. Lipid Profile: No results for input(s): CHOL, HDL, LDLCALC, TRIG, CHOLHDL, LDLDIRECT in the last 72 hours. Thyroid Function Tests: No results for input(s): TSH, T4TOTAL, FREET4, T3FREE, THYROIDAB in the last 72 hours. Anemia Panel: No results for input(s): VITAMINB12, FOLATE, FERRITIN, TIBC, IRON, RETICCTPCT in the last 72 hours. Sepsis Labs: No results for input(s): PROCALCITON, LATICACIDVEN in the last 168 hours.  Recent Results (from the past 240 hour(s))  Resp Panel by RT-PCR (Flu A&B, Covid) Nasopharyngeal Swab     Status: None   Collection Time: 06/11/21  9:38 AM   Specimen: Nasopharyngeal Swab; Nasopharyngeal(NP) swabs in vial transport medium  Result Value Ref Range Status   SARS Coronavirus 2 by RT PCR NEGATIVE NEGATIVE Final    Comment: (NOTE) SARS-CoV-2 target nucleic acids are  NOT DETECTED.  The SARS-CoV-2 RNA is generally detectable in upper respiratory specimens during the acute phase of infection. The lowest concentration of SARS-CoV-2 viral copies this assay can detect is 138 copies/mL. A negative result does not preclude SARS-Cov-2 infection and should not be used as the sole basis for treatment or other patient management decisions. A negative result may occur with  improper specimen collection/handling, submission of specimen other than nasopharyngeal swab, presence of viral mutation(s) within the areas targeted by this assay, and inadequate number of viral copies(<138 copies/mL). A negative result must be combined with clinical observations, patient history, and epidemiological information. The expected result is Negative.  Fact Sheet for Patients:  BloggerCourse.com  Fact Sheet for Healthcare Providers:  SeriousBroker.it  This test is no t yet approved or cleared by the [place] FDA and  has been authorized for detection and/or diagnosis of SARS-CoV-2 by FDA under an Emergency Use Authorization (EUA). This EUA will remain  in effect (meaning this test can be used) for the duration of the COVID-19 declaration under Section 564(b)(1) of the Act, 21 U.S.C.section 360bbb-3(b)(1), unless the authorization is terminated  or revoked sooner.       Influenza A by PCR NEGATIVE NEGATIVE Final   Influenza B by PCR NEGATIVE NEGATIVE Final    Comment: (NOTE) The Xpert Xpress SARS-CoV-2/FLU/RSV plus assay is intended as an aid in the diagnosis of influenza from Nasopharyngeal swab specimens and should not be used as a sole basis for treatment. Nasal washings and aspirates are unacceptable for Xpert Xpress SARS-CoV-2/FLU/RSV testing.  Fact Sheet for Patients: BloggerCourse.com  Fact Sheet for Healthcare Providers: SeriousBroker.it  This test is not  yet approved or cleared by the [place] FDA and has been authorized for detection and/or diagnosis of SARS-CoV-2 by FDA under an Emergency Use Authorization (EUA). This EUA will remain in effect (meaning this test can be used) for the duration of the COVID-19 declaration under Section 564(b)(1) of the Act, 21 U.S.C. section 360bbb-3(b)(1), unless the authorization is terminated or revoked.  Performed at [facility], [address], [place], [place] [address]          Radiology Studies: No results found.      Scheduled Meds:  atorvastatin  40 mg Oral QPM   carbidopa-levodopa  2 tablet Oral TID   docusate sodium  100 mg Oral BID   enoxaparin (LOVENOX) injection  40 mg Subcutaneous Q24H   multivitamin with minerals  1 tablet Oral Daily   pantoprazole  40 mg Oral Daily   traMADol  50 mg Oral Q6H   Continuous Infusions:  sodium chloride 250 mL (06/12/21 0626)    Assessment &  Plan:   Active Problems:   Hip fracture (HCC)   1.Status post mechanical fall  Left hip fracture: History of multiple falls per husband due to Parkinson's 8/23 Ortho following Continue bowel regimen Status post reduction/internal fixation of displaced intertrochanteric left hip fracture Pain management PT recommends SNF Lovenox, foot pumps, TED hose for DVT prophylaxis Weightbearing as tolerated to the left leg    2.Acute blood loss- post surgery Yesterday repeat hemoglobin 7.9 did not require transfusion Today 7.4.  Continue to monitor if continues to drop 7 or less we will transfuse.  Already discussed transfusion with patient and husband on 8/22 and they were okay with transfusion if needed  3.  Hypertension- Stable BP meds on hold resume as needed      4. DL-continue statins   5. Parkinsons-restart her Sinemet  DVT prophylaxis: Lovenox Code Status: Full Family Communication: None at bedside Disposition Plan:  Status is: Inpatient  Remains inpatient  appropriate because:Inpatient level of care appropriate due to severity of illness  Dispo: The patient is from: Home              Anticipated d/c is to: SNF              Patient currently is not medically stable to d/c.   Difficult to place patient No    SNF pending.  Monitor H&H to see if she needs transfusion        LOS: 3 days   Time spent: 35 minutes with more than 50% on COC    Lynn Ito, MD [facility] Pager [phone]  If 7PM-7AM, please contact night-coverage 06/14/2021, 8:40 AM

## 2021-06-14 NOTE — Progress Notes (Signed)
Subjective: 3 Days Post-Op Procedure(s) (LRB): INTRAMEDULLARY (IM) NAIL INTERTROCHANTRIC (Left) Patient reports pain as mild.   Patient is resting well this afternoon, no signs of pain. Mental status appears to be improving, continue to hold narcotics as much as possible. Plan is to go Rehab after hospital stay. Negative for chest pain and shortness of breath Fever: no Gastrointestinal:Negative for nausea and vomiting  Objective: Vital signs in last 24 hours: Temp:  [98.2 F (36.8 C)-99.2 F (37.3 C)] 99.2 F (37.3 C) (08/23 1558) Pulse Rate:  [92-110] 110 (08/23 1558) Resp:  [17-18] 17 (08/23 1558) BP: (129-133)/(50-61) 133/59 (08/23 1558) SpO2:  [96 %-99 %] 99 % (08/23 1558)  Intake/Output from previous day:  Intake/Output Summary (Last 24 hours) at 06/14/2021 1634 Last data filed at 06/14/2021 0555 Gross per 24 hour  Intake 0 ml  Output 2250 ml  Net -2250 ml    Intake/Output this shift: No intake/output data recorded.  Labs: Recent Labs    06/12/21 0505 06/13/21 0617 06/13/21 1339 06/14/21 0330  HGB 8.8* 7.2* 7.9* 7.4*   Recent Labs    06/13/21 0617 06/13/21 1339 06/14/21 0330  WBC 9.1  --  9.7  RBC 2.25*  --  2.33*  HCT 22.2* 24.1* 23.0*  PLT 134*  --  148*   Recent Labs    06/12/21 0505 06/13/21 0617  NA 139  --   K 3.3* 3.3*  CL 105  --   CO2 27  --   BUN 21  --   CREATININE 0.54  --   GLUCOSE 138*  --   CALCIUM 8.3*  --    No results for input(s): LABPT, INR in the last 72 hours.  EXAM General - Patient is Alert Extremity - Neurovascular intact Sensation intact distally Dorsiflexion/Plantar flexion intact Compartment soft Dressing/Incision - clean, dry, blood tinged and scant  drainage Motor Function - intact, moving foot and toes well on exam.  Abdomen soft this morning.  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   Sleep apnea    Slurred speech    foggy minded    Assessment/Plan: 3 Days Post-Op Procedure(s) (LRB): INTRAMEDULLARY (IM) NAIL INTERTROCHANTRIC (Left) Active Problems:   Hip fracture (HCC)  Estimated body mass index is 30.77 kg/m as calculated from the following:   Height as of this encounter: 5\' 2"  (1.575 m).   Weight as of this encounter: 76.3 kg. Advance diet Up with therapy  Labs reviewed this AM. Hg 7.4, hold from transfusion at this time. Continue with tylenol and tramadol as needed for pain control. Patient is passing gas, work on BM.   Patient will need SNF upon discharge.  DVT Prophylaxis - Lovenox, Foot Pumps, and TED hose Weight-Bearing as tolerated to Left leg  J. , PA-C Orthopaedic Surgery 06/14/2021, 4:34 PM

## 2021-06-14 NOTE — TOC Progression Note (Signed)
Transition of Care [facility]) - Progression Note    Patient Details  Name: Tina Bowman MRN: [id number] Date of Birth: 11-13-1945  [facility] CM/SW Contact  Barrie Dunker, RN Phone Number: 06/14/2021, 11:20 AM  Clinical Narrative:     Sherron Monday with In[facility] Fermo [facility], Insurance is pending still, they will continue to check for approval       Expected Discharge Plan and Services                                                 Social Determinants of Health (SDOH) Interventions    Readmission Risk Interventions No flowsheet data found.

## 2021-06-14 NOTE — Care Management Important Message (Signed)
Important Message  Patient Details  Name: AZIA TOUTANT MRN: [id number] Date of Birth: Dec 26, 1945   Medicare Important Message Given:  N/A - LOS <3 / Initial given by admissions     Olegario Messier A Gibril Mastro 06/14/2021, 9:33 AM

## 2021-06-15 DIAGNOSIS — I10 Essential (primary) hypertension: Secondary | ICD-10-CM | POA: Diagnosis not present

## 2021-06-15 DIAGNOSIS — D62 Acute posthemorrhagic anemia: Secondary | ICD-10-CM | POA: Diagnosis not present

## 2021-06-15 DIAGNOSIS — S72002E Fracture of unspecified part of neck of left femur, subsequent encounter for open fracture type I or II with routine healing: Secondary | ICD-10-CM | POA: Diagnosis not present

## 2021-06-15 LAB — RESP PANEL BY RT-PCR (FLU A&B, COVID) ARPGX2
Influenza A by PCR: NEGATIVE
Influenza B by PCR: NEGATIVE
SARS Coronavirus 2 by RT PCR: NEGATIVE

## 2021-06-15 LAB — HEMOGLOBIN AND HEMATOCRIT, BLOOD
HCT: 22.3 % — ABNORMAL LOW (ref 36.0–46.0)
Hemoglobin: 7.4 g/dL — ABNORMAL LOW (ref 12.0–15.0)

## 2021-06-15 LAB — POTASSIUM: Potassium: 4.2 mmol/L (ref 3.5–5.1)

## 2021-06-15 MED ORDER — TRAMADOL HCL 50 MG PO TABS: 50.0000 mg | ORAL_TABLET | Freq: Four times a day (QID) | ORAL | 0 refills | Status: DC | PRN

## 2021-06-15 MED ORDER — ENOXAPARIN SODIUM 40 MG/0.4ML IJ SOSY: 40.0000 mg | PREFILLED_SYRINGE | INTRAMUSCULAR | 0 refills | Status: DC

## 2021-06-15 MED ORDER — CARBIDOPA-LEVODOPA 25-100 MG PO TABS: 2.0000 | ORAL_TABLET | Freq: Three times a day (TID) | ORAL | 0 refills | Status: AC

## 2021-06-15 NOTE — Progress Notes (Signed)
Physical Therapy Treatment Patient Details Name: Tina Bowman MRN: [id number] DOB: 01/26/1946 Today's Date: 06/15/2021    History of Present Illness 75 yo femal that presents with L hip fx, now s/p IMN. She has PMHx significant for Parkinson's disease, early dementia, sleep apnea, HTN, COPD, CAD, and fibromyalgia.    PT Comments    Pt was pleasant and willing to participate during the session. Pt reported dizziness in sitting so BP and HR was assessed with last recorded in supine 133/63 101bpm, in sitting at 116/62 114bpm, and in standing at 126/67 124 bpm. Pt still required min-mod A for bed mobility but was able to perform sit<>stand with min guard. Pt reported needing to void but was unable to transfer secondary to difficulty advancing RLE. Pt will benefit from PT services in a SNF setting upon discharge to safely address deficits listed in patient problem list for decreased caregiver assistance and eventual return to PLOF.     Follow Up Recommendations  SNF     Equipment Recommendations  Other (comment) (defer to next level of care)    Recommendations for Other Services       Precautions / Restrictions Precautions Precautions: Fall Restrictions Weight Bearing Restrictions: Yes LLE Weight Bearing: Weight bearing as tolerated    Mobility  Bed Mobility Overal bed mobility: Needs Assistance Bed Mobility: Sit to Supine;Supine to Sit     Supine to sit: Min assist;HOB elevated Sit to supine: Mod assist   General bed mobility comments: Min A for LEs during supine>sit and mod A for LEs and trunk during sit>supine    Transfers Overall transfer level: Needs assistance Equipment used: Rolling walker (2 wheeled) Transfers: Sit to/from Stand Sit to Stand: Min guard;From elevated surface         General transfer comment: Verbal cues for sequencing.  Ambulation/Gait Ambulation/Gait assistance: Min guard;Mod assist Gait Distance (Feet): 2 Feet Assistive device: Rolling  walker (2 wheeled) Gait Pattern/deviations: Step-to pattern;Decreased step length - right;Decreased stance time - left;Shuffle Gait velocity: decreased   General Gait Details: Verbal cues for sequencing, walker placement, and weight shifting. Pt demonstrated much difficulty advancing RLE secondary to decreased WB through LLE. Demonstrated posterior lean when walking backwards requiring mod A to prevent LOB.   Stairs             Wheelchair Mobility    Modified Rankin (Stroke Patients Only)       Balance Overall balance assessment: Needs assistance;History of Falls Sitting-balance support: Feet supported;No upper extremity supported Sitting balance-Leahy Scale: Good Sitting balance - Comments: Supervision at EOB Postural control: Posterior lean Standing balance support: Bilateral upper extremity supported;During functional activity Standing balance-Leahy Scale: Poor Standing balance comment: Pt demonstrated intermittent posterior lean requiring mod A to correct LOB.                            Cognition Arousal/Alertness: Awake/alert Behavior During Therapy: Flat affect Overall Cognitive Status: History of cognitive impairments - at baseline                                        Exercises Total Joint Exercises Long Arc Quad: AROM;Strengthening;Both;10 reps;Seated Marching in Standing: AROM;Strengthening;Both;10 reps;Seated;Standing (x10 seated, x10 standing) Other Exercises Other Exercises: Static standing 1-45min x2 with min guard for improved activity tolerance and balance.    General Comments  Pertinent Vitals/Pain Pain Assessment: 0-10 Pain Score: 5  Pain Location: L hip Pain Descriptors / Indicators: Aching;Sore Pain Intervention(s): Limited activity within patient's tolerance;Monitored during session    Home Living                      Prior Function            PT Goals (current goals can now be found in the  care plan section) Progress towards PT goals: Progressing toward goals    Frequency    BID      PT Plan Current plan remains appropriate    Co-evaluation              AM-PAC PT "6 Clicks" Mobility   Outcome Measure  Help needed turning from your back to your side while in a flat bed without using bedrails?: A Little Help needed moving from lying on your back to sitting on the side of a flat bed without using bedrails?: A Little Help needed moving to and from a bed to a chair (including a wheelchair)?: A Little Help needed standing up from a chair using your arms (e.g., wheelchair or bedside chair)?: A Little Help needed to walk in hospital room?: A Lot Help needed climbing 3-5 steps with a railing? : Total 6 Click Score: 15    End of Session Equipment Utilized During Treatment: Gait belt Activity Tolerance: Patient tolerated treatment well Patient left: in bed;with call bell/phone within reach;with nursing/sitter in room (Need for new purewick) Nurse Communication: Mobility status PT Visit Diagnosis: Unsteadiness on feet (R26.81);Difficulty in walking, not elsewhere classified (R26.2);Muscle weakness (generalized) (M62.81);Pain Pain - Right/Left: Left Pain - part of body: Hip     Time: 3212-2482 PT Time Calculation (min) (ACUTE ONLY): 32 min  Charges:                        Desiree Hane SPT 06/15/21, 3:25 PM

## 2021-06-15 NOTE — Progress Notes (Signed)
Subjective: 4 Days Post-Op Procedure(s) (LRB): INTRAMEDULLARY (IM) NAIL INTERTROCHANTRIC (Left) Patient reports pain as mild.   Patient's mental status has improved with decreasing narcotics. Husband in room with patient this AM, eating breakfast. Plan is to go Rehab after hospital stay.  Currently waiting on approval from [facility]. Negative for chest pain and shortness of breath Fever: no Gastrointestinal:Negative for nausea and vomiting  Objective: Vital signs in last 24 hours: Temp:  [98.2 F (36.8 C)-99.2 F (37.3 C)] 98.7 F (37.1 C) (08/24 0610) Pulse Rate:  [97-110] 105 (08/24 0610) Resp:  [16-19] 19 (08/24 0610) BP: (130-153)/(54-62) 130/62 (08/24 0610) SpO2:  [95 %-99 %] 95 % (08/24 0610)  Intake/Output from previous day:  Intake/Output Summary (Last 24 hours) at 06/15/2021 0747 Last data filed at 06/14/2021 2100 Gross per 24 hour  Intake 240 ml  Output 150 ml  Net 90 ml    Intake/Output this shift: No intake/output data recorded.  Labs: Recent Labs    06/13/21 0617 06/13/21 1339 06/14/21 0330  HGB 7.2* 7.9* 7.4*   Recent Labs    06/13/21 0617 06/13/21 1339 06/14/21 0330  WBC 9.1  --  9.7  RBC 2.25*  --  2.33*  HCT 22.2* 24.1* 23.0*  PLT 134*  --  148*   Recent Labs    06/13/21 0617  K 3.3*   No results for input(s): LABPT, INR in the last 72 hours.  EXAM General - Patient is Alert and Appropriate Extremity - ABD soft Neurovascular intact Sensation intact distally Dorsiflexion/Plantar flexion intact Compartment soft Dressing/Incision - clean, dry, blood tinged and scant  drainage Motor Function - intact, moving foot and toes well on exam.  Abdomen soft this morning with intact bowel sounds.  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   Sleep apnea    Slurred speech    foggy minded    Assessment/Plan: 4 Days Post-Op Procedure(s) (LRB): INTRAMEDULLARY (IM) NAIL INTERTROCHANTRIC (Left) Active Problems:   Hip fracture (HCC)  Estimated body mass index is 30.77 kg/m as calculated from the following:   Height as of this encounter: 5\' 2"  (1.575 m).   Weight as of this encounter: 76.3 kg. Advance diet Up with therapy  Vitals reviewed, HR this AM 105, awaiting on K+ and Hg results. Hg 7.4 yesterday, no transfusion at this time, awaiting re-check results. Continue with tylenol and tramadol as needed for pain control. Patient is passing gas, work on BM.   Patient will need SNF upon discharge.  Will discharge with tramadol and tylenol as needed for pain.  Continue Lovenox 40mg  daily for 14 days for DVT prophylaxis. Follow-up with [facility] in 10-14 days for staple removal and x-rays of the left femur.  DVT Prophylaxis - Lovenox, Foot Pumps, and TED hose Weight-Bearing as tolerated to Left leg  [facility], PA-C Orthopaedic Surgery 06/15/2021, 7:47 AM

## 2021-06-15 NOTE — Progress Notes (Signed)
Patient to transport via stretcher to [facility] Room 808 for continued skilled care. Called facility, [phone] and gave report to nurse Morrie Sheldon.

## 2021-06-15 NOTE — TOC Progression Note (Addendum)
Transition of Care [facility]) - Progression Note    Patient Details  Name: Tina Bowman MRN: [id number] Date of Birth: [birth year]  Transition of [facility] CM/SW Contact  Barrie Dunker, RN Phone Number: 06/15/2021, 9:17 AM  Clinical Narrative:     Received notification that the auth was approved to go to [facility].  Notified the Physician     Nurse called report, I contacted the husband and notified him of the DC and called [facility] to transport  Expected Discharge Plan and Services                                                 Social Determinants of Health (SDOH) Interventions    Readmission Risk Interventions No flowsheet data found.

## 2021-06-15 NOTE — Care Management Important Message (Signed)
Important Message  Patient Details  Name: Tina Bowman MRN: [id number] Date of Birth: Jul 16, 1946   Medicare Important Message Given:  Yes     Olegario Messier A Bao Bazen 06/15/2021, 1:23 PM

## 2021-06-15 NOTE — Discharge Instructions (Signed)

## 2021-06-15 NOTE — Discharge Summary (Addendum)
Physician Discharge Summary  Tina Bowman Tina Bowman DOB: 1946-02-26 DOA: 06/11/2021  PCP: Barbette Reichmann, MD  Admit date: 06/11/2021 Discharge date: 06/15/2021  Admitted From: home  Disposition:  SNF  Recommendations for Outpatient Follow-up:  Follow up with PCP in 1-2 weeks F/u w/ ortho surg, Dr. Joice Lofts or PA Marney Doctor in 10-14 days for suture removal & XR of left hop   Home Health: no  Equipment/Devices:  Discharge Condition: stable  CODE STATUS: full  Diet recommendation: Heart Healthy   Brief/Interim Summary: HPI was taken from Dr. Marylu Lund: Tina Bowman is a 75 y.o. female with history of Parkinson's disease, asthma, hypertension, HCC, slurred speech and status multiple falls presents complaining of hip pain.  Patient went to the bathroom and as she was getting up she stated her underwear got caught on her feet and she had a mechanical fall.  Apparently was witnessed by husband.  She normally uses her rolling walker.  She was complaining of left hip pain.  EMS was called and patient was brought in.  Other than today she had fallen 10 days ago and 3 weeks ago per husband.  Per husband history of multiple falls due to her Parkinson's.     ED course: Blood pressure 139/74, afebrile, satting 98% on room air, heart rate 93 Hip x-ray:Acute, horizontal, displaced left intertrochanteric femur fracture with foreshortening and displacement.  Chest x-ray personally reviewed no acute disease.  COVID/respiratory panel negative.  Potassium 3.2 today.  Hematocrit 11.4 hemoglobin 33.9.  Otherwise unremarkable labs   CTS cervical spine:1.  No evidence for acute intracranial abnormality. 2. Moderate mid cervical degenerative changes without acute fracture. 3. Fracture of the LATERAL wall of the LEFT maxillary sinus, associated with air-fluid level sinus     CT of the head:1.  No evidence for acute intracranial abnormality. 2. Moderate mid cervical degenerative changes without  acute fracture. 3. Fracture of the LATERAL wall of the LEFT maxillary sinus, associated with air-fluid level sinus.     Orthopedics was consulted and plan was to take patient to the OR today.   Hospital course from Dr. Marylu Lund: Tina Bowman is a 75 y.o. female with history of Parkinson's disease, asthma, hypertension, HCC, slurred speech and status multiple falls presents complaining of hip pain.  Patient went to the bathroom and as she was getting up she stated her underwear got caught on her feet and she had a mechanical fall.  Apparently was witnessed by husband.  She normally uses her rolling walker.  She was complaining of left hip pain.  EMS was called and patient was brought in.  Other than today she had fallen 10 days ago and 3 weeks ago per husband.  Per husband history of multiple falls due to her Parkinson's   8/21 status post IM nail intertrochanteric done by Dr. Joice Lofts on 8/20 8/23-no overnight issues.  Hemoglobin 7.4.   As per Dr. Mayford Knife on 06/15/21: Tina Bowman Hb was 7.4 again today and vitals are stable. Tina Bowman did not receive a pRBC transfusion while inpatient. Insurance [employer] was approved for SNF today. Tina Bowman will be d/c to SNF.   Discharge Diagnoses:  Active Problems:   Hip fracture (HCC)   Left hip fracture: secondary to mechanical fall at home. Hx of multiple falls per husband due to Parkinson's. S/p reduction/internal fixation of displaced intertrochanteric left hip fracture. Weightbearing as tolerated of LLE. Lovenox x 14 days for DVT prophylaxis as per ortho surg. Tina Bowman/OT recs SNF.   Acute blood loss anemia:  likely secondary to above surg. H&H are stable. Will transfuse if Hb < 7.0.  HTN: BP is WNL but Tina Bowman can restart home BP meds at d/c   HLD: continue on statin   Parkinson: continue on home dose of sinemet   Likely OSA: continue on CPAP qhs. CPAP settings as per RT   Discharge Instructions  Discharge Instructions     Diet - low sodium heart healthy   Complete by: As  directed    Discharge instructions   Complete by: As directed    F/u w/ PCP in 1-2 weeks. F/u w/ ortho surg, Dr. Joice Lofts or his PA, in 10-14 days for suture removal & XR left of hip.   Increase activity slowly   Complete by: As directed    No wound care   Complete by: As directed       Allergies as of 06/15/2021       Reactions   Methadone Other (See Comments)   Tina Bowman doesn't know   Niacin And Related Other (See Comments)   Gives her hot flashes   Oxycontin [oxycodone] Other (See Comments)   Makes her feel strange.   Sulfa Antibiotics Other (See Comments)   Tina Bowman doesn't know   Other Rash   Band aids (different brands) causes Tina Bowman to break out in a rash.         Medication List     STOP taking these medications    HYDROcodone-acetaminophen 5-325 MG tablet Commonly known as: NORCO/VICODIN       TAKE these medications    albuterol 108 (90 Base) MCG/ACT inhaler Commonly known as: VENTOLIN HFA Inhale 2 puffs into the lungs every 6 (six) hours as needed for wheezing or shortness of breath.   aspirin 325 MG tablet Take 325 mg by mouth daily.   atorvastatin 40 MG tablet Commonly known as: LIPITOR Take 40 mg by mouth every evening.   calcium-vitamin D 500-200 MG-UNIT tablet Commonly known as: OSCAL WITH D Take 1 tablet by mouth.   carbidopa-levodopa 25-100 MG tablet Commonly known as: SINEMET IR Take 2 tablets by mouth 3 (three) times daily.   enalapril 20 MG tablet Commonly known as: VASOTEC Take 20 mg by mouth daily.   enoxaparin 40 MG/0.4ML injection Commonly known as: LOVENOX Inject 0.4 mLs (40 mg total) into the skin daily.   hydrALAZINE 25 MG tablet Commonly known as: APRESOLINE Take 25 mg by mouth 2 (two) times daily.   levalbuterol 1.25 MG/3ML nebulizer solution Commonly known as: XOPENEX Take 1.25 mg by nebulization every 4 (four) hours as needed for wheezing or shortness of breath.   loratadine 10 MG tablet Commonly known as: CLARITIN Take 10 mg  by mouth daily.   metoprolol tartrate 50 MG tablet Commonly known as: LOPRESSOR Take 50 mg by mouth 2 (two) times daily.   montelukast 10 MG tablet Commonly known as: SINGULAIR Take 10 mg by mouth at bedtime.   omeprazole 40 MG capsule Commonly known as: PRILOSEC Take 40 mg by mouth daily.   ranitidine 150 MG tablet Commonly known as: ZANTAC Take 150 mg by mouth at bedtime.   traMADol 50 MG tablet Commonly known as: ULTRAM Take 1 tablet (50 mg total) by mouth every 6 (six) hours as needed for severe pain. What changed:  how much to take reasons to take this   umeclidinium bromide 62.5 MCG/INH Aepb Commonly known as: INCRUSE ELLIPTA Inhale 1 puff into the lungs daily.        Contact  information for follow-up providers     Anson [place], PA-C Follow up in 14 day(s).   Specialty: Physician Assistant Why: Staple removal and x-rays.; Facility will make Appt. Contact information: [address] [place] [address] [phone]              Contact information for after-discharge care     Destination     [facility] Preferred SNF .   Service: Skilled Nursing Contact information: [address] [place] [address] [phone]                    Allergies  Allergen Reactions   Methadone Other (See Comments)    Tina Bowman doesn't know   Niacin And Related Other (See Comments)    Gives her hot flashes   Oxycontin [Oxycodone] Other (See Comments)    Makes her feel strange.   Sulfa Antibiotics Other (See Comments)    Tina Bowman doesn't know   Other Rash    Band aids (different brands) causes Tina Bowman to break out in a rash.     Consultations: Ortho surg   Procedures/Studies: CT HEAD WO CONTRAST ( )  Result Date: 06/11/2021 CLINICAL DATA:  Unwitnessed fall while going to the bathroom. EXAM: CT HEAD WITHOUT CONTRAST CT CERVICAL SPINE WITHOUT CONTRAST TECHNIQUE: Multidetector CT imaging of the  head and cervical spine was performed following the standard protocol without intravenous contrast. Multiplanar CT image reconstructions of the cervical spine were also generated. COMPARISON:  None. FINDINGS: CT HEAD FINDINGS Brain: No evidence of acute infarction, hemorrhage, hydrocephalus, extra-axial collection or mass lesion/mass effect. Vascular: There is minimal atherosclerotic calcification of the internal carotid arteries. No hyperdense vessels. Skull: Normal. Negative for fracture or focal lesion. Sinuses/Orbits: Ethmoid air cells are normal. Partially imaged LEFT maxillary sinus air-fluid level. Mastoid air cells are normal. Other: None CT CERVICAL SPINE FINDINGS Alignment: There is reversal of the cervical lordosis in the mid cervical levels, related to degenerative changes. There is 2 millimeters anterolisthesis of C5 on C6. Skull base and vertebrae: No acute fracture. No primary bone lesion or focal pathologic process. Soft tissues and spinal canal: No prevertebral fluid or swelling. No visible canal hematoma. Disc levels: Significant disc height loss and uncovertebral spurring at C5-6, C6-7, and C7-T1. Upper chest: Negative. Other: Note is made of air-fluid level within the LEFT maxillary sinus, associated with a minimally displaced fracture of the LATERAL wall. Remote ORIF of the RIGHT clavicle. IMPRESSION: 1.  No evidence for acute intracranial abnormality. 2. Moderate mid cervical degenerative changes without acute fracture. 3. Fracture of the LATERAL wall of the LEFT maxillary sinus, associated with air-fluid level sinus. Electronically Signed   By: Norva Pavlov M.D.   On: 06/11/2021 10:07   CT Cervical Spine Wo Contrast  Result Date: 06/11/2021 CLINICAL DATA:  Unwitnessed fall while going to the bathroom. EXAM: CT HEAD WITHOUT CONTRAST CT CERVICAL SPINE WITHOUT CONTRAST TECHNIQUE: Multidetector CT imaging of the head and cervical spine was performed following the standard protocol without  intravenous contrast. Multiplanar CT image reconstructions of the cervical spine were also generated. COMPARISON:  None. FINDINGS: CT HEAD FINDINGS Brain: No evidence of acute infarction, hemorrhage, hydrocephalus, extra-axial collection or mass lesion/mass effect. Vascular: There is minimal atherosclerotic calcification of the internal carotid arteries. No hyperdense vessels. Skull: Normal. Negative for fracture or focal lesion. Sinuses/Orbits: Ethmoid air cells are normal. Partially imaged LEFT maxillary sinus air-fluid level. Mastoid air cells are normal. Other: None CT CERVICAL  SPINE FINDINGS Alignment: There is reversal of the cervical lordosis in the mid cervical levels, related to degenerative changes. There is 2 millimeters anterolisthesis of C5 on C6. Skull base and vertebrae: No acute fracture. No primary bone lesion or focal pathologic process. Soft tissues and spinal canal: No prevertebral fluid or swelling. No visible canal hematoma. Disc levels: Significant disc height loss and uncovertebral spurring at C5-6, C6-7, and C7-T1. Upper chest: Negative. Other: Note is made of air-fluid level within the LEFT maxillary sinus, associated with a minimally displaced fracture of the LATERAL wall. Remote ORIF of the RIGHT clavicle. IMPRESSION: 1.  No evidence for acute intracranial abnormality. 2. Moderate mid cervical degenerative changes without acute fracture. 3. Fracture of the LATERAL wall of the LEFT maxillary sinus, associated with air-fluid level sinus. Electronically Signed   By: Norva Pavlov M.D.   On: 06/11/2021 10:07   DG Chest Portable 1 View  Result Date: 06/11/2021 CLINICAL DATA:  Preoperative examination. EXAM: PORTABLE CHEST 1 VIEW COMPARISON:  09/12/2017 FINDINGS: Grossly unchanged cardiac silhouette given decreased lung volumes and supine technique. Atherosclerotic plaque when the thoracic aorta. Lungs appear hyperexpanded with mild diffuse slightly nodular thickening the pulmonary  interstitium. No discrete focal airspace opacities. No pleural effusion or pneumothorax. No evidence of edema. No definite acute osseous abnormalities. Spinal stimulator leads overlie the midthoracic spine. Post ORIF of the right clavicle, incompletely imaged. IMPRESSION: No acute cardiopulmonary disease on this AP supine portable examination. Electronically Signed   By: Simonne Come M.D.   On: 06/11/2021 09:40   DG C-Arm 1-60 Min  Result Date: 06/11/2021 CLINICAL DATA:  LEFT femoral IM nail. EXAM: DG C-ARM 1-60 MIN; LEFT FEMUR 2 VIEWS FLUOROSCOPY TIME:  Fluoroscopy Time:  41 seconds Number of Acquired Spot Images: 18 COMPARISON:  06/11/2021 FINDINGS: Images are performed during the placement of intramedullary nail with proximal and distal fixation in the LEFT femur. IMPRESSION: ORIF of intertrochanteric fracture of the LEFT femur. Electronically Signed   By: Norva Pavlov M.D.   On: 06/11/2021 13:18   DG Hip Unilat W or Wo Pelvis 2-3 Views Left  Result Date: 06/11/2021 CLINICAL DATA:  Unwitnessed fall in the bathroom. EXAM: DG HIP (WITH OR WITHOUT PELVIS) 2-3V LEFT COMPARISON:  None. FINDINGS: There is an acute, horizontal, displaced fracture involving the intertrochanteric region of the femur with foreshortening and anteromedial displacement of the distal fracture fragment. No definitive intra-articular extension. Expected adjacent soft tissue swelling. No radiopaque foreign body No additional fractures identified. Spinal stimulator device overlies the medial aspect the left buttocks. IMPRESSION: Acute, horizontal, displaced left intertrochanteric femur fracture with foreshortening and displacement. Electronically Signed   By: Simonne Come M.D.   On: 06/11/2021 09:38   DG Hip Unilat With Pelvis 2-3 Views Left  Result Date: 06/04/2021 CLINICAL DATA:  Fall with left hip pain EXAM: DG HIP (WITH OR WITHOUT PELVIS) 2-3V LEFT COMPARISON:  09/12/2017 FINDINGS: No evidence of acute fracture or dislocation of  the left hip or pelvis. There is a chronic deformity of the parasymphyseal aspect of the right pubic bone. Bones appear slightly demineralized. Scattered vascular calcifications. No discernible soft tissue abnormality. IMPRESSION: No evidence of acute fracture or dislocation of the left hip or pelvis. Electronically Signed   By: Duanne Guess D.O.   On: 06/04/2021 13:14   DG FEMUR MIN 2 VIEWS LEFT  Result Date: 06/11/2021 CLINICAL DATA:  LEFT femoral IM nail. EXAM: DG C-ARM 1-60 MIN; LEFT FEMUR 2 VIEWS FLUOROSCOPY TIME:  Fluoroscopy Time:  41 seconds Number of Acquired Spot Images: 18 COMPARISON:  06/11/2021 FINDINGS: Images are performed during the placement of intramedullary nail with proximal and distal fixation in the LEFT femur. IMPRESSION: ORIF of intertrochanteric fracture of the LEFT femur. Electronically Signed   By: Norva Pavlov M.D.   On: 06/11/2021 13:18   (Echo, Carotid, EGD, Colonoscopy, ERCP)    Subjective: Tina Bowman c/o fatigue    Discharge Exam: Vitals:   06/15/21 0758 06/15/21 1218  BP: 133/63 (!) 120/52  Pulse: (!) 101 (!) 108  Resp: 17 17  Temp: 98.9 F (37.2 C) 98.7 F (37.1 C)  SpO2: 98% 96%   Vitals:   06/14/21 2340 06/15/21 0610 06/15/21 0758 06/15/21 1218  BP: (!) 153/62 130/62 133/63 (!) 120/52  Pulse: (!) 104 (!) 105 (!) 101 (!) 108  Resp: Temp: 98.2 F (36.8 C) 98.7 F (37.1 C) 98.9 F (37.2 C) 98.7 F (37.1 C)  TempSrc: Oral  Oral   SpO2: 97% 95% 98% 96%  Weight:      Height:        General: Tina Bowman is alert, awake, not in acute distress Cardiovascular:  S1/S2 +, no rubs, no gallops Respiratory: CTA bilaterally, no wheezing, no rhonchi Abdominal: Soft, NT, ND, bowel sounds + Extremities: no edema, no cyanosis    The results of significant diagnostics from this hospitalization (including imaging, microbiology, ancillary and laboratory) are listed below for reference.     Microbiology: Recent Results (from the past 240 hour(s))   Resp Panel by RT-PCR (Flu A&B, Covid) Nasopharyngeal Swab     Status: None   Collection Time: 06/11/21  9:38 AM   Specimen: Nasopharyngeal Swab; Nasopharyngeal(NP) swabs in vial transport medium  Result Value Ref Range Status   SARS Coronavirus 2 by RT PCR NEGATIVE NEGATIVE Final    Comment: (NOTE) SARS-CoV-2 target nucleic acids are NOT DETECTED.  The SARS-CoV-2 RNA is generally detectable in upper respiratory specimens during the acute phase of infection. The lowest concentration of SARS-CoV-2 viral copies this assay can detect is 138 copies/mL. A negative result does not preclude SARS-Cov-2 infection and should not be used as the sole basis for treatment or other patient management decisions. A negative result may occur with  improper specimen collection/handling, submission of specimen other than nasopharyngeal swab, presence of viral mutation(s) within the areas targeted by this assay, and inadequate number of viral copies(<138 copies/mL). A negative result must be combined with clinical observations, patient history, and epidemiological information. The expected result is Negative.  Fact Sheet for Patients:  BloggerCourse.com  Fact Sheet for Healthcare Providers:  SeriousBroker.it  This test is no t yet approved or cleared by the [place] FDA and  has been authorized for detection and/or diagnosis of SARS-CoV-2 by FDA under an Emergency Use Authorization (EUA). This EUA will remain  in effect (meaning this test can be used) for the duration of the COVID-19 declaration under Section 564(b)(1) of the Act, 21 U.S.C.section 360bbb-3(b)(1), unless the authorization is terminated  or revoked sooner.       Influenza A by PCR NEGATIVE NEGATIVE Final   Influenza B by PCR NEGATIVE NEGATIVE Final    Comment: (NOTE) The Xpert Xpress SARS-CoV-2/FLU/RSV plus assay is intended as an aid in the diagnosis of influenza from  Nasopharyngeal swab specimens and should not be used as a sole basis for treatment. Nasal washings and aspirates are unacceptable for Xpert Xpress SARS-CoV-2/FLU/RSV testing.  Fact Sheet for Patients: BloggerCourse.com  Fact Sheet  for Healthcare Providers: SeriousBroker.it  This test is not yet approved or cleared by the [place] and has been authorized for detection and/or diagnosis of SARS-CoV-2 by FDA under an Emergency Use Authorization (EUA). This EUA will remain in effect (meaning this test can be used) for the duration of the COVID-19 declaration under Section 564(b)(1) of the Act, 21 U.S.C. section 360bbb-3(b)(1), unless the authorization is terminated or revoked.  Performed at [facility], [address] [place], [place] [address]   Resp Panel by RT-PCR (Flu A&B, Covid) Nasopharyngeal Swab     Status: None   Collection Time: 06/15/21 10:07 AM   Specimen: Nasopharyngeal Swab; Nasopharyngeal(NP) swabs in vial transport medium  Result Value Ref Range Status   SARS Coronavirus 2 by RT PCR NEGATIVE NEGATIVE Final    Comment: (NOTE) SARS-CoV-2 target nucleic acids are NOT DETECTED.  The SARS-CoV-2 RNA is generally detectable in upper respiratory specimens during the acute phase of infection. The lowest concentration of SARS-CoV-2 viral copies this assay can detect is 138 copies/mL. A negative result does not preclude SARS-Cov-2 infection and should not be used as the sole basis for treatment or other patient management decisions. A negative result may occur with  improper specimen collection/handling, submission of specimen other than nasopharyngeal swab, presence of viral mutation(s) within the areas targeted by this assay, and inadequate number of viral copies(<138 copies/mL). A negative result must be combined with clinical observations, patient history, and epidemiological information. The expected  result is Negative.  Fact Sheet for Patients:  BloggerCourse.com  Fact Sheet for Healthcare Providers:  SeriousBroker.it  This test is no t yet approved or cleared by the [place] FDA and  has been authorized for detection and/or diagnosis of SARS-CoV-2 by FDA under an Emergency Use Authorization (EUA). This EUA will remain  in effect (meaning this test can be used) for the duration of the COVID-19 declaration under Section 564(b)(1) of the Act, 21 U.S.C.section 360bbb-3(b)(1), unless the authorization is terminated  or revoked sooner.       Influenza A by PCR NEGATIVE NEGATIVE Final   Influenza B by PCR NEGATIVE NEGATIVE Final    Comment: (NOTE) The Xpert Xpress SARS-CoV-2/FLU/RSV plus assay is intended as an aid in the diagnosis of influenza from Nasopharyngeal swab specimens and should not be used as a sole basis for treatment. Nasal washings and aspirates are unacceptable for Xpert Xpress SARS-CoV-2/FLU/RSV testing.  Fact Sheet for Patients: BloggerCourse.com  Fact Sheet for Healthcare Providers: SeriousBroker.it  This test is not yet approved or cleared by the [place] FDA and has been authorized for detection and/or diagnosis of SARS-CoV-2 by FDA under an Emergency Use Authorization (EUA). This EUA will remain in effect (meaning this test can be used) for the duration of the COVID-19 declaration under Section 564(b)(1) of the Act, 21 U.S.C. section 360bbb-3(b)(1), unless the authorization is terminated or revoked.  Performed at [facility], [address] [place], [place] [address]      Labs: BNP (last 3 results) No results for input(s): BNP in the last 8760 hours. Basic Metabolic Panel: Recent Labs  Lab 06/11/21 0908 06/12/21 0505 06/13/21 0617 06/15/21 0724  NA 141 139  --   --   K 3.2* 3.3* 3.3* 4.2  CL 105 105  --   --   CO2 24 27   --   --   GLUCOSE 139* 138*  --   --   BUN 23 21  --   --  CREATININE 0.63 0.54  --   --   CALCIUM 8.7* 8.3*  --   --    Liver Function Tests: No results for input(s): AST, ALT, ALKPHOS, BILITOT, PROT, ALBUMIN in the last 168 hours. No results for input(s): LIPASE, AMYLASE in the last 168 hours. No results for input(s): AMMONIA in the last 168 hours. CBC: Recent Labs  Lab 06/11/21 0908 06/12/21 0505 06/13/21 0617 06/13/21 1339 06/14/21 0330 06/15/21 0724  WBC 9.6 14.2* 9.1  --  9.7  --   NEUTROABS 7.6  --   --   --   --   --   HGB 11.4* 8.8* 7.2* 7.9* 7.4* 7.4*  HCT 33.9* 26.8* 22.2* 24.1* 23.0* 22.3*  MCV 98.5 99.6 98.7  --  98.7  --   PLT 188 167 134*  --  148*  --    Cardiac Enzymes: No results for input(s): CKTOTAL, CKMB, CKMBINDEX, TROPONINI in the last 168 hours. BNP: Invalid input(s): POCBNP CBG: No results for input(s): GLUCAP in the last 168 hours. D-Dimer No results for input(s): DDIMER in the last 72 hours. Hgb A1c No results for input(s): HGBA1C in the last 72 hours. Lipid Profile No results for input(s): CHOL, HDL, LDLCALC, TRIG, CHOLHDL, LDLDIRECT in the last 72 hours. Thyroid function studies No results for input(s): TSH, T4TOTAL, T3FREE, THYROIDAB in the last 72 hours.  Invalid input(s): FREET3 Anemia work up No results for input(s): VITAMINB12, FOLATE, FERRITIN, TIBC, IRON, RETICCTPCT in the last 72 hours. Urinalysis No results found for: COLORURINE, APPEARANCEUR, LABSPEC, PHURINE, GLUCOSEU, HGBUR, BILIRUBINUR, KETONESUR, PROTEINUR, UROBILINOGEN, NITRITE, LEUKOCYTESUR Sepsis Labs Invalid input(s): PROCALCITONIN,  WBC,  LACTICIDVEN Microbiology Recent Results (from the past 240 hour(s))  Resp Panel by RT-PCR (Flu A&B, Covid) Nasopharyngeal Swab     Status: None   Collection Time: 06/11/21  9:38 AM   Specimen: Nasopharyngeal Swab; Nasopharyngeal(NP) swabs in vial transport medium  Result Value Ref Range Status   SARS Coronavirus 2 by RT PCR  NEGATIVE NEGATIVE Final    Comment: (NOTE) SARS-CoV-2 target nucleic acids are NOT DETECTED.  The SARS-CoV-2 RNA is generally detectable in upper respiratory specimens during the acute phase of infection. The lowest concentration of SARS-CoV-2 viral copies this assay can detect is 138 copies/mL. A negative result does not preclude SARS-Cov-2 infection and should not be used as the sole basis for treatment or other patient management decisions. A negative result may occur with  improper specimen collection/handling, submission of specimen other than nasopharyngeal swab, presence of viral mutation(s) within the areas targeted by this assay, and inadequate number of viral copies(<138 copies/mL). A negative result must be combined with clinical observations, patient history, and epidemiological information. The expected result is Negative.  Fact Sheet for Patients:  BloggerCourse.comhttps://www.fda.gov/media/152166/download  Fact Sheet for Healthcare Providers:  SeriousBroker.ithttps://www.fda.gov/media/152162/download  This test is no t yet approved or cleared by the [place] FDA and  has been authorized for detection and/or diagnosis of SARS-CoV-2 by FDA under an Emergency Use Authorization (EUA). This EUA will remain  in effect (meaning this test can be used) for the duration of the COVID-19 declaration under Section 564(b)(1) of the Act, 21 U.S.C.section 360bbb-3(b)(1), unless the authorization is terminated  or revoked sooner.       Influenza A by PCR NEGATIVE NEGATIVE Final   Influenza B by PCR NEGATIVE NEGATIVE Final    Comment: (NOTE) The Xpert Xpress SARS-CoV-2/FLU/RSV plus assay is intended as an aid in the diagnosis of influenza from Nasopharyngeal swab specimens and  should not be used as a sole basis for treatment. Nasal washings and aspirates are unacceptable for Xpert Xpress SARS-CoV-2/FLU/RSV testing.  Fact Sheet for Patients: BloggerCourse.com  Fact Sheet for  Healthcare Providers: SeriousBroker.it  This test is not yet approved or cleared by the [place] FDA and has been authorized for detection and/or diagnosis of SARS-CoV-2 by FDA under an Emergency Use Authorization (EUA). This EUA will remain in effect (meaning this test can be used) for the duration of the COVID-19 declaration under Section 564(b)(1) of the Act, 21 U.S.C. section 360bbb-3(b)(1), unless the authorization is terminated or revoked.  Performed at [facility], [address] [place], [place] [address]   Resp Panel by RT-PCR (Flu A&B, Covid) Nasopharyngeal Swab     Status: None   Collection Time: 06/15/21 10:07 AM   Specimen: Nasopharyngeal Swab; Nasopharyngeal(NP) swabs in vial transport medium  Result Value Ref Range Status   SARS Coronavirus 2 by RT PCR NEGATIVE NEGATIVE Final    Comment: (NOTE) SARS-CoV-2 target nucleic acids are NOT DETECTED.  The SARS-CoV-2 RNA is generally detectable in upper respiratory specimens during the acute phase of infection. The lowest concentration of SARS-CoV-2 viral copies this assay can detect is 138 copies/mL. A negative result does not preclude SARS-Cov-2 infection and should not be used as the sole basis for treatment or other patient management decisions. A negative result may occur with  improper specimen collection/handling, submission of specimen other than nasopharyngeal swab, presence of viral mutation(s) within the areas targeted by this assay, and inadequate number of viral copies(<138 copies/mL). A negative result must be combined with clinical observations, patient history, and epidemiological information. The expected result is Negative.  Fact Sheet for Patients:  BloggerCourse.com  Fact Sheet for Healthcare Providers:  SeriousBroker.it  This test is no t yet approved or cleared by the [place] FDA and  has been  authorized for detection and/or diagnosis of SARS-CoV-2 by FDA under an Emergency Use Authorization (EUA). This EUA will remain  in effect (meaning this test can be used) for the duration of the COVID-19 declaration under Section 564(b)(1) of the Act, 21 U.S.C.section 360bbb-3(b)(1), unless the authorization is terminated  or revoked sooner.       Influenza A by PCR NEGATIVE NEGATIVE Final   Influenza B by PCR NEGATIVE NEGATIVE Final    Comment: (NOTE) The Xpert Xpress SARS-CoV-2/FLU/RSV plus assay is intended as an aid in the diagnosis of influenza from Nasopharyngeal swab specimens and should not be used as a sole basis for treatment. Nasal washings and aspirates are unacceptable for Xpert Xpress SARS-CoV-2/FLU/RSV testing.  Fact Sheet for Patients: BloggerCourse.com  Fact Sheet for Healthcare Providers: SeriousBroker.it  This test is not yet approved or cleared by the [place] FDA and has been authorized for detection and/or diagnosis of SARS-CoV-2 by FDA under an Emergency Use Authorization (EUA). This EUA will remain in effect (meaning this test can be used) for the duration of the COVID-19 declaration under Section 564(b)(1) of the Act, 21 U.S.C. section 360bbb-3(b)(1), unless the authorization is terminated or revoked.  Performed at [facility], [address] [place], [place] [address]      Time coordinating discharge: Over 30 minutes  SIGNED:   Charise Killian, MD  [facility] 06/15/2021, 12:49 PM Pager   If 7PM-7AM, please contact night-coverage

## 2021-07-21 ENCOUNTER — Ambulatory Visit: Payer: Medicare HMO

## 2021-07-21 ENCOUNTER — Ambulatory Visit: Payer: Medicare HMO | Admitting: Speech Pathology

## 2021-07-25 ENCOUNTER — Ambulatory Visit: Payer: Medicare HMO | Admitting: Speech Pathology

## 2021-07-25 ENCOUNTER — Ambulatory Visit: Payer: Medicare HMO

## 2021-07-26 ENCOUNTER — Ambulatory Visit: Payer: Medicare HMO | Admitting: Speech Pathology

## 2021-07-26 ENCOUNTER — Ambulatory Visit: Payer: Medicare HMO

## 2021-07-27 ENCOUNTER — Ambulatory Visit: Payer: Medicare HMO

## 2021-07-27 ENCOUNTER — Ambulatory Visit: Payer: Medicare HMO | Admitting: Speech Pathology

## 2021-07-28 ENCOUNTER — Ambulatory Visit: Payer: Medicare HMO | Admitting: Speech Pathology

## 2021-07-28 ENCOUNTER — Ambulatory Visit: Payer: Medicare HMO

## 2021-08-01 ENCOUNTER — Ambulatory Visit: Payer: Medicare HMO

## 2021-08-01 ENCOUNTER — Ambulatory Visit: Payer: Medicare HMO | Admitting: Speech Pathology

## 2021-08-02 ENCOUNTER — Ambulatory Visit: Payer: Medicare HMO

## 2021-08-02 ENCOUNTER — Ambulatory Visit: Payer: Medicare HMO | Admitting: Speech Pathology

## 2021-08-03 ENCOUNTER — Ambulatory Visit: Payer: Medicare HMO

## 2021-08-03 ENCOUNTER — Ambulatory Visit: Payer: Medicare HMO | Admitting: Speech Pathology

## 2021-08-04 ENCOUNTER — Ambulatory Visit: Payer: Medicare HMO | Admitting: Speech Pathology

## 2021-08-04 ENCOUNTER — Ambulatory Visit: Payer: Medicare HMO

## 2021-08-08 ENCOUNTER — Encounter: Payer: Medicare HMO | Admitting: Speech Pathology

## 2021-08-08 ENCOUNTER — Ambulatory Visit: Payer: Medicare HMO

## 2021-08-09 ENCOUNTER — Ambulatory Visit: Payer: Medicare HMO

## 2021-08-09 ENCOUNTER — Encounter: Payer: Medicare HMO | Admitting: Speech Pathology

## 2021-08-10 ENCOUNTER — Encounter: Payer: Medicare HMO | Admitting: Speech Pathology

## 2021-08-10 ENCOUNTER — Ambulatory Visit: Payer: Medicare HMO

## 2021-08-11 ENCOUNTER — Ambulatory Visit: Payer: Medicare HMO

## 2021-08-11 ENCOUNTER — Encounter: Payer: Medicare HMO | Admitting: Speech Pathology

## 2021-08-15 ENCOUNTER — Encounter: Payer: Medicare HMO | Admitting: Speech Pathology

## 2021-08-15 ENCOUNTER — Ambulatory Visit: Payer: Medicare HMO

## 2021-08-16 ENCOUNTER — Ambulatory Visit: Payer: Medicare HMO

## 2021-08-16 ENCOUNTER — Encounter: Payer: Medicare HMO | Admitting: Speech Pathology

## 2021-08-17 ENCOUNTER — Encounter: Payer: Medicare HMO | Admitting: Speech Pathology

## 2021-08-17 ENCOUNTER — Ambulatory Visit: Payer: Medicare HMO

## 2021-08-18 ENCOUNTER — Encounter: Payer: Medicare HMO | Admitting: Speech Pathology

## 2021-08-18 ENCOUNTER — Ambulatory Visit: Payer: Medicare HMO

## 2021-08-22 ENCOUNTER — Ambulatory Visit: Payer: Medicare HMO

## 2021-08-23 ENCOUNTER — Ambulatory Visit: Payer: Medicare HMO

## 2021-08-24 ENCOUNTER — Ambulatory Visit: Payer: Medicare HMO

## 2021-08-25 ENCOUNTER — Ambulatory Visit: Payer: Medicare HMO

## 2021-12-21 ENCOUNTER — Other Ambulatory Visit: Payer: Self-pay

## 2021-12-21 ENCOUNTER — Ambulatory Visit: Payer: Medicare HMO | Attending: Neurology

## 2021-12-21 DIAGNOSIS — M25552 Pain in left hip: Secondary | ICD-10-CM | POA: Diagnosis present

## 2021-12-21 DIAGNOSIS — R262 Difficulty in walking, not elsewhere classified: Secondary | ICD-10-CM | POA: Insufficient documentation

## 2021-12-21 DIAGNOSIS — M6281 Muscle weakness (generalized): Secondary | ICD-10-CM | POA: Insufficient documentation

## 2021-12-21 DIAGNOSIS — R278 Other lack of coordination: Secondary | ICD-10-CM | POA: Insufficient documentation

## 2021-12-21 DIAGNOSIS — R2689 Other abnormalities of gait and mobility: Secondary | ICD-10-CM | POA: Diagnosis present

## 2021-12-21 DIAGNOSIS — R2681 Unsteadiness on feet: Secondary | ICD-10-CM | POA: Diagnosis not present

## 2021-12-21 NOTE — Therapy (Deleted)
[place] ?[facility] ?[address] ?[place], [place], [address] ?Phone: [phone]   Fax:  [phone] ? ?Physical Therapy Evaluation ? ?Patient Details  ?Name: Tina Bowman ?MRN: [id number] ?Date of Birth: [birth year] ?Referring Provider (PT): [place], [place] ? ? ?Encounter Date: 12/21/2021 ? ? ? ?Past Medical History:  ?Diagnosis Date  ? Arthritis   ? Asthma   ? COPD (chronic obstructive pulmonary disease) (HCC)   ? Fibromyalgia   ? Heart disease   ? Hypertension   ? Parkinsonism (HCC)   ? 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 Pt reported this dx has been recinded by the MD at the [facility] disorders clinic, and replaced with Atypical Parkinsonism..  ? Sleep apnea   ? Slurred speech   ? foggy minded  ? ? ?Past Surgical History:  ?Procedure Laterality Date  ? ABDOMINAL HYSTERECTOMY    ? APPENDECTOMY    ? BACK SURGERY    ? BREAST EXCISIONAL BIOPSY Left   ? 2000's  ? BREAST SURGERY    ? CATARACT EXTRACTION    ? Also, retina surgery  ? CHOLECYSTECTOMY    ? COLONOSCOPY N/A 05/11/2021  ? Procedure: COLONOSCOPY;  Surgeon: Toledo, Boykin Nearing, MD;  Location: [facility] ENDOSCOPY;  Service: Gastroenterology;  Laterality: N/A;  PARKINSON'S  ? EYE SURGERY    ? FRACTURE SURGERY    ? INTRAMEDULLARY (IM) NAIL INTERTROCHANTERIC Left 06/11/2021  ? Procedure: INTRAMEDULLARY (IM) NAIL INTERTROCHANTRIC;  Surgeon: Christena Flake, MD;  Location: [facility] ORS;  Service: Orthopedics;  Laterality: Left;  ? NARCOTIC PAIN PUMP IMPLANT    ? SHOULDER SURGERY    ? TONSILLECTOMY    ? TUBAL LIGATION    ? ? ?There were no vitals filed for this visit. ? ? ?SUBJECTIVE ?Chief complaint: Difficulty with ADLs, transfers, gait, balance, strength in the context of PSP diagnosis (2019) ?History: refer to subjective for details ?Red flags (bowel/bladder changes, saddle paresthesia, personal history of cancer, chills/fever, night sweats, unrelenting pain): Pt and caregiver deny red flag sx ?Directional  pattern for falls: Pt tendency to lose balance posteriorly  ?Follow-up appointment with MD: Yes April ?Dominant hand: right ?Falls in the last 6 months: Yes  ? ?OBJECTIVE ? ?Posture ?Thoracic kyphosis, rounded shoulders ? ? ?Gait ?Decreased B step length, shuffling, gait appears antalgic (L hip affected), decreased gait speed (see ), unsteady, uses U-Walker ? ? ?Strength ?R/L ?Grossly 4/5 BLEs (greatest deficits in hip musculature) ? ?Balance: ? ?Seated: pt able to correct against manually applied resistance all directions without LOB ? ?Standing balance: poor, pt requires intermittent UE support with static balance, requires UE support with dynamic ? ?OCULOMOTOR: ? ?Smooth pursuits: impaired, saccadic, unable to track finger ?Saccades: impaired, decreased speed ? ? ?Mental Status ?Per spouse, pt with impaired ability to respond to questions/impaired cognition. Pt tendency to quickly say "yes" to questions, but will change answer if slows down to consider question. Poor historian.  ? ?Sensation ?Appears grossly intact to light touch bilateral UEs/LEs, however, pt with difficulty following cues ? ? ?Coordination/Cerebellar ?Finger to Nose: decreased speed but accurate ?Heel to Shin: decreased speed but accurate  ?Rapid alternating movements: abnormal ? ? ?FUNCTIONAL OUTCOME MEASURES ? ? Results Comments  ?TUG 49  sec Indicates fall risk  ?5TSTS 29 seconds Indicates fall risk, decreased BLE power  ?10 Meter Gait Speed 0.32 m/s Below normative values for full community ambulation  ?FOTO 21 Goal score of 39  ? ? ?Interventions: ?Technique with STS,  cuing for large amplitude movement and trunk/LE/UE positioning ?Instruction and cuing for large amplitude movement to improve step-length/gait speed ?Instruction in safe technique with walker and with transfers. ? ?Provided HEP: ?Access Code: [email or web address] ?URL: [email or web address] ?Date: 12/21/2021 ?Prepared by: Temple Pacini ? ?Exercises ?Sit to Stand with  Counter Support - 1 x daily - 5 x weekly - 2 sets - 10 reps ? ? ?ASSESSMENT ?Clinical Impression: Pt is a pleasant 76 year-old female referred for PSP with difficulties with balance, gait, ADLs, and tranfers. PT examination reveals deficits in gait speed/ability, balance and strength. Pt generally mobility is impaired, movement is hypokinetic and bradykinetic. Pt also with chronic L hip pain. Pt presents with deficits in strength, gait and balance and will benefit from skilled PT services to address these deficits, improve balance, and decrease risk for future falls.  ? ? ?PLAN ?Next Visit: ?HEP:  ? ? ?Pt will be independent with HEP in order to improve strength and balance in order to decrease fall risk and improve function at home and work.  ? ?Pt will improve BERG by at least 3 points in order to demonstrate clinically significant improvement in balance.   ? ?Pt will improve DGI by at least 3 points in order to demonstrate clinically significant improvement in balance and decreased risk for falls. ? ?Pt will improve ABC by at least 13% in order to demonstrate clinically significant improvement in balance confidence.  ? ?Pt will decrease 5TSTS by at least 3 seconds in order to demonstrate clinically significant improvement in LE strength. ? ?Pt will decrease TUG to below 14 seconds/decrease in order to demonstrate decreased fall risk. ? ?Pt will decrease DHI score by at least 18 points in order to demonstrate clinically significant reduction in disability  ? ?Pt will increase by at least 6m (19ft) in order to demonstrate clinically significant improvement in cardiopulmonary endurance and community ambulation  ? ? ? ? ? ? ? ? ? ? ? ? ? ? ? ?Objective measurements completed on examination: See above findings.  ? ? ? ? ? ? ? ? ? ? ? ? ? ? ? ? ? ? ? ? ? ? ? ? ? ? ? ? ?Patient will benefit from skilled therapeutic intervention in order to improve the following deficits and impairments:    ? ?Visit Diagnosis: ?No  diagnosis found. ? ? ? ? ?Problem List ?Patient Active Problem List  ? Diagnosis Date Noted  ? Hip fracture (HCC) 06/11/2021  ? Confusion 06/21/2017  ? ? ?Baird Kay, PT ?12/21/2021, 3:25 PM ? ?[place] ?[facility] ?[address] ?[place], [place], [address] ?Phone: [phone]   Fax:  [phone] ? ?Name: SHERY WAUNEKA ?MRN: [id number] ?Date of Birth: [birth year] ? ? ?

## 2021-12-22 NOTE — Therapy (Signed)
[facility] MAIN [facility] SERVICES [address] [place], [place], [address] Phone: [phone]   Fax:  [phone]  Physical Therapy Evaluation  Patient Details  Name: Tina Bowman MRN: [id number] Date of Birth: 02-Oct-1946 Referring Provider (PT): Peyton Bottoms, MD   Encounter Date: 12/21/2021   PT End of Session - 12/22/21 0813     Visit Number 1    Number of Visits 25    Date for PT Re-Evaluation 03/15/22    Authorization Time Period 12/21/2021-03/15/2022    PT Start Time 1526    PT Stop Time 1636    PT Time Calculation (min) 70 min    Equipment Utilized During Treatment Gait belt    Activity Tolerance Patient tolerated treatment well    Behavior During Therapy WFL for tasks assessed/performed             Past Medical History:  Diagnosis Date   Arthritis    Asthma    COPD (chronic obstructive pulmonary disease) (HCC)    Fibromyalgia    Heart disease    Hypertension    Parkinsonism (HCC)    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   Sleep apnea    Slurred speech    foggy minded    Past Surgical History:  Procedure Laterality Date   ABDOMINAL HYSTERECTOMY     APPENDECTOMY     BACK SURGERY     BREAST EXCISIONAL BIOPSY Left    2000's   BREAST SURGERY     CATARACT EXTRACTION     Also, retina surgery   CHOLECYSTECTOMY     COLONOSCOPY N/A 05/11/2021   Procedure: COLONOSCOPY;  Surgeon: Toledo, Boykin Nearing, MD;  Location: [facility] ENDOSCOPY;  Service: Gastroenterology;  Laterality: N/A;  PARKINSON'S   EYE SURGERY     FRACTURE SURGERY     INTRAMEDULLARY (IM) NAIL INTERTROCHANTERIC Left 06/11/2021   Procedure: INTRAMEDULLARY (IM) NAIL INTERTROCHANTRIC;  Surgeon: Christena Flake, MD;  Location: [facility] ORS;  Service: Orthopedics;  Laterality: Left;   NARCOTIC PAIN PUMP IMPLANT     SHOULDER SURGERY      TONSILLECTOMY     TUBAL LIGATION      There were no vitals filed for this visit.    Subjective Assessment - 12/21/21 1526     Subjective Pt is a 76 y/o female who presents to PT evaluation with her spouse. Pt ambulating with U-walker, and her spouse is providing CGA with gait belt donned. Per spouse, pt diagnosed with progressive supranuclear palsy in 2019. Pt was going to attend outpatient PT earlier last year, but fell in August 2022 and fractured L hip (s/p IMN 06/11/2021). Pt spouse reports fx is fully healed and she has no current restrictions. Pt spent 32 days in rehab following fx and then received HH PT.  Pt current symptoms include unsteadiness, impaired gait, decreased strength and L hip pain. Pt spouse reports pt has had 1 fall in last 6 months. Pt tends to lose her balance posteriorly. Pt requires assist for majority of ADLs: dressing, bathing, meal prep. Her spouse does state, "I let her do as much as she can possibly do." Pt can still eat independently and denies difficulties with swallowing. Pt reports her vision is affected. She is constantly dizzy (per spouse, this was one of pt's first symptoms of PSP). In addition to U-walker, pt has  lift chair and recliner at home. Pt has difficulty getting up her ramp to enter her home so her spouse assists her.    Patient is accompained by: Family member   spouse   Pertinent History Pt is a 76 y/o female who presents to PT evaluation with her spouse. Pt ambulating with U-walker, and her spouse is providing CGA with gait belt donned. Per spouse, pt diagnosed with progressive supranuclear palsy in 2019. Pt was going to attend outpatient PT earlier last year, but fell in August 2022 and fractured L hip (s/p IMN 06/11/2021). Pt spouse reports fx is fully healed and she has no current restrictions. Pt spent 32 days in rehab following fx and then received HH PT.  Pt current symptoms include unsteadiness, impaired gait, decreased strength and L hip pain.  Pt spouse reports pt has had 1 fall in last 6 months. Pt tends to lose her balance posteriorly. Pt requires assist for majority of ADLs: dressing, bathing, meal prep. Her spouse does state, "I let her do as much as she can possibly do." Pt can still eat independently and denies difficulties with swallowing. Pt reports her vision is affected. She is constantly dizzy (per spouse, this was one of pt's first symptoms of PSP). In addition to U-walker, pt has lift chair and recliner at home. Pt has difficulty getting up her ramp to enter her home so her spouse assists her. Per chart other PMH includes: sleep apnea, HTN, COPD, CAD, fibromyalgia, early dementia, displaced L intertrochanteric femur fracture.    Limitations Standing;Walking;House hold activities;Lifting;Writing;Reading    How long can you sit comfortably? not limited    How long can you stand comfortably? 15 minutes, pt reports "I feel unsteady." Pt must always hold on to something    How long can you walk comfortably? Pt fatigues quickly.    Diagnostic tests refer to imaging from Aug 2022 per chart    Patient Stated Goals Pt would like to improve walking, balance, gait    Currently in Pain? Yes    Pain Score 5     Pain Location Hip    Pain Orientation Left    Pain Onset More than a month ago                [facility] - 12/21/21 1546       Assessment   Medical Diagnosis Atypical Parkinsonism    Referring Provider (PT) Peyton BottomsMitchell, Kyle Todd, MD    Onset Date/Surgical Date --   dx'd 2019   Hand Dominance Right      Precautions   Precautions Fall      Restrictions   Weight Bearing Restrictions No   none per pt and caregiver     Balance Screen   Has the patient fallen in the past 6 months Yes      Home Environment   Living Environment Private residence    Living Arrangements Spouse/significant other    Available Help at Discharge Family    Type of Home House    Home Access Ramped entrance    Home Layout One level     Home Equipment Bedside commode;Shower seat;Grab bars - tub/shower;Toilet riser;Transport chair;Wheelchair - [employer]manual;Walker - 4 wheels      Prior Function   Level of Independence Independent              SUBJECTIVE Chief complaint: Difficulty with ADLs, transfers, gait, balance, strength in the context of PSP diagnosis (2019) History: refer to subjective for  details Red flags (bowel/bladder changes, saddle paresthesia, personal history of cancer, chills/fever, night sweats, unrelenting pain): Pt and caregiver deny red flag sx Directional pattern for falls: Pt tendency to lose balance posteriorly  Follow-up appointment with MD: Yes April Dominant hand: right Falls in the last 6 months: Yes   OBJECTIVE  Posture Thoracic kyphosis, rounded shoulders   Gait Decreased B step length, shuffling, gait appears antalgic (L hip affected), decreased gait speed (see ), unsteady, uses U-Walker   Strength R/L Grossly 4/5 BLEs (greatest deficits in hip musculature)  Balance:  Seated: pt able to correct against manually applied resistance all directions without LOB  Standing balance: poor, pt requires intermittent UE support with static balance, requires UE support with dynamic  OCULOMOTOR:  Smooth pursuits: impaired, saccadic, unable to track finger Saccades: impaired, decreased speed   Mental Status Per spouse, pt with impaired ability to respond to questions/impaired cognition. Pt tendency to quickly say "yes" to questions, but will change answer if slows down to consider question. Poor historian.   Sensation Appears grossly intact to light touch bilateral UEs/LEs, however, pt with difficulty following cues   Coordination/Cerebellar Finger to Nose: decreased speed but accurate Heel to Shin: decreased speed but accurate  Rapid alternating movements: abnormal   FUNCTIONAL OUTCOME MEASURES   Results Comments  TUG 49  sec Indicates fall risk  5TSTS 29 seconds  Indicates fall risk, decreased BLE power  10 Meter Gait Speed 0.32 m/s Below normative values for full community ambulation  FOTO 21 Goal score of 39    Interventions: Technique with STS, cuing for large amplitude movement and trunk/LE/UE positioning Instruction and cuing for large amplitude movement to improve step-length/gait speed Instruction in safe technique with walker and with transfers.  Provided HEP: Access Code: [email or web address] URL: [email or web address] Date: 12/21/2021 Prepared by: Temple Pacini  Exercises Sit to Stand with Counter Support - 1 x daily - 5 x weekly - 2 sets - 10 reps   ASSESSMENT Clinical Impression: Pt is a pleasant 76 year-old female referred for PSP with c/o difficulties with balance, gait, ADLs, and tranfers. PT examination reveals deficits in gait speed/ability, balance, coordination and strength. Pt general mobility is impaired, movement is hypokinetic and bradykinetic. Pt also with chronic L hip pain. The pt will benefit from skilled PT services to address these deficits, improve balance, gait, strength and decrease risk for future falls.      PT Education - 12/22/21 0813     Education Details Exam/indications for PT, goals, POC    Person(s) Educated Patient;Spouse    Methods Explanation;Demonstration;Tactile cues;Verbal cues;Handout    Comprehension Verbalized understanding;Returned demonstration;Verbal cues required;Tactile cues required              PT Short Term Goals - 12/21/21 1551       PT SHORT TERM GOAL #1   Title Pt will be independent with HEP in order to improve strength and balance in order to decrease fall risk and improve function at home and work.    Baseline 3/1: issued. focus on large amplitude STS (with spouse providing CGA)    Time 6    Period Weeks    Status New    Target Date 02/01/22      PT SHORT TERM GOAL #2   Title --    Baseline --    Time --    Period --    Status --    Target Date --      PT  SHORT TERM GOAL #  3   Title --    Baseline --    Time --    Period --    Status --    Target Date --      PT SHORT TERM GOAL #4   Title --    Time --    Period --    Status --    Target Date --      PT SHORT TERM GOAL #5   Title --    Baseline --    Time --    Period --    Status --    Target Date --               PT Long Term Goals - 12/21/21 1553       PT LONG TERM GOAL #1   Title Pt will decrease 5TSTS by at least 3 seconds in order to demonstrate clinically significant improvement in LE strength.    Baseline 3/1: 29 sec with use of UEs to assist    Time 12    Period Weeks    Status New    Target Date 03/15/22      PT LONG TERM GOAL #2   Title Pt will decrease TUG to below 14 seconds/decrease in order to demonstrate decreased fall risk.    Baseline 3/1: 49 sec    Time 12    Period Weeks    Status New    Target Date 03/15/22      PT LONG TERM GOAL #3   Title Patient will increase BLE gross strength to 4+/5 as to improve functional strength for independent gait, increased standing tolerance and increased ADL ability.    Baseline 3/1: 4/5 in BLEs    Time 12    Period Weeks    Status New    Target Date 03/15/22      PT LONG TERM GOAL #4   Title Patient will increase FOTO score to equal to or greater than  39   to demonstrate statistically significant improvement in mobility and quality of life.    Baseline 3/1: 21    Time 12    Period Weeks    Status New    Target Date 03/15/22      PT LONG TERM GOAL #5   Title Patient will increase 10 meter walk test to >0.8 m/s as to improve gait speed for better community ambulation and to reduce fall risk.    Baseline 3/1: 0.32 with u-step walker    Time 12    Period Weeks    Status New    Target Date 03/15/22                    Plan - 12/21/21 1555     Clinical Impression Statement Pt is a pleasant 76 year-old female referred for PSP with c/o difficulties with balance, gait, ADLs, and tranfers.  PT examination reveals deficits in gait speed/ability, balance, coordination and strength. Pt general mobility is impaired, movement is hypokinetic and bradykinetic. Pt also with chronic L hip pain. The pt will benefit from skilled PT services to address these deficits, improve balance, gait, strength and decrease risk for future falls.    Personal Factors and Comorbidities Age;Comorbidity 3+;Fitness;Sex;Time since onset of injury/illness/exacerbation    Comorbidities Sleep apnea, HTN, COPD, CAD, fibromyalgia, early dementia, displaced L intertrochanteric femur fracutre    Examination-Activity Limitations Bathing;Carry;Lift;Stand;Locomotion Level;Toileting;Bend;Dressing;Reach Overhead;Squat;Transfers;Caring for Others;Hygiene/Grooming;Stairs;Bed Mobility    Examination-Participation Restrictions Driving;Medication Management;Cleaning;Community Activity;Laundry;Shop;Yard Work;Meal Prep;Personal [employer]  Stability/Clinical Decision Making Evolving/Moderate complexity    Clinical Decision Making High    Rehab Potential Fair    PT Frequency 2x / week    PT Duration 12 weeks    PT Treatment/Interventions ADLs/Self Care Home Management;Aquatic Therapy;Biofeedback;Canalith Repostioning;Cryotherapy;Electrical Stimulation;Moist Heat;Ultrasound;Parrafin;Contrast Bath;DME Instruction;Gait training;Stair training;Functional mobility training;Therapeutic activities;Therapeutic exercise;Balance training;Neuromuscular re-education;Cognitive remediation;Patient/family education;Orthotic Fit/Training;Wheelchair mobility training;Manual techniques;Passive range of motion;Energy conservation;Splinting;Taping;Vestibular;Visual/perceptual remediation/compensation;Joint Manipulations    PT Next Visit Plan strengthening, balance, gait    PT Home Exercise Plan Access Code: [email or web address] to be advanced    Consulted and Agree with Plan of Care Patient;Family member/caregiver    Family Member Consulted spouse              Patient will benefit from skilled therapeutic intervention in order to improve the following deficits and impairments:  Abnormal gait, Decreased activity tolerance, Decreased endurance, Decreased strength, Hypomobility, Dizziness, Pain, Improper body mechanics, Impaired sensation, Decreased balance, Decreased coordination, Decreased mobility, Postural dysfunction, Difficulty walking, Impaired UE functional use, Decreased range of motion  Visit Diagnosis: Unsteadiness on feet  Muscle weakness (generalized)  Other abnormalities of gait and mobility  Other lack of coordination  Pain in left hip     Problem List Patient Active Problem List   Diagnosis Date Noted   Hip fracture (HCC) 06/11/2021   Confusion 06/21/2017    Baird Kay, PT 12/22/2021, 5:16 PM  [facility] MAIN [facility] SERVICES [address] [place], [place], [address] Phone: [phone]   Fax:  [phone]  Name: Tina Bowman MRN: [id number] Date of Birth: 05-29-1946

## 2021-12-26 ENCOUNTER — Ambulatory Visit: Payer: Medicare HMO | Admitting: Physical Therapy

## 2021-12-26 ENCOUNTER — Other Ambulatory Visit: Payer: Self-pay

## 2021-12-26 ENCOUNTER — Encounter: Payer: Self-pay | Admitting: Physical Therapy

## 2021-12-26 DIAGNOSIS — M25552 Pain in left hip: Secondary | ICD-10-CM

## 2021-12-26 DIAGNOSIS — R2681 Unsteadiness on feet: Secondary | ICD-10-CM | POA: Diagnosis not present

## 2021-12-26 DIAGNOSIS — M6281 Muscle weakness (generalized): Secondary | ICD-10-CM

## 2021-12-26 DIAGNOSIS — R262 Difficulty in walking, not elsewhere classified: Secondary | ICD-10-CM

## 2021-12-26 DIAGNOSIS — R2689 Other abnormalities of gait and mobility: Secondary | ICD-10-CM

## 2021-12-26 DIAGNOSIS — R278 Other lack of coordination: Secondary | ICD-10-CM

## 2021-12-26 NOTE — Therapy (Signed)
[place] MAIN [facility] SERVICES [address] [place], [place], [address] Phone: [phone]   Fax:  [phone]  Physical Therapy Treatment  Patient Details  Name: Tina Bowman MRN: [id number] Date of Birth: 1946/08/24 Referring Provider (PT): Luella Cook, MD   Encounter Date: 12/26/2021   PT End of Session - 12/26/21 1253     Visit Number 2    Number of Visits 25    Date for PT Re-Evaluation 03/15/22    Authorization Time Period 12/21/2021-03/15/2022    PT Start Time 1302    PT Stop Time 1345    PT Time Calculation (min) 43 min    Equipment Utilized During Treatment Gait belt    Activity Tolerance Patient tolerated treatment well    Behavior During Therapy WFL for tasks assessed/performed             Past Medical History:  Diagnosis Date   Arthritis    Asthma    COPD (chronic obstructive pulmonary disease) ([place])    Fibromyalgia    Heart disease    Hypertension    Parkinsonism ([place])    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   Sleep apnea    Slurred speech    foggy minded    Past Surgical History:  Procedure Laterality Date   ABDOMINAL HYSTERECTOMY     APPENDECTOMY     BACK SURGERY     BREAST EXCISIONAL BIOPSY Left    2000's   BREAST SURGERY     CATARACT EXTRACTION     Also, retina surgery   CHOLECYSTECTOMY     COLONOSCOPY N/A 05/11/2021   Procedure: COLONOSCOPY;  Surgeon: Toledo, Benay Pike, MD;  Location: [facility] ENDOSCOPY;  Service: Gastroenterology;  Laterality: N/A;  PARKINSON'S   EYE SURGERY     FRACTURE SURGERY     INTRAMEDULLARY (IM) NAIL INTERTROCHANTERIC Left 06/11/2021   Procedure: INTRAMEDULLARY (IM) NAIL INTERTROCHANTRIC;  Surgeon: Corky Mull, MD;  Location: [facility] ORS;  Service: Orthopedics;  Laterality: Left;   NARCOTIC PAIN PUMP IMPLANT     SHOULDER SURGERY      TONSILLECTOMY     TUBAL LIGATION      There were no vitals filed for this visit.   Subjective Assessment - 12/26/21 1304     Subjective Patient reports doing well. Caregiver reports they were working on sit to stands at home and they went well. She denies any hip pain currently; Denies any new falls.    Patient is accompained by: Family member   spouse   Pertinent History Pt is a 76 y/o female who presents to PT evaluation with her spouse. Pt ambulating with U-walker, and her spouse is providing CGA with gait belt donned. Per spouse, pt diagnosed with progressive supranuclear palsy in 2019. Pt was going to attend outpatient PT earlier last year, but fell in August 2022 and fractured L hip (s/p IMN 06/11/2021). Pt spouse reports fx is fully healed and she has no current restrictions. Pt spent 32 days in rehab following fx and then received HH PT.  Pt current symptoms include unsteadiness, impaired gait, decreased strength and L hip pain. Pt spouse reports pt has had 1 fall in last 6 months. Pt tends to lose her balance posteriorly. Pt requires assist for majority of ADLs: dressing, bathing, meal prep. Her spouse does state, "I let her do  as much as she can possibly do." Pt can still eat independently and denies difficulties with swallowing. Pt reports her vision is affected. She is constantly dizzy (per spouse, this was one of pt's first symptoms of PSP). In addition to U-walker, pt has lift chair and recliner at home. Pt has difficulty getting up her ramp to enter her home so her spouse assists her. Per chart other PMH includes: sleep apnea, HTN, COPD, CAD, fibromyalgia, early dementia, displaced L intertrochanteric femur fracture.    Limitations Standing;Walking;House hold activities;Lifting;Writing;Reading    How long can you sit comfortably? not limited    How long can you stand comfortably? 15 minutes, pt reports "I feel unsteady." Pt must always hold on to something    How long can you walk  comfortably? Pt fatigues quickly.    Diagnostic tests refer to imaging from Aug 2022 per chart    Patient Stated Goals Pt would like to improve walking, balance, gait    Currently in Pain? No/denies    Pain Onset More than a month ago              TREATMENT: PT instructed patient in LE strengthening  Sit<>Stand from chair x5 reps with cues for reaching forward to increase ease with transfer  Standing at steps: -hip flexion march against green tband x10 reps each LE, patient exhibits decreased foot clearance with LLE with increased difficulty; Required heavy UE rail assist and cues for large amplitude movement;  Seated: Hip abduction red tband x15 reps each LE with visual cues to increase ROM for better strengthening Hamstring curl red tband x10 reps each LE LAQ 3# x15 reps each LE, patient exhibits decreased ROM on LLE compared to RLE due to weakness;   Standing at support bar: Instructed patient in forward/backward step over 1/2 bolster x5 reps with 2 HHA and mod VCs to increase step length for better foot clearance; -side stepping over 1/2 bolster x5 reps each direction with 2 HHA on rail and mod VCS to increase step length for better foot clearance and to improve obstacle negotiation;  NMR: Standing on airex pad: Feet shoulder width apart:  Eyes open unsupported 30 sec hold x2 reps, CGA for safety  Eyes open, unsupported head turns side/side x5 reps, up/down x5 reps  Progressed with reaching with UE across body passing cone side/side x5 reps each direction  Patient required CGA to min A for balance while on airex pad. She was hesitant to reduce rail assist but was able to stand unsupported for short duration. Patient does exhibits decreased ankle strategies, often relying on trunk position and UE support for balance recovery;  She tolerated session well. She does report increased fatigue at end of session. Patient does exhibit decreased AROM on LLE compared to RLE. She also  exhibits bradykinesia and hypokinesia which limits mobility;                          PT Education - 12/26/21 1252     Education Details exercise technique/positioning;    Person(s) Educated Patient    Methods Explanation;Verbal cues    Comprehension Verbalized understanding;Returned demonstration;Verbal cues required;Need further instruction              PT Short Term Goals - 12/21/21 1551       PT SHORT TERM GOAL #1   Title Pt will be independent with HEP in order to improve strength and balance in order to decrease fall  risk and improve function at home and work.    Baseline 3/1: issued. focus on large amplitude STS (with spouse providing CGA)    Time 6    Period Weeks    Status New    Target Date 02/01/22      PT SHORT TERM GOAL #2   Title --    Baseline --    Time --    Period --    Status --    Target Date --      PT SHORT TERM GOAL #3   Title --    Baseline --    Time --    Period --    Status --    Target Date --      PT SHORT TERM GOAL #4   Title --    Time --    Period --    Status --    Target Date --      PT SHORT TERM GOAL #5   Title --    Baseline --    Time --    Period --    Status --    Target Date --               PT Long Term Goals - 12/21/21 1553       PT LONG TERM GOAL #1   Title Pt will decrease 5TSTS by at least 3 seconds in order to demonstrate clinically significant improvement in LE strength.    Baseline 3/1: 29 sec with use of UEs to assist    Time 12    Period Weeks    Status New    Target Date 03/15/22      PT LONG TERM GOAL #2   Title Pt will decrease TUG to below 14 seconds/decrease in order to demonstrate decreased fall risk.    Baseline 3/1: 49 sec    Time 12    Period Weeks    Status New    Target Date 03/15/22      PT LONG TERM GOAL #3   Title Patient will increase BLE gross strength to 4+/5 as to improve functional strength for independent gait, increased standing tolerance  and increased ADL ability.    Baseline 3/1: 4/5 in BLEs    Time 12    Period Weeks    Status New    Target Date 03/15/22      PT LONG TERM GOAL #4   Title Patient will increase FOTO score to equal to or greater than  39   to demonstrate statistically significant improvement in mobility and quality of life.    Baseline 3/1: 21    Time 12    Period Weeks    Status New    Target Date 03/15/22      PT LONG TERM GOAL #5   Title Patient will increase 10 meter walk test to >0.8 m/s as to improve gait speed for better community ambulation and to reduce fall risk.    Baseline 3/1: 0.32 with u-step walker    Time 12    Period Weeks    Status New    Target Date 03/15/22                   Plan - 12/26/21 1351     Clinical Impression Statement Patient motivated and participated well within session. She does exhibit decreased AROM on LLE compared to RLE. She exhibits significant bradykinesia and hypokinesia which limits overall mobility. Patient does  require min-mod VCs for proper positioning/exercise technique. She also required increased UE rail assist in standing for better stance control. Patient reports increased fatigue in BLE at end of session. She would benefit from additional skilled PT Intervention to improve strength, balance and mobility;    Personal Factors and Comorbidities Age;Comorbidity 3+;Fitness;Sex;Time since onset of injury/illness/exacerbation    Comorbidities Sleep apnea, HTN, COPD, CAD, fibromyalgia, early dementia, displaced L intertrochanteric femur fracutre    Examination-Activity Limitations Bathing;Carry;Lift;Stand;Locomotion Level;Toileting;Bend;Dressing;Reach Overhead;Squat;Transfers;Caring for Others;Hygiene/Grooming;Stairs;Bed Mobility    Examination-Participation Restrictions Driving;Medication Management;Cleaning;Community Activity;Laundry;Shop;Yard Work;Meal Prep;Personal Finances    Stability/Clinical Decision Making Evolving/Moderate complexity     Rehab Potential Fair    PT Frequency 2x / week    PT Duration 12 weeks    PT Treatment/Interventions ADLs/Self Care Home Management;Aquatic Therapy;Biofeedback;Canalith Repostioning;Cryotherapy;Electrical Stimulation;Moist Heat;Ultrasound;Parrafin;Contrast Bath;DME Instruction;Gait training;Stair training;Functional mobility training;Therapeutic activities;Therapeutic exercise;Balance training;Neuromuscular re-education;Cognitive remediation;Patient/family education;Orthotic Fit/Training;Wheelchair mobility training;Manual techniques;Passive range of motion;Energy conservation;Splinting;Taping;Vestibular;Visual/perceptual remediation/compensation;Joint Manipulations    PT Next Visit Plan strengthening, balance, gait    PT Home Exercise Plan Access Code: [email or web address] to be advanced    Consulted and Agree with Plan of Care Patient;Family member/caregiver    Family Member Consulted spouse             Patient will benefit from skilled therapeutic intervention in order to improve the following deficits and impairments:  Abnormal gait, Decreased activity tolerance, Decreased endurance, Decreased strength, Hypomobility, Dizziness, Pain, Improper body mechanics, Impaired sensation, Decreased balance, Decreased coordination, Decreased mobility, Postural dysfunction, Difficulty walking, Impaired UE functional use, Decreased range of motion  Visit Diagnosis: Unsteadiness on feet  Muscle weakness (generalized)  Other abnormalities of gait and mobility  Other lack of coordination  Pain in left hip  Difficulty in walking, not elsewhere classified     Problem List Patient Active Problem List   Diagnosis Date Noted   Hip fracture ([place]) 06/11/2021   Confusion 06/21/2017    Charliee Krenz, PT, DPT 12/26/2021, 1:55 PM  [place] MAIN [facility] SERVICES [address] [place], [place], [address] Phone: [phone]   Fax:  [phone]  Name: Tina Bowman MRN: [id number] Date of Birth: Aug 05, 1946

## 2021-12-28 ENCOUNTER — Ambulatory Visit: Payer: Medicare HMO | Admitting: Physical Therapy

## 2021-12-30 ENCOUNTER — Ambulatory Visit: Payer: Medicare HMO | Admitting: Physical Therapy

## 2021-12-30 ENCOUNTER — Other Ambulatory Visit: Payer: Self-pay

## 2021-12-30 DIAGNOSIS — R2689 Other abnormalities of gait and mobility: Secondary | ICD-10-CM

## 2021-12-30 DIAGNOSIS — M6281 Muscle weakness (generalized): Secondary | ICD-10-CM

## 2021-12-30 DIAGNOSIS — R2681 Unsteadiness on feet: Secondary | ICD-10-CM | POA: Diagnosis not present

## 2021-12-30 NOTE — Therapy (Signed)
[place] [facility] MAIN [facility] SERVICES [address] [place], [place], [address] Phone: [phone]   Fax:  [phone]  Physical Therapy Treatment  Patient Details  Name: Tina Bowman MRN: [id number] Date of Birth: April 23, 1946 Referring Provider (PT): Peyton Bottoms, MD   Encounter Date: 12/30/2021   PT End of Session - 12/30/21 0959     Visit Number 3    Number of Visits 25    Date for PT Re-Evaluation 03/15/22    Authorization Time Period 12/21/2021-03/15/2022    PT Start Time 1000    PT Stop Time 1041    PT Time Calculation (min) 41 min    Equipment Utilized During Treatment Gait belt    Activity Tolerance Patient tolerated treatment well    Behavior During Therapy WFL for tasks assessed/performed             Past Medical History:  Diagnosis Date   Arthritis    Asthma    COPD (chronic obstructive pulmonary disease) ([facility])    Fibromyalgia    Heart disease    Hypertension    Parkinsonism ([facility])    Progressive supranuclear palsy ([facility]) 05/23/2018   Pt reported this dx has been recinded by the MD at the [facility] disorders clinic, and replaced with Atypical Parkinsonism..   Sleep apnea    Slurred speech    foggy minded    Past Surgical History:  Procedure Laterality Date   ABDOMINAL HYSTERECTOMY     APPENDECTOMY     BACK SURGERY     BREAST EXCISIONAL BIOPSY Left    2000's   BREAST SURGERY     CATARACT EXTRACTION     Also, retina surgery   CHOLECYSTECTOMY     COLONOSCOPY N/A 05/11/2021   Procedure: COLONOSCOPY;  Surgeon: Toledo, Boykin Nearing, MD;  Location: [facility] ENDOSCOPY;  Service: Gastroenterology;  Laterality: N/A;  PARKINSON'S   EYE SURGERY     FRACTURE SURGERY     INTRAMEDULLARY (IM) NAIL INTERTROCHANTERIC Left 06/11/2021   Procedure: INTRAMEDULLARY (IM) NAIL INTERTROCHANTRIC;  Surgeon: Christena Flake, MD;  Location: [facility] ORS;  Service: Orthopedics;  Laterality: Left;   NARCOTIC PAIN PUMP IMPLANT     SHOULDER SURGERY      TONSILLECTOMY     TUBAL LIGATION      There were no vitals filed for this visit.   Subjective Assessment - 12/30/21 1200     Subjective Patient and caregiver report they are doing well with no significant changes since last session.    Patient is accompained by: Family member   spouse   Pertinent History Pt is a 76 y/o female who presents to PT evaluation with her spouse. Pt ambulating with U-walker, and her spouse is providing CGA with gait belt donned. Per spouse, pt diagnosed with progressive supranuclear palsy in 2019. Pt was going to attend outpatient PT earlier last year, but fell in August 2022 and fractured L hip (s/p IMN 06/11/2021). Pt spouse reports fx is fully healed and she has no current restrictions. Pt spent 32 days in rehab following fx and then received HH PT.  Pt current symptoms include unsteadiness, impaired gait, decreased strength and L hip pain. Pt spouse reports pt has had 1 fall in last 6 months. Pt tends to lose her balance posteriorly. Pt requires assist for majority of ADLs: dressing, bathing, meal prep. Her spouse does state, "I let her do as much as she can possibly do." Pt can still eat independently and denies  difficulties with swallowing. Pt reports her vision is affected. She is constantly dizzy (per spouse, this was one of pt's first symptoms of PSP). In addition to U-walker, pt has lift chair and recliner at home. Pt has difficulty getting up her ramp to enter her home so her spouse assists her. Per chart other PMH includes: sleep apnea, HTN, COPD, CAD, fibromyalgia, early dementia, displaced L intertrochanteric femur fracture.    Limitations Standing;Walking;House hold activities;Lifting;Writing;Reading    How long can you sit comfortably? not limited    How long can you stand comfortably? 15 minutes, pt reports "I feel unsteady." Pt must always hold on to something    How long can you walk comfortably? Pt fatigues quickly.    Diagnostic tests refer to imaging  from Aug 2022 per chart    Patient Stated Goals Pt would like to improve walking, balance, gait    Currently in Pain? No/denies    Pain Onset More than a month ago               Exercise/Activity Sets/Reps/Time/ Resistance Assistance Charge type Comments  Nustep  Level 2 x 5 min  Min fo rgettting on and off and for UE and LE positioning  Therex  Min A getting situated on nnustep, cues for cadence > 60 SPM.   LAQ 2 x 10 x 2# AW   TherEX Increased difficulty on the Left compared to the right  Cues for full arc of motion   Hip ABDuction  2 x 15 RTB   TherEX Tactile cues for proper performance  Hip Adduction squeeze seated  2 x 10 with ball between legs   There ex Physical therapist physical assist with keeping ball in place between repetitions  Standing marching  X 10 ea side  B UE  Therex  Cues for large movements   Standing on airex pad - passing cone,   No upper extremity Neuromuscular reeducation Cues for utilization of both upper extremities to implement proper trunk rotation no loss of balance noted  Standing step over 1/2 foam  lateral step overs x 6 on ea side   B UE  There ex Consistent cues to prevent shuffling gait to get closer to foam pad.  Patient tends to hit pad on step over portion without proper foot clearance on opposite side of pad.  Consistent verbal cues for proper performance of this exercise  Standing on Airex pad at self-selected stance  No upper extremity Neuromuscular reeducation Cues to put hands by side to prevent consistent upper extremity utilization.  No loss of balance noted with proper hand positioning by her sides                    Treatment Provided this session   Pt educated throughout session about proper posture and technique with exercises. Improved exercise technique, movement at target joints, use of target muscles after min to mod verbal, visual, tactile cues. Note: Portions of this document were prepared using Dragon voice recognition software  and although reviewed may contain unintentional dictation errors in syntax, grammar, or spelling.                          PT Education - 12/30/21 0958     Education Details exercise form and technique    Person(s) Educated Patient    Methods Explanation    Comprehension Verbalized understanding  PT Short Term Goals - 12/21/21 1551       PT SHORT TERM GOAL #1   Title Pt will be independent with HEP in order to improve strength and balance in order to decrease fall risk and improve function at home and work.    Baseline 3/1: issued. focus on large amplitude STS (with spouse providing CGA)    Time 6    Period Weeks    Status New    Target Date 02/01/22      PT SHORT TERM GOAL #2   Title --    Baseline --    Time --    Period --    Status --    Target Date --      PT SHORT TERM GOAL #3   Title --    Baseline --    Time --    Period --    Status --    Target Date --      PT SHORT TERM GOAL #4   Title --    Time --    Period --    Status --    Target Date --      PT SHORT TERM GOAL #5   Title --    Baseline --    Time --    Period --    Status --    Target Date --               PT Long Term Goals - 12/21/21 1553       PT LONG TERM GOAL #1   Title Pt will decrease 5TSTS by at least 3 seconds in order to demonstrate clinically significant improvement in LE strength.    Baseline 3/1: 29 sec with use of UEs to assist    Time 12    Period Weeks    Status New    Target Date 03/15/22      PT LONG TERM GOAL #2   Title Pt will decrease TUG to below 14 seconds/decrease in order to demonstrate decreased fall risk.    Baseline 3/1: 49 sec    Time 12    Period Weeks    Status New    Target Date 03/15/22      PT LONG TERM GOAL #3   Title Patient will increase BLE gross strength to 4+/5 as to improve functional strength for independent gait, increased standing tolerance and increased ADL ability.    Baseline 3/1: 4/5  in BLEs    Time 12    Period Weeks    Status New    Target Date 03/15/22      PT LONG TERM GOAL #4   Title Patient will increase FOTO score to equal to or greater than  39   to demonstrate statistically significant improvement in mobility and quality of life.    Baseline 3/1: 21    Time 12    Period Weeks    Status New    Target Date 03/15/22      PT LONG TERM GOAL #5   Title Patient will increase 10 meter walk test to >0.8 m/s as to improve gait speed for better community ambulation and to reduce fall risk.    Baseline 3/1: 0.32 with u-step walker    Time 12    Period Weeks    Status New    Target Date 03/15/22                   Plan -  12/30/21 0959     Clinical Impression Statement Patient motivated and rested well within session.  Patient did require consistent verbal and tactile cues for maintenance of focus on task at hand.  Patient needs to demonstrate decreased active range of motion on the left lower extremity with all exercises in today's session.  Patient also has difficulty with eccentric portion of sit to stand and tends to sit uncontrollably despite reaching back and finding chair arms with hands.  We will continue to work on this in future sessions to improve safety with these transitions.  Patient was able to progress with standing on Airex pad without upper extremity support this session required consistent cueing to prevent upper extremity utilization.  Patient will continue to benefit from skilled physical therapy intervention in order to improve her strength, balance, mobility, and decrease her risk of falls.    Personal Factors and Comorbidities Age;Comorbidity 3+;Fitness;Sex;Time since onset of injury/illness/exacerbation    Comorbidities Sleep apnea, HTN, COPD, CAD, fibromyalgia, early dementia, displaced L intertrochanteric femur fracutre    Examination-Activity Limitations Bathing;Carry;Lift;Stand;Locomotion Level;Toileting;Bend;Dressing;Reach  Overhead;Squat;Transfers;Caring for Others;Hygiene/Grooming;Stairs;Bed Mobility    Examination-Participation Restrictions Driving;Medication Management;Cleaning;Community Activity;Laundry;Shop;Yard Work;Meal Prep;Personal Finances    Stability/Clinical Decision Making Evolving/Moderate complexity    Rehab Potential Fair    PT Frequency 2x / week    PT Duration 12 weeks    PT Treatment/Interventions ADLs/Self Care Home Management;Aquatic Therapy;Biofeedback;Canalith Repostioning;Cryotherapy;Electrical Stimulation;Moist Heat;Ultrasound;Parrafin;Contrast Bath;DME Instruction;Gait training;Stair training;Functional mobility training;Therapeutic activities;Therapeutic exercise;Balance training;Neuromuscular re-education;Cognitive remediation;Patient/family education;Orthotic Fit/Training;Wheelchair mobility training;Manual techniques;Passive range of motion;Energy conservation;Splinting;Taping;Vestibular;Visual/perceptual remediation/compensation;Joint Manipulations    PT Next Visit Plan strengthening, balance, gait    PT Home Exercise Plan Access Code: [email or web address] to be advanced    Consulted and Agree with Plan of Care Patient;Family member/caregiver    Family Member Consulted spouse             Patient will benefit from skilled therapeutic intervention in order to improve the following deficits and impairments:  Abnormal gait, Decreased activity tolerance, Decreased endurance, Decreased strength, Hypomobility, Dizziness, Pain, Improper body mechanics, Impaired sensation, Decreased balance, Decreased coordination, Decreased mobility, Postural dysfunction, Difficulty walking, Impaired UE functional use, Decreased range of motion  Visit Diagnosis: Unsteadiness on feet  Muscle weakness (generalized)  Other abnormalities of gait and mobility     Problem List Patient Active Problem List   Diagnosis Date Noted   Hip fracture (HCC) 06/11/2021   Confusion 06/21/2017    Norman Herrlich,  PT 12/30/2021, 12:02 PM  [place] [facility] MAIN [facility] SERVICES [address] [place], [place], [address] Phone: [phone]   Fax:  [phone]  Name: Tina Bowman MRN: [id number] Date of Birth: Oct 10, 1946

## 2022-01-04 ENCOUNTER — Other Ambulatory Visit: Payer: Self-pay

## 2022-01-04 ENCOUNTER — Ambulatory Visit: Payer: Medicare HMO | Admitting: Physical Therapy

## 2022-01-04 ENCOUNTER — Encounter: Payer: Self-pay | Admitting: Physical Therapy

## 2022-01-04 DIAGNOSIS — R2681 Unsteadiness on feet: Secondary | ICD-10-CM | POA: Diagnosis not present

## 2022-01-04 NOTE — Therapy (Signed)
[place] ?[facility] ?[address] ?[place], [place], [address] ?Phone: [phone]   Fax:  [phone] ? ?Physical Therapy Treatment ? ?Patient Details  ?Name: Tina Bowman ?MRN: [id number] ?Date of Birth: 04/16/1946 ?Referring Provider (PT): Peyton Bottoms, MD ? ? ?Encounter Date: 01/04/2022 ? ? PT End of Session - 01/04/22 1255   ? ? Visit Number 4   ? Number of Visits 25   ? Date for PT Re-Evaluation 03/15/22   ? Authorization Time Period 12/21/2021-03/15/2022   ? PT Start Time 1300   ? PT Stop Time 1345   ? PT Time Calculation (min) 45 min   ? Equipment Utilized During Treatment Gait belt   ? Activity Tolerance Patient tolerated treatment well   ? Behavior During Therapy [facility] for tasks assessed/performed   ? ?  ?  ? ?  ? ? ?Past Medical History:  ?Diagnosis Date  ? Arthritis   ? Asthma   ? COPD (chronic obstructive pulmonary disease) (HCC)   ? Fibromyalgia   ? Heart disease   ? Hypertension   ? Parkinsonism (HCC)   ? 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 Pt reported this dx has been recinded by the MD at the [facility] disorders clinic, and replaced with Atypical Parkinsonism..  ? Sleep apnea   ? Slurred speech   ? foggy minded  ? ? ?Past Surgical History:  ?Procedure Laterality Date  ? ABDOMINAL HYSTERECTOMY    ? APPENDECTOMY    ? BACK SURGERY    ? BREAST EXCISIONAL BIOPSY Left   ? 2000's  ? BREAST SURGERY    ? CATARACT EXTRACTION    ? Also, retina surgery  ? CHOLECYSTECTOMY    ? COLONOSCOPY N/A 05/11/2021  ? Procedure: COLONOSCOPY;  Surgeon: Toledo, Boykin Nearing, MD;  Location: [facility] ENDOSCOPY;  Service: Gastroenterology;  Laterality: N/A;  PARKINSON'S  ? EYE SURGERY    ? FRACTURE SURGERY    ? INTRAMEDULLARY (IM) NAIL INTERTROCHANTERIC Left 06/11/2021  ? Procedure: INTRAMEDULLARY (IM) NAIL INTERTROCHANTRIC;  Surgeon: Christena Flake, MD;  Location: [facility] ORS;  Service: Orthopedics;  Laterality: Left;  ? NARCOTIC PAIN PUMP IMPLANT    ? SHOULDER SURGERY     ? TONSILLECTOMY    ? TUBAL LIGATION    ? ? ?There were no vitals filed for this visit. ? ? Subjective Assessment - 01/04/22 1308   ? ? Subjective Patient reports doing well. She denies any pain; She denies any new falls. Patient reports working on HEP at home with her husband.   ? Patient is accompained by: Family member   spouse  ? Pertinent History Pt is a 76 y/o female who presents to PT evaluation with her spouse. Pt ambulating with U-walker, and her spouse is providing CGA with gait belt donned. Per spouse, pt diagnosed with progressive supranuclear palsy in 2019. Pt was going to attend outpatient PT earlier last year, but fell in August 2022 and fractured L hip (s/p IMN 06/11/2021). Pt spouse reports fx is fully healed and she has no current restrictions. Pt spent 32 days in rehab following fx and then received HH PT.  Pt current symptoms include unsteadiness, impaired gait, decreased strength and L hip pain. Pt spouse reports pt has had 1 fall in last 6 months. Pt tends to lose her balance posteriorly. Pt requires assist for majority of ADLs: dressing, bathing, meal prep. Her spouse does state, "I let her do as much as she can possibly  do." Pt can still eat independently and denies difficulties with swallowing. Pt reports her vision is affected. She is constantly dizzy (per spouse, this was one of pt's first symptoms of PSP). In addition to U-walker, pt has lift chair and recliner at home. Pt has difficulty getting up her ramp to enter her home so her spouse assists her. Per chart other PMH includes: sleep apnea, HTN, COPD, CAD, fibromyalgia, early dementia, displaced L intertrochanteric femur fracture.   ? Limitations Standing;Walking;House hold activities;Lifting;Writing;Reading   ? How long can you sit comfortably? not limited   ? How long can you stand comfortably? 15 minutes, pt reports "I feel unsteady." Pt must always hold on to something   ? How long can you walk comfortably? Pt fatigues quickly.   ?  Diagnostic tests refer to imaging from Aug 2022 per chart   ? Patient Stated Goals Pt would like to improve walking, balance, gait   ? Currently in Pain? No/denies   ? Pain Onset More than a month ago   ? ?  ?  ? ?  ? ? ? ? ? ? ? ? ? ?TREATMENT: ?PT instructed patient in LE strengthening ? ?Sit<>Stand from chair x5 reps with cues for reaching forward to increase ease with transfer, patient instructed in holding small ball to reduce push on arm rest. She reports increased challenge not holding onto chair;  ?  ?Standing at steps: ?-hip flexion march against green tband x10 reps each LE, patient exhibits decreased foot clearance with LLE with increased difficulty; Required heavy UE rail assist and cues for large amplitude movement; ?Patient fatigues quickly;  ? ?Standing with red tband around BLE: ?-hip abduction x10 reps each LE, requires min VCS to avoid leaning heavily on UE and increase erect posture;  ? ? ?Seated: ?Hip abduction/ER red tband x10 reps each LE with visual cues to increase ROM for better strengthening ?Hamstring curl red tband x10 reps each LE ?LAQ red tband x10 reps each LE ?Hip flexion march red tband x10 reps each LE ?Patient required min-moderate verbal/tactile cues for correct exercise technique including cues for erect posture and to increase ROM for better strengthening; ? ?Provided written HEP for adherence;  ?  ?NMR:  ?Standing at support bar: ?Instructed patient in forward/backward step over 1/2 bolster x5 reps with 2 HHA and mod VCs to increase step length for better foot clearance; ?-side stepping over 1/2 bolster x5 reps each direction with 2 HHA on rail and mod VCS to increase step length for better foot clearance and to improve obstacle negotiation; ?  ?She tolerated session well. She does report increased fatigue at end of session. Patient does exhibit decreased AROM on LLE compared to RLE. She also exhibits bradykinesia and hypokinesia which limits mobility;  ? ?Reviewed HEP with  caregiver for better adherence;  ?  ?  ?  ?  ? ? ? ? ? ? ? ? ? ? ? ? ? ? ? ? ? ? ? PT Education - 01/04/22 1255   ? ? Education Details exercise form/technique;   ? Person(s) Educated Patient   ? Methods Explanation;Verbal cues   ? Comprehension Verbalized understanding;Returned demonstration;Verbal cues required;Need further instruction   ? ?  ?  ? ?  ? ? ? PT Short Term Goals - 12/21/21 1551   ? ?  ? PT SHORT TERM GOAL #1  ? Title Pt will be independent with HEP in order to improve strength and balance in order to decrease fall  risk and improve function at home and work.   ? Baseline 3/1: issued. focus on large amplitude STS (with spouse providing CGA)   ? Time 6   ? Period Weeks   ? Status New   ? Target Date 02/01/22   ?  ? PT SHORT TERM GOAL #2  ? Title --   ? Baseline --   ? Time --   ? Period --   ? Status --   ? Target Date --   ?  ? PT SHORT TERM GOAL #3  ? Title --   ? Baseline --   ? Time --   ? Period --   ? Status --   ? Target Date --   ?  ? PT SHORT TERM GOAL #4  ? Title --   ? Time --   ? Period --   ? Status --   ? Target Date --   ?  ? PT SHORT TERM GOAL #5  ? Title --   ? Baseline --   ? Time --   ? Period --   ? Status --   ? Target Date --   ? ?  ?  ? ?  ? ? ? ? PT Long Term Goals - 12/21/21 1553   ? ?  ? PT LONG TERM GOAL #1  ? Title Pt will decrease 5TSTS by at least 3 seconds in order to demonstrate clinically significant improvement in LE strength.   ? Baseline 3/1: 29 sec with use of UEs to assist   ? Time 12   ? Period Weeks   ? Status New   ? Target Date 03/15/22   ?  ? PT LONG TERM GOAL #2  ? Title Pt will decrease TUG to below 14 seconds/decrease in order to demonstrate decreased fall risk.   ? Baseline 3/1: 49 sec   ? Time 12   ? Period Weeks   ? Status New   ? Target Date 03/15/22   ?  ? PT LONG TERM GOAL #3  ? Title Patient will increase BLE gross strength to 4+/5 as to improve functional strength for independent gait, increased standing tolerance and increased ADL ability.   ?  Baseline 3/1: 4/5 in BLEs   ? Time 12   ? Period Weeks   ? Status New   ? Target Date 03/15/22   ?  ? PT LONG TERM GOAL #4  ? Title Patient will increase FOTO score to equal to or greater than  39   to demo

## 2022-01-04 NOTE — Patient Instructions (Signed)
Access Code: [email or web address] ?URL: [email or web address] ?Date: 01/04/2022 ?Prepared by: Zettie Pho ? ?Exercises ?Seated March with Resistance - 1 x daily - 7 x weekly - 2 sets - 10 reps ?Seated Hip Abduction with Resistance - 1 x daily - 7 x weekly - 2 sets - 10 reps ?Seated Knee Extension with Resistance - 1 x daily - 7 x weekly - 2 sets - 10 reps ?Standing Hip Abduction with Resistance at Ankles and Counter Support - 1 x daily - 7 x weekly - 1 sets - 10 reps ? ?

## 2022-01-06 ENCOUNTER — Encounter: Payer: Self-pay | Admitting: Physical Therapy

## 2022-01-06 ENCOUNTER — Ambulatory Visit: Payer: Medicare HMO | Admitting: Physical Therapy

## 2022-01-06 ENCOUNTER — Other Ambulatory Visit: Payer: Self-pay

## 2022-01-06 DIAGNOSIS — M6281 Muscle weakness (generalized): Secondary | ICD-10-CM

## 2022-01-06 DIAGNOSIS — R2681 Unsteadiness on feet: Secondary | ICD-10-CM

## 2022-01-06 DIAGNOSIS — R262 Difficulty in walking, not elsewhere classified: Secondary | ICD-10-CM

## 2022-01-06 DIAGNOSIS — R2689 Other abnormalities of gait and mobility: Secondary | ICD-10-CM

## 2022-01-06 NOTE — Therapy (Signed)
[place] ?[facility] ?[place], [place], [address] ?Phone: [phone]   Fax:  [phone] ? ?Physical Therapy Treatment ? ?Patient Details  ?Name: Tina Bowman ?MRN: [id number] ?Date of Birth: 06-Aug-1946 ?Referring Provider (PT): Luella Cook, MD ? ? ?Encounter Date: 01/06/2022 ? ? PT End of Session - 01/06/22 1013   ? ? Visit Number 5   ? Number of Visits 25   ? Date for PT Re-Evaluation 03/15/22   ? Authorization Time Period 12/21/2021-03/15/2022   ? PT Start Time 1008   ? PT Stop Time 1052   ? PT Time Calculation (min) 44 min   ? Equipment Utilized During Treatment Gait belt   ? Activity Tolerance Patient tolerated treatment well   ? Behavior During Therapy [facility] for tasks assessed/performed   ? ?  ?  ? ?  ? ? ?Past Medical History:  ?Diagnosis Date  ? Arthritis   ? Asthma   ? COPD (chronic obstructive pulmonary disease) ([place])   ? Fibromyalgia   ? Heart disease   ? Hypertension   ? Parkinsonism ([place])   ? Progressive supranuclear palsy ([place]) 05/23/2018  ? Pt reported this dx has been recinded by the MD at the [facility] disorders clinic, and replaced with Atypical Parkinsonism..  ? Sleep apnea   ? Slurred speech   ? foggy minded  ? ? ?Past Surgical History:  ?Procedure Laterality Date  ? ABDOMINAL HYSTERECTOMY    ? APPENDECTOMY    ? BACK SURGERY    ? BREAST EXCISIONAL BIOPSY Left   ? 2000's  ? BREAST SURGERY    ? CATARACT EXTRACTION    ? Also, retina surgery  ? CHOLECYSTECTOMY    ? COLONOSCOPY N/A 05/11/2021  ? Procedure: COLONOSCOPY;  Surgeon: Toledo, Benay Pike, MD;  Location: [facility] ENDOSCOPY;  Service: Gastroenterology;  Laterality: N/A;  PARKINSON'S  ? EYE SURGERY    ? FRACTURE SURGERY    ? INTRAMEDULLARY (IM) NAIL INTERTROCHANTERIC Left 06/11/2021  ? Procedure: INTRAMEDULLARY (IM) NAIL INTERTROCHANTRIC;  Surgeon: Corky Mull, MD;  Location: [facility] ORS;  Service: Orthopedics;  Laterality: Left;  ? NARCOTIC PAIN PUMP IMPLANT    ? SHOULDER SURGERY     ? TONSILLECTOMY    ? TUBAL LIGATION    ? ? ?There were no vitals filed for this visit. ? ? Subjective Assessment - 01/06/22 1012   ? ? Subjective Pt reports no significant changes since last session.   ? Patient is accompained by: --   spouse  ? Pertinent History Pt is a 76 y/o female who presents to PT evaluation with her spouse. Pt ambulating with U-walker, and her spouse is providing CGA with gait belt donned. Per spouse, pt diagnosed with progressive supranuclear palsy in 2019. Pt was going to attend outpatient PT earlier last year, but fell in August 2022 and fractured L hip (s/p IMN 06/11/2021). Pt spouse reports fx is fully healed and she has no current restrictions. Pt spent 32 days in rehab following fx and then received HH PT.  Pt current symptoms include unsteadiness, impaired gait, decreased strength and L hip pain. Pt spouse reports pt has had 1 fall in last 6 months. Pt tends to lose her balance posteriorly. Pt requires assist for majority of ADLs: dressing, bathing, meal prep. Her spouse does state, "I let her do as much as she can possibly do." Pt can still eat independently and denies difficulties with swallowing. Pt reports her vision is  affected. She is constantly dizzy (per spouse, this was one of pt's first symptoms of PSP). In addition to U-walker, pt has lift chair and recliner at home. Pt has difficulty getting up her ramp to enter her home so her spouse assists her. Per chart other PMH includes: sleep apnea, HTN, COPD, CAD, fibromyalgia, early dementia, displaced L intertrochanteric femur fracture.   ? Limitations Standing;Walking;House hold activities;Lifting;Writing;Reading   ? How long can you sit comfortably? not limited   ? How long can you stand comfortably? 15 minutes, pt reports "I feel unsteady." Pt must always hold on to something   ? How long can you walk comfortably? Pt fatigues quickly.   ? Diagnostic tests refer to imaging from Aug 2022 per chart   ? Patient Stated Goals Pt  would like to improve walking, balance, gait   ? Pain Onset More than a month ago   ? ?  ?  ? ?  ? ? ? ? ? ? ?Exercise/Activity Sets/Reps/Time/ Resistance Assistance Charge type Comments  ?Nustep  Level 2 x 6 min  Min for gettting on and off and for UE and LE positioning  Therex  Good cadence throughout with good coordinated UE and LE movements   ?LAQ 2 x 10 x 2# AW on L , 3# on R    TherEX Increased difficulty on the Left compared to the right  ?Cues for full arc of motion with TB located anterior  ?Hip ABDuction standing  2 x 10 RTB   TherEX Tactile cues for proper performance  ?Hip Adduction squeeze seated  2 x 10 with ball between legs   There ex Physical therapist physical assist with keeping ball in place between repetitions  ?Standing marching with TB as target (located anteriorly and parallel to floor)  X 10 ea side  B UE  Therex  Cues for large movements   ?Standing on airex pad - passing ball *6ea  No upper extremity Neuromuscular reeducation Cues for utilization of both upper extremities to implement proper trunk rotation no loss of balance noted  ?Standing step over 1/2 foam  lateral step overs x 6 on ea side   B UE  There ex Consistent cues to prevent shuffling gait to get closer to foam pad.  Patient tends to hit pad on step over portion without proper foot clearance on opposite side of pad.  Consistent verbal cues for proper performance of this exercise  ?Standing on Airex pad at self-selected stance *45sec No upper extremity Neuromuscular reeducation Cues to put hands by side to prevent consistent upper extremity utilization.  No loss of balance noted with proper hand positioning by her sides  ?STS with cues for forward reach to ipmrove wwight shift 2*5 CGA, No UE assist There act  Cues for forward lean with hands to improve weight shifting and cues to push through feet.   ?      ?      ?Treatment Provided this session  ? ?Pt educated throughout session about proper posture and technique with  exercises. Improved exercise technique, movement at target joints, use of target muscles after min to mod verbal, visual, tactile cues. ?Note: Portions of this document were prepared using Dragon voice recognition software and although reviewed may contain unintentional dictation errors in syntax, grammar, or spelling. ? ? ? ? ? ? ? ? ? ? ? ? ? ? ? ? ? ? ? ? ? ? ? ? PT Education - 01/06/22 1012   ? ?  Education Details exercise form and technique   ? Person(s) Educated Patient   ? Methods Explanation;Demonstration;Tactile cues;Verbal cues   ? Comprehension Verbalized understanding;Returned demonstration;Verbal cues required;Need further instruction   ? ?  ?  ? ?  ? ? ? PT Short Term Goals - 12/21/21 1551   ? ?  ? PT SHORT TERM GOAL #1  ? Title Pt will be independent with HEP in order to improve strength and balance in order to decrease fall risk and improve function at home and work.   ? Baseline 3/1: issued. focus on large amplitude STS (with spouse providing CGA)   ? Time 6   ? Period Weeks   ? Status New   ? Target Date 02/01/22   ?  ? PT SHORT TERM GOAL #2  ? Title --   ? Baseline --   ? Time --   ? Period --   ? Status --   ? Target Date --   ?  ? PT SHORT TERM GOAL #3  ? Title --   ? Baseline --   ? Time --   ? Period --   ? Status --   ? Target Date --   ?  ? PT SHORT TERM GOAL #4  ? Title --   ? Time --   ? Period --   ? Status --   ? Target Date --   ?  ? PT SHORT TERM GOAL #5  ? Title --   ? Baseline --   ? Time --   ? Period --   ? Status --   ? Target Date --   ? ?  ?  ? ?  ? ? ? ? PT Long Term Goals - 12/21/21 1553   ? ?  ? PT LONG TERM GOAL #1  ? Title Pt will decrease 5TSTS by at least 3 seconds in order to demonstrate clinically significant improvement in LE strength.   ? Baseline 3/1: 29 sec with use of UEs to assist   ? Time 12   ? Period Weeks   ? Status New   ? Target Date 03/15/22   ?  ? PT LONG TERM GOAL #2  ? Title Pt will decrease TUG to below 14 seconds/decrease in order to demonstrate  decreased fall risk.   ? Baseline 3/1: 49 sec   ? Time 12   ? Period Weeks   ? Status New   ? Target Date 03/15/22   ?  ? PT LONG TERM GOAL #3  ? Title Patient will increase BLE gross strength to 4+/5 as to improve

## 2022-01-11 ENCOUNTER — Ambulatory Visit: Payer: Medicare HMO | Admitting: Physical Therapy

## 2022-01-11 ENCOUNTER — Other Ambulatory Visit: Payer: Self-pay

## 2022-01-11 ENCOUNTER — Encounter: Payer: Self-pay | Admitting: Physical Therapy

## 2022-01-11 DIAGNOSIS — R2689 Other abnormalities of gait and mobility: Secondary | ICD-10-CM

## 2022-01-11 DIAGNOSIS — M25552 Pain in left hip: Secondary | ICD-10-CM

## 2022-01-11 DIAGNOSIS — M6281 Muscle weakness (generalized): Secondary | ICD-10-CM

## 2022-01-11 DIAGNOSIS — R262 Difficulty in walking, not elsewhere classified: Secondary | ICD-10-CM

## 2022-01-11 DIAGNOSIS — R2681 Unsteadiness on feet: Secondary | ICD-10-CM | POA: Diagnosis not present

## 2022-01-11 DIAGNOSIS — R278 Other lack of coordination: Secondary | ICD-10-CM

## 2022-01-11 NOTE — Therapy (Signed)
[place] ?[facility] ?[address] ?[place], [place], [address] ?Phone: [phone]   Fax:  [phone] ? ?Physical Therapy Treatment ? ?Patient Details  ?Name: Tina Bowman ?MRN: [id number] ?Date of Birth: [birth year] ?Referring Provider (PT): Peyton Bottoms, MD ? ? ?Encounter Date: 01/11/2022 ? ? PT End of Session - 01/11/22 1320   ? ? Visit Number 6   ? Number of Visits 25   ? Date for PT Re-Evaluation 03/15/22   ? Authorization Time Period 12/21/2021-03/15/2022   ? PT Start Time 1302   ? PT Stop Time 1345   ? PT Time Calculation (min) 43 min   ? Equipment Utilized During Treatment Gait belt   ? Activity Tolerance Patient tolerated treatment well   ? Behavior During Therapy [facility] for tasks assessed/performed   ? ?  ?  ? ?  ? ? ?Past Medical History:  ?Diagnosis Date  ? Arthritis   ? Asthma   ? COPD (chronic obstructive pulmonary disease) (HCC)   ? Fibromyalgia   ? Heart disease   ? Hypertension   ? Parkinsonism (HCC)   ? 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 Pt reported this dx has been recinded by the MD at the [facility] disorders clinic, and replaced with Atypical Parkinsonism..  ? Sleep apnea   ? Slurred speech   ? foggy minded  ? ? ?Past Surgical History:  ?Procedure Laterality Date  ? ABDOMINAL HYSTERECTOMY    ? APPENDECTOMY    ? BACK SURGERY    ? BREAST EXCISIONAL BIOPSY Left   ? 2000's  ? BREAST SURGERY    ? CATARACT EXTRACTION    ? Also, retina surgery  ? CHOLECYSTECTOMY    ? COLONOSCOPY N/A 05/11/2021  ? Procedure: COLONOSCOPY;  Surgeon: Toledo, Boykin Nearing, MD;  Location: [facility] ENDOSCOPY;  Service: Gastroenterology;  Laterality: N/A;  PARKINSON'S  ? EYE SURGERY    ? FRACTURE SURGERY    ? INTRAMEDULLARY (IM) NAIL INTERTROCHANTERIC Left 06/11/2021  ? Procedure: INTRAMEDULLARY (IM) NAIL INTERTROCHANTRIC;  Surgeon: Christena Flake, MD;  Location: [facility] ORS;  Service: Orthopedics;  Laterality: Left;  ? NARCOTIC PAIN PUMP IMPLANT    ? SHOULDER SURGERY     ? TONSILLECTOMY    ? TUBAL LIGATION    ? ? ?There were no vitals filed for this visit. ? ? Subjective Assessment - 01/11/22 1319   ? ? Subjective Patient reports doing well; no changes since last session; caregiver reports they have been working on HEP well;   ? Patient is accompained by: --   spouse  ? Pertinent History Pt is a 76 y/o female who presents to PT evaluation with her spouse. Pt ambulating with U-walker, and her spouse is providing CGA with gait belt donned. Per spouse, pt diagnosed with progressive supranuclear palsy in 2019. Pt was going to attend outpatient PT earlier last year, but fell in August 2022 and fractured L hip (s/p IMN 06/11/2021). Pt spouse reports fx is fully healed and she has no current restrictions. Pt spent 32 days in rehab following fx and then received HH PT.  Pt current symptoms include unsteadiness, impaired gait, decreased strength and L hip pain. Pt spouse reports pt has had 1 fall in last 6 months. Pt tends to lose her balance posteriorly. Pt requires assist for majority of ADLs: dressing, bathing, meal prep. Her spouse does state, "I let her do as much as she can possibly do." Pt can still eat independently  and denies difficulties with swallowing. Pt reports her vision is affected. She is constantly dizzy (per spouse, this was one of pt's first symptoms of PSP). In addition to U-walker, pt has lift chair and recliner at home. Pt has difficulty getting up her ramp to enter her home so her spouse assists her. Per chart other PMH includes: sleep apnea, HTN, COPD, CAD, fibromyalgia, early dementia, displaced L intertrochanteric femur fracture.   ? Limitations Standing;Walking;House hold activities;Lifting;Writing;Reading   ? How long can you sit comfortably? not limited   ? How long can you stand comfortably? 15 minutes, pt reports "I feel unsteady." Pt must always hold on to something   ? How long can you walk comfortably? Pt fatigues quickly.   ? Diagnostic tests refer to  imaging from Aug 2022 per chart   ? Patient Stated Goals Pt would like to improve walking, balance, gait   ? Currently in Pain? No/denies   ? Pain Onset More than a month ago   ? ?  ?  ? ?  ? ? ? ? ? ?  ?TREATMENT: ?PT instructed patient in LE strengthening ? ?Leg press: ?BLE 40# 2x10 ?Required min A for LE positioning and min VCs for correct exercise technique; Initially she does report increased soreness in LLE with positioning but that was alleviated with decreased hip flexion;  ? ?Seated: ?Hamstring curl green tband x10 reps each LE ? ?  ?NMR:  ?Standing in parallel bars: ?Ladder drills: ?Forward walking, reciprocal x4 laps with BUE rail assist and cues to increase step length for better foot clearance; Patient requires CGA and often mis-steps, taking 2 feet per square and/or catching toe on rung; ? ?Side stepping on ladder x1 lap each direction with heavy UE weight bearing and short shuffled steps, often catching LE on rung/ladder parts. She required mod VCs to increase step length and increase foot clearance;  ? ?Standing on airex pad: ?-feet apart: heel/toe raises x15 reps with BUE rail assist and cues for erect posture ?-forward/backward step airex to airex x5 reps, with cues to increase step length, patient requires min A and exhibits heavy UE weight bearing with decreased step length when stepping backward;  ?-feet apart, unsupported standing x20 sec, no difficulty good stance control;  ?-Standing one foot on each airex (staggered stance) ? Unsupported standing 10 sec hold ? Progressed to forward/backward weight shift, required max VCs and tactile cues x1 min each foot in front, reports a lot of difficulty; ? ?Forward/backward walking in parallel bars x2 laps with BUE rail assist, max VCs for increased step length especially with backward walking; patient also required cues to slide arms backward as she often forgets to move UE and leans heavily forward on UE; ? ?She tolerated session fair. She does report  increased fatigue at end of session. Patient does exhibit decreased AROM on LLE compared to RLE. She also exhibits bradykinesia and hypokinesia which limits mobility;  ?  ? ?  ?  ? ? ? ? ? ? ? ? ? ? ? ? ? ? ? ? ? ? ? ? ? ? ? PT Education - 01/11/22 1319   ? ? Education Details exercise technique/positioning;   ? Person(s) Educated Patient   ? Methods Explanation;Verbal cues   ? Comprehension Verbalized understanding;Returned demonstration;Verbal cues required;Need further instruction   ? ?  ?  ? ?  ? ? ? PT Short Term Goals - 12/21/21 1551   ? ?  ? PT SHORT TERM GOAL #1  ?  Title Pt will be independent with HEP in order to improve strength and balance in order to decrease fall risk and improve function at home and work.   ? Baseline 3/1: issued. focus on large amplitude STS (with spouse providing CGA)   ? Time 6   ? Period Weeks   ? Status New   ? Target Date 02/01/22   ?  ? PT SHORT TERM GOAL #2  ? Title --   ? Baseline --   ? Time --   ? Period --   ? Status --   ? Target Date --   ?  ? PT SHORT TERM GOAL #3  ? Title --   ? Baseline --   ? Time --   ? Period --   ? Status --   ? Target Date --   ?  ? PT SHORT TERM GOAL #4  ? Title --   ? Time --   ? Period --   ? Status --   ? Target Date --   ?  ? PT SHORT TERM GOAL #5  ? Title --   ? Baseline --   ? Time --   ? Period --   ? Status --   ? Target Date --   ? ?  ?  ? ?  ? ? ? ? PT Long Term Goals - 12/21/21 1553   ? ?  ? PT LONG TERM GOAL #1  ? Title Pt will decrease 5TSTS by at least 3 seconds in order to demonstrate clinically significant improvement in LE strength.   ? Baseline 3/1: 29 sec with use of UEs to assist   ? Time 12   ? Period Weeks   ? Status New   ? Target Date 03/15/22   ?  ? PT LONG TERM GOAL #2  ? Title Pt will decrease TUG to below 14 seconds/decrease in order to demonstrate decreased fall risk.   ? Baseline 3/1: 49 sec   ? Time 12   ? Period Weeks   ? Status New   ? Target Date 03/15/22   ?  ? PT LONG TERM GOAL #3  ? Title Patient will increase  BLE gross strength to 4+/5 as to improve functional strength for independent gait, increased standing tolerance and increased ADL ability.   ? Baseline 3/1: 4/5 in BLEs   ? Time 12   ? Period Weeks

## 2022-01-13 ENCOUNTER — Ambulatory Visit: Payer: Medicare HMO

## 2022-01-13 ENCOUNTER — Other Ambulatory Visit: Payer: Self-pay

## 2022-01-13 DIAGNOSIS — R2681 Unsteadiness on feet: Secondary | ICD-10-CM | POA: Diagnosis not present

## 2022-01-13 DIAGNOSIS — R278 Other lack of coordination: Secondary | ICD-10-CM

## 2022-01-13 DIAGNOSIS — M25552 Pain in left hip: Secondary | ICD-10-CM

## 2022-01-13 DIAGNOSIS — R2689 Other abnormalities of gait and mobility: Secondary | ICD-10-CM

## 2022-01-13 DIAGNOSIS — M6281 Muscle weakness (generalized): Secondary | ICD-10-CM

## 2022-01-13 NOTE — Therapy (Signed)
[place] ?[facility] ?[address] ?[place], [place], [address] ?Phone: [phone]   Fax:  [phone] ? ?Physical Therapy Treatment ? ?Patient Details  ?Name: Tina ChickDiane N Bowman ?MRN: [id number] ?Date of Birth: 06/10/1946 ?Referring Provider (PT): Peyton BottomsMitchell, Kyle Todd, MD ? ? ?Encounter Date: 01/13/2022 ? ? PT End of Session - 01/13/22 1119   ? ? Visit Number 7   ? Number of Visits 25   ? Date for PT Re-Evaluation 03/15/22   ? Authorization Time Period 12/21/2021-03/15/2022   ? PT Start Time 1020   ? PT Stop Time 1102   ? PT Time Calculation (min) 42 min   ? Equipment Utilized During Treatment Gait belt   ? Activity Tolerance Patient tolerated treatment well;No increased pain   ? [facility] assessed/performed   ? ?  ?  ? ?  ? ? ?Past Medical History:  ?Diagnosis Date  ? Arthritis   ? Asthma   ? COPD (chronic obstructive pulmonary disease) (HCC)   ? Fibromyalgia   ? Heart disease   ? Hypertension   ? Parkinsonism (HCC)   ? 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 Pt reported this dx has been recinded by the [facility], and replaced with Atypical Parkinsonism..  ? Sleep apnea   ? Slurred speech   ? foggy minded  ? ? ?Past Surgical History:  ?Procedure Laterality Date  ? ABDOMINAL HYSTERECTOMY    ? APPENDECTOMY    ? BACK SURGERY    ? BREAST EXCISIONAL BIOPSY Left   ? 2000's  ? BREAST SURGERY    ? CATARACT EXTRACTION    ? Also, retina surgery  ? CHOLECYSTECTOMY    ? COLONOSCOPY N/A 05/11/2021  ? Procedure: COLONOSCOPY;  Surgeon: Toledo, Boykin Nearingeodoro K, MD;  Location: [facility];  Service: Gastroenterology;  Laterality: N/A;  PARKINSON'S  ? EYE SURGERY    ? FRACTURE SURGERY    ? INTRAMEDULLARY (IM) NAIL INTERTROCHANTERIC Left 06/11/2021  ? Procedure: INTRAMEDULLARY (IM) NAIL INTERTROCHANTRIC;  Surgeon: Christena FlakePoggi, John J, MD;  Location: [facility] ORS;  Service: Orthopedics;  Laterality: Left;  ? NARCOTIC PAIN PUMP IMPLANT    ?  SHOULDER SURGERY    ? TONSILLECTOMY    ? TUBAL LIGATION    ? ? ?There were no vitals filed for this visit. ? ? Subjective Assessment - 01/13/22 1115   ? ? Subjective Spouse present for session. Pt reports continued LE pain. No other changes reported.   ? Patient is accompained by: --   spouse  ? Pertinent History Pt is a 76 y/o female who presents to PT evaluation with her spouse. Pt ambulating with U-walker, and her spouse is providing CGA with gait belt donned. Per spouse, pt diagnosed with progressive supranuclear palsy in 2019. Pt was going to attend outpatient PT earlier last year, but fell in August 2022 and fractured L hip (s/p IMN 06/11/2021). Pt spouse reports fx is fully healed and she has no current restrictions. Pt spent 32 days in rehab following fx and then received HH PT.  Pt current symptoms include unsteadiness, impaired gait, decreased strength and L hip pain. Pt spouse reports pt has had 1 fall in last 6 months. Pt tends to lose her balance posteriorly. Pt requires assist for majority of ADLs: dressing, bathing, meal prep. Her spouse does state, "I let her do as much as she can possibly do." Pt can still eat independently and denies difficulties  with swallowing. Pt reports her vision is affected. She is constantly dizzy (per spouse, this was one of pt's first symptoms of PSP). In addition to U-walker, pt has lift chair and recliner at home. Pt has difficulty getting up her ramp to enter her home so her spouse assists her. Per chart other PMH includes: sleep apnea, HTN, COPD, CAD, fibromyalgia, early dementia, displaced L intertrochanteric femur fracture.   ? Limitations Standing;Walking;House hold activities;Lifting;Writing;Reading   ? How long can you sit comfortably? not limited   ? How long can you stand comfortably? 15 minutes, pt reports "I feel unsteady." Pt must always hold on to something   ? How long can you walk comfortably? Pt fatigues quickly.   ? Diagnostic tests refer to imaging from  Aug 2022 per chart   ? Patient Stated Goals Pt would like to improve walking, balance, gait   ? Currently in Pain? Yes   ? Pain Location Leg   ? Pain Orientation Left   ? Pain Onset More than a month ago   ? ?  ?  ? ?  ? ?INTERVENTIONS ? ?PT instructed patient in LE strengthening ? ?Seated: ?Hamstring curl blue tband 2x15 reps each LE; rates medium ? ?STS with large amplitude movement and reach 10x, 6x. Pt rates hard. Pt improves stability with reps as well as technique. PT provides frequent demo/VC ?  ?  ?NMR:  ?Standing in parallel bars: ?Ladder drills: ?Forward walking for foot clearance and to promote increased step length and dynamic balance 4x length of bars. Pt exhibits good foot clearance. ?Backward stepping - 4x. Very challenging for pt. Requires min a. Difficulty with step length and fully clearing foot. Rated difficult. ?  ?Gait training: pt using RW ?Ambulating over 10 meter track: ?Step length exercise -  ?Trial 1 -51 steps ?Trial 2 -36 steps (significant improvement with cuing for large amplitude movement). ?Several minutes of the following: ?Ambulating with cuing for upright posture and large amplitude steps, focus on LLE - multiple cues used "big step," "big stomp" toward external object, "big kick." Pt intermittently able to correct LLE. Pt more consistent with increasing step-length RLE.  ?Ambulating with PT providing forward push on RW - pt able to consistently increase step-length with this method BLEs.  ? ?Pt educated throughout session about proper posture and technique with exercises. Improved exercise technique, movement at target joints, use of target muscles after min to mod verbal, visual, tactile cues. ? ?Pt educated throughout session about proper posture and technique with exercises. Improved exercise technique, movement at target joints, use of target muscles after min to mod verbal, visual, tactile cues. ? ? ? ? PT Education - 01/13/22 1118   ? ? Education Details exercise technique,  body mechanics   ? Person(s) Educated Patient;Spouse   ? Methods Explanation;Demonstration;Tactile cues;Verbal cues   ? Comprehension Verbalized understanding;Returned demonstration;Tactile cues required;Verbal cues required;Need further instruction   ? ?  ?  ? ?  ? ? ? PT Short Term Goals - 12/21/21 1551   ? ?  ? PT SHORT TERM GOAL #1  ? Title Pt will be independent with HEP in order to improve strength and balance in order to decrease fall risk and improve function at home and work.   ? Baseline 3/1: issued. focus on large amplitude STS (with spouse providing CGA)   ? Time 6   ? Period Weeks   ? Status New   ? Target Date 02/01/22   ?  ? PT  SHORT TERM GOAL #2  ? Title --   ? Baseline --   ? Time --   ? Period --   ? Status --   ? Target Date --   ?  ? PT SHORT TERM GOAL #3  ? Title --   ? Baseline --   ? Time --   ? Period --   ? Status --   ? Target Date --   ?  ? PT SHORT TERM GOAL #4  ? Title --   ? Time --   ? Period --   ? Status --   ? Target Date --   ?  ? PT SHORT TERM GOAL #5  ? Title --   ? Baseline --   ? Time --   ? Period --   ? Status --   ? Target Date --   ? ?  ?  ? ?  ? ? ? ? PT Long Term Goals - 12/21/21 1553   ? ?  ? PT LONG TERM GOAL #1  ? Title Pt will decrease 5TSTS by at least 3 seconds in order to demonstrate clinically significant improvement in LE strength.   ? Baseline 3/1: 29 sec with use of UEs to assist   ? Time 12   ? Period Weeks   ? Status New   ? Target Date 03/15/22   ?  ? PT LONG TERM GOAL #2  ? Title Pt will decrease TUG to below 14 seconds/decrease in order to demonstrate decreased fall risk.   ? Baseline 3/1: 49 sec   ? Time 12   ? Period Weeks   ? Status New   ? Target Date 03/15/22   ?  ? PT LONG TERM GOAL #3  ? Title Patient will increase BLE gross strength to 4+/5 as to improve functional strength for independent gait, increased standing tolerance and increased ADL ability.   ? Baseline 3/1: 4/5 in BLEs   ? Time 12   ? Period Weeks   ? Status New   ? Target Date 03/15/22    ?  ? PT LONG TERM GOAL #4  ? Title Patient will increase FOTO score to equal to or greater than  39   to demonstrate statistically significant improvement in mobility and quality of life.   ? [employer]

## 2022-01-17 ENCOUNTER — Ambulatory Visit: Payer: Medicare HMO

## 2022-01-17 ENCOUNTER — Other Ambulatory Visit: Payer: Self-pay

## 2022-01-17 DIAGNOSIS — R2681 Unsteadiness on feet: Secondary | ICD-10-CM | POA: Diagnosis not present

## 2022-01-17 DIAGNOSIS — M25552 Pain in left hip: Secondary | ICD-10-CM

## 2022-01-17 DIAGNOSIS — R278 Other lack of coordination: Secondary | ICD-10-CM

## 2022-01-17 DIAGNOSIS — R2689 Other abnormalities of gait and mobility: Secondary | ICD-10-CM

## 2022-01-17 DIAGNOSIS — M6281 Muscle weakness (generalized): Secondary | ICD-10-CM

## 2022-01-17 NOTE — Therapy (Signed)
[place] ?[facility] ?[address] ?[place], [place], [address] ?Phone: [phone]   Fax:  [phone] ? ?Physical Therapy Treatment ? ?Patient Details  ?Name: Tina Bowman ?MRN: [id number] ?Date of Birth: 1946-04-18 ?Referring Provider (PT): Peyton Bottoms, MD ? ? ?Encounter Date: 01/17/2022 ? ? PT End of Session - 01/18/22 0904   ? ? Visit Number 8   ? Number of Visits 25   ? Date for PT Re-Evaluation 03/15/22   ? Authorization Time Period 12/21/2021-03/15/2022   ? PT Start Time 1101   ? PT Stop Time 1145   ? PT Time Calculation (min) 44 min   ? Equipment Utilized During Treatment Gait belt   ? Activity Tolerance Patient tolerated treatment well;Patient limited by pain   ? Behavior During Therapy [facility] for tasks assessed/performed   ? ?  ?  ? ?  ? ? ?Past Medical History:  ?Diagnosis Date  ? Arthritis   ? Asthma   ? COPD (chronic obstructive pulmonary disease) (HCC)   ? Fibromyalgia   ? Heart disease   ? Hypertension   ? Parkinsonism (HCC)   ? 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 Pt reported this dx has been recinded by the MD at the [facility] disorders clinic, and replaced with Atypical Parkinsonism..  ? Sleep apnea   ? Slurred speech   ? foggy minded  ? ? ?Past Surgical History:  ?Procedure Laterality Date  ? ABDOMINAL HYSTERECTOMY    ? APPENDECTOMY    ? BACK SURGERY    ? BREAST EXCISIONAL BIOPSY Left   ? 2000's  ? BREAST SURGERY    ? CATARACT EXTRACTION    ? Also, retina surgery  ? CHOLECYSTECTOMY    ? COLONOSCOPY N/A 05/11/2021  ? Procedure: COLONOSCOPY;  Surgeon: Toledo, Boykin Nearing, MD;  Location: [facility];  Service: Gastroenterology;  Laterality: N/A;  PARKINSON'S  ? EYE SURGERY    ? FRACTURE SURGERY    ? INTRAMEDULLARY (IM) NAIL INTERTROCHANTERIC Left 06/11/2021  ? Procedure: INTRAMEDULLARY (IM) NAIL INTERTROCHANTRIC;  Surgeon: Christena Flake, MD;  Location: [facility];  Service: Orthopedics;  Laterality: Left;  ? NARCOTIC PAIN PUMP  IMPLANT    ? SHOULDER SURGERY    ? TONSILLECTOMY    ? TUBAL LIGATION    ? ? ?There were no vitals filed for this visit. ? ? Subjective Assessment - 01/17/22 1109   ? ? Subjective Pt reports she has performed some of her HEP since last session. Pt with L hip pain. Spouse present for appointment.   ? Patient is accompained by: --   spouse  ? Pertinent History Pt is a 76 y/o female who presents to PT evaluation with her spouse. Pt ambulating with U-walker, and her spouse is providing CGA with gait belt donned. Per spouse, pt diagnosed with progressive supranuclear palsy in 2019. Pt was going to attend outpatient PT earlier last year, but fell in August 2022 and fractured L hip (s/p IMN 06/11/2021). Pt spouse reports fx is fully healed and she has no current restrictions. Pt spent 32 days in rehab following fx and then received HH PT.  Pt current symptoms include unsteadiness, impaired gait, decreased strength and L hip pain. Pt spouse reports pt has had 1 fall in last 6 months. Pt tends to lose her balance posteriorly. Pt requires assist for majority of ADLs: dressing, bathing, meal prep. Her spouse does state, "I let her do as much as she can possibly  do." Pt can still eat independently and denies difficulties with swallowing. Pt reports her vision is affected. She is constantly dizzy (per spouse, this was one of pt's first symptoms of PSP). In addition to U-walker, pt has lift chair and recliner at home. Pt has difficulty getting up her ramp to enter her home so her spouse assists her. Per chart other PMH includes: sleep apnea, HTN, COPD, CAD, fibromyalgia, early dementia, displaced L intertrochanteric femur fracture.   ? Limitations Standing;Walking;House hold activities;Lifting;Writing;Reading   ? How long can you sit comfortably? not limited   ? How long can you stand comfortably? 15 minutes, pt reports "I feel unsteady." Pt must always hold on to something   ? How long can you walk comfortably? Pt fatigues  quickly.   ? Diagnostic tests refer to imaging from Aug 2022 per chart   ? Patient Stated Goals Pt would like to improve walking, balance, gait   ? Currently in Pain? Yes   ? Pain Location Hip   ? Pain Orientation Left   ? Pain Onset More than a month ago   ? ?  ?  ? ?  ? ? ? ?INTERVENTIONS ? ?PT instructed patient in LE strengthening ?  ?Seated: ?Hamstring curl blue tband 20 reps each LE; rates medium. Cuing for increased speed of movement to promote muscular power. ?  ?STS with large amplitude movement and reach 8x 2 sets. Pt rates as medium. PT continues to provide frequent demo/VC for technique. Pt intermittently able to return correct demo. ? ?Seated LAQ with 4# weights donned each LE: 2x10 each LE  ? ?NMR: Gait belt donned and CGA provided throughout unless otherwise specified ?2# weights donned each LE- ?Ladder drills: ?Forward walking for foot clearance and to promote increased step length and dynamic balance 6x length of agility ladder. Pt intermittently corrects for increased step length. Pt rates intervention as hard. ? ?2# weights donned each LE ?Gait training: pt using RW, PT provides CGA performed x several minutes. TC/demo/VC for technique. Discontinued when pt reported increased L hip pain.  ? ?Pt educated throughout session about proper posture and technique with exercises. Improved exercise technique, movement at target joints, use of target muscles after min to mod verbal, visual, tactile cues. ? ? ? ? ? PT Education - 01/18/22 0904   ? ? Education Details exercise technique   ? Person(s) Educated Patient;Spouse   ? Methods Explanation;Demonstration;Tactile cues;Verbal cues   ? Comprehension Verbalized understanding;Returned demonstration;Verbal cues required;Tactile cues required;Need further instruction   ? ?  ?  ? ?  ? ? ? PT Short Term Goals - 12/21/21 1551   ? ?  ? PT SHORT TERM GOAL #1  ? Title Pt will be independent with HEP in order to improve strength and balance in order to decrease fall  risk and improve function at home and work.   ? Baseline 3/1: issued. focus on large amplitude STS (with spouse providing CGA)   ? Time 6   ? Period Weeks   ? Status New   ? Target Date 02/01/22   ?  ? PT SHORT TERM GOAL #2  ? Title --   ? Baseline --   ? Time --   ? Period --   ? Status --   ? Target Date --   ?  ? PT SHORT TERM GOAL #3  ? Title --   ? Baseline --   ? Time --   ? Period --   ?  Status --   ? Target Date --   ?  ? PT SHORT TERM GOAL #4  ? Title --   ? Time --   ? Period --   ? Status --   ? Target Date --   ?  ? PT SHORT TERM GOAL #5  ? Title --   ? Baseline --   ? Time --   ? Period --   ? Status --   ? Target Date --   ? ?  ?  ? ?  ? ? ? ? PT Long Term Goals - 12/21/21 1553   ? ?  ? PT LONG TERM GOAL #1  ? Title Pt will decrease 5TSTS by at least 3 seconds in order to demonstrate clinically significant improvement in LE strength.   ? Baseline 3/1: 29 sec with use of UEs to assist   ? Time 12   ? Period Weeks   ? Status New   ? Target Date 03/15/22   ?  ? PT LONG TERM GOAL #2  ? Title Pt will decrease TUG to below 14 seconds/decrease in order to demonstrate decreased fall risk.   ? Baseline 3/1: 49 sec   ? Time 12   ? Period Weeks   ? Status New   ? Target Date 03/15/22   ?  ? PT LONG TERM GOAL #3  ? Title Patient will increase BLE gross strength to 4+/5 as to improve functional strength for independent gait, increased standing tolerance and increased ADL ability.   ? Baseline 3/1: 4/5 in BLEs   ? Time 12   ? Period Weeks   ? Status New   ? Target Date 03/15/22   ?  ? PT LONG TERM GOAL #4  ? Title Patient will increase FOTO score to equal to or greater than  39   to demonstrate statistically significant improvement in mobility and quality of life.   ? Baseline 3/1: 21   ? Time 12   ? Period Weeks   ? Status New   ? Target Date 03/15/22   ?  ? PT LONG TERM GOAL #5  ? Title Patient will increase 10 meter walk test to >0.8 m/s as to improve gait speed for better community ambulation and to reduce fall  risk.   ? Baseline 3/1: 0.32 with u-step walker   ? Time 12   ? Period Weeks   ? Status New   ? Target Date 03/15/22   ? ?  ?  ? ?  ? ? ? ? ? ? ? ? Plan - 01/18/22 0914   ? ? Clinical Impression Statement Continu

## 2022-01-20 ENCOUNTER — Ambulatory Visit: Payer: Medicare HMO

## 2022-01-20 DIAGNOSIS — M6281 Muscle weakness (generalized): Secondary | ICD-10-CM

## 2022-01-20 DIAGNOSIS — R2689 Other abnormalities of gait and mobility: Secondary | ICD-10-CM

## 2022-01-20 DIAGNOSIS — M25552 Pain in left hip: Secondary | ICD-10-CM

## 2022-01-20 DIAGNOSIS — R2681 Unsteadiness on feet: Secondary | ICD-10-CM

## 2022-01-20 DIAGNOSIS — R278 Other lack of coordination: Secondary | ICD-10-CM

## 2022-01-20 NOTE — Therapy (Signed)
[place] ?[facility] ?[address] ?[place], [place], [address] ?Phone: [phone]   Fax:  [phone] ? ?Physical Therapy Treatment ? ?Patient Details  ?Name: Tina Bowman ?MRN: [id number] ?Date of Birth: 1946/04/05 ?Referring Provider (PT): Peyton Bottoms, MD ? ? ?Encounter Date: 01/20/2022 ? ? PT End of Session - 01/20/22 1117   ? ? Visit Number 9   ? Number of Visits 25   ? Date for PT Re-Evaluation 03/15/22   ? Authorization Time Period 12/21/2021-03/15/2022   ? PT Start Time 1018   ? PT Stop Time 1058   ? PT Time Calculation (min) 40 min   ? Equipment Utilized During Treatment Gait belt   ? Activity Tolerance Patient tolerated treatment well;Patient limited by pain   ? Behavior During Therapy [facility] for tasks assessed/performed   ? ?  ?  ? ?  ? ? ?Past Medical History:  ?Diagnosis Date  ? Arthritis   ? Asthma   ? COPD (chronic obstructive pulmonary disease) (HCC)   ? Fibromyalgia   ? Heart disease   ? Hypertension   ? Parkinsonism (HCC)   ? 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 Pt reported this dx has been recinded by the MD at the [facility] disorders clinic, and replaced with Atypical Parkinsonism..  ? Sleep apnea   ? Slurred speech   ? foggy minded  ? ? ?Past Surgical History:  ?Procedure Laterality Date  ? ABDOMINAL HYSTERECTOMY    ? APPENDECTOMY    ? BACK SURGERY    ? BREAST EXCISIONAL BIOPSY Left   ? 2000's  ? BREAST SURGERY    ? CATARACT EXTRACTION    ? Also, retina surgery  ? CHOLECYSTECTOMY    ? COLONOSCOPY N/A 05/11/2021  ? Procedure: COLONOSCOPY;  Surgeon: Toledo, Boykin Nearing, MD;  Location: [facility];  Service: Gastroenterology;  Laterality: N/A;  PARKINSON'S  ? EYE SURGERY    ? FRACTURE SURGERY    ? INTRAMEDULLARY (IM) NAIL INTERTROCHANTERIC Left 06/11/2021  ? Procedure: INTRAMEDULLARY (IM) NAIL INTERTROCHANTRIC;  Surgeon: Christena Flake, MD;  Location: [facility];  Service: Orthopedics;  Laterality: Left;  ? NARCOTIC PAIN PUMP  IMPLANT    ? SHOULDER SURGERY    ? TONSILLECTOMY    ? TUBAL LIGATION    ? ? ?There were no vitals filed for this visit. ? ? Subjective Assessment - 01/20/22 1018   ? ? Subjective Pt reports she is doing "pretty good" today. She reports continued L hip pain.   ? Patient is accompained by: --   spouse  ? Pertinent History Pt is a 76 y/o female who presents to PT evaluation with her spouse. Pt ambulating with U-walker, and her spouse is providing CGA with gait belt donned. Per spouse, pt diagnosed with progressive supranuclear palsy in 2019. Pt was going to attend outpatient PT earlier last year, but fell in August 2022 and fractured L hip (s/p IMN 06/11/2021). Pt spouse reports fx is fully healed and she has no current restrictions. Pt spent 32 days in rehab following fx and then received HH PT.  Pt current symptoms include unsteadiness, impaired gait, decreased strength and L hip pain. Pt spouse reports pt has had 1 fall in last 6 months. Pt tends to lose her balance posteriorly. Pt requires assist for majority of ADLs: dressing, bathing, meal prep. Her spouse does state, "I let her do as much as she can possibly do." Pt can still eat independently and  denies difficulties with swallowing. Pt reports her vision is affected. She is constantly dizzy (per spouse, this was one of pt's first symptoms of PSP). In addition to U-walker, pt has lift chair and recliner at home. Pt has difficulty getting up her ramp to enter her home so her spouse assists her. Per chart other PMH includes: sleep apnea, HTN, COPD, CAD, fibromyalgia, early dementia, displaced L intertrochanteric femur fracture.   ? Limitations Standing;Walking;House hold activities;Lifting;Writing;Reading   ? How long can you sit comfortably? not limited   ? How long can you stand comfortably? 15 minutes, pt reports "I feel unsteady." Pt must always hold on to something   ? How long can you walk comfortably? Pt fatigues quickly.   ? Diagnostic tests refer to  imaging from Aug 2022 per chart   ? Patient Stated Goals Pt would like to improve walking, balance, gait   ? Currently in Pain? Yes   ? Pain Orientation Left   ? Pain Onset More than a month ago   ? ?  ?  ? ?  ? ? ?  ?INTERVENTIONS ?  ?NMR ?STS with focus on large amplitude movement and reach x several minutes. Cuing for large amplitude forward reach, "nose over toes" for stability with push into standing position. PT continues to provide frequent demo/VC.TC for technique. Pt intermittently able to return correct demo. ? ?PT instructs pt in seated forward reach to floor with upright seated posture in chair followed by UE abduction. Intervention performed to promote sufficient amplitude of movement for stability with STS and for improved mobility. Pt performs several reps, PT provides multi-modal cues including demo. ? ?Seated large amplitude twist with clap to promote improved rotation at trunk and UE mobility. Cuing for large amplitude movement with VC/TC/demo. Practiced in part and then whole x several minutes. Pt with greatest difficulty rotating to L side.  ? ?Gait training: pt using RW, PT provides CGA. Pt performed 1x148 ft with PT providing anterior pull to promote increased step-length B. TC/demo/VC for technique. Pt requires seated rest break following lap. ?-donned 3# weights each LE and performed another 1x148 ft. PT initiates set with anterior-pull and then cues pt to continue with increased step length independently. Pt able to sustain improved step length without anterior pull. ? ?Therex: ?Seated LAQ with 5# weights donned each LE - attempted, pt difficulty with sequencing on this date. Decreased to 3# weights each LE and pt able to complete 15x alternating. Cuing to increase amplitude of movement with LLE. ?  ?  ? ? ? PT Education - 01/20/22 1117   ? ? Education Details exercise technique, body mechanics   ? Person(s) Educated Patient   ? Methods Explanation;Demonstration;Tactile cues;Verbal cues   ?  Comprehension Verbalized understanding;Returned demonstration;Verbal cues required;Tactile cues required;Need further instruction   ? ?  ?  ? ?  ? ? ? PT Short Term Goals - 12/21/21 1551   ? ?  ? PT SHORT TERM GOAL #1  ? Title Pt will be independent with HEP in order to improve strength and balance in order to decrease fall risk and improve function at home and work.   ? Baseline 3/1: issued. focus on large amplitude STS (with spouse providing CGA)   ? Time 6   ? Period Weeks   ? Status New   ? Target Date 02/01/22   ?  ? PT SHORT TERM GOAL #2  ? Title --   ? Baseline --   ? Time --   ?  Period --   ? Status --   ? Target Date --   ?  ? PT SHORT TERM GOAL #3  ? Title --   ? Baseline --   ? Time --   ? Period --   ? Status --   ? Target Date --   ?  ? PT SHORT TERM GOAL #4  ? Title --   ? Time --   ? Period --   ? Status --   ? Target Date --   ?  ? PT SHORT TERM GOAL #5  ? Title --   ? Baseline --   ? Time --   ? Period --   ? Status --   ? Target Date --   ? ?  ?  ? ?  ? ? ? ? PT Long Term Goals - 12/21/21 1553   ? ?  ? PT LONG TERM GOAL #1  ? Title Pt will decrease 5TSTS by at least 3 seconds in order to demonstrate clinically significant improvement in LE strength.   ? Baseline 3/1: 29 sec with use of UEs to assist   ? Time 12   ? Period Weeks   ? Status New   ? Target Date 03/15/22   ?  ? PT LONG TERM GOAL #2  ? Title Pt will decrease TUG to below 14 seconds/decrease in order to demonstrate decreased fall risk.   ? Baseline 3/1: 49 sec   ? Time 12   ? Period Weeks   ? Status New   ? Target Date 03/15/22   ?  ? PT LONG TERM GOAL #3  ? Title Patient will increase BLE gross strength to 4+/5 as to improve functional strength for independent gait, increased standing tolerance and increased ADL ability.   ? Baseline 3/1: 4/5 in BLEs   ? Time 12   ? Period Weeks   ? Status New   ? Target Date 03/15/22   ?  ? PT LONG TERM GOAL #4  ? Title Patient will increase FOTO score to equal to or greater than  39   to demonstrate  statistically significant improvement in mobility and quality of life.   ? Baseline 3/1: 21   ? Time 12   ? Period Weeks   ? Status New   ? Target Date 03/15/22   ?  ? PT LONG TERM GOAL #5  ? Title Patient will in

## 2022-01-23 ENCOUNTER — Ambulatory Visit: Payer: Medicare HMO | Admitting: Physical Therapy

## 2022-01-27 ENCOUNTER — Ambulatory Visit: Payer: Medicare HMO | Attending: Neurology

## 2022-01-27 DIAGNOSIS — R278 Other lack of coordination: Secondary | ICD-10-CM | POA: Diagnosis present

## 2022-01-27 DIAGNOSIS — R2681 Unsteadiness on feet: Secondary | ICD-10-CM | POA: Diagnosis present

## 2022-01-27 DIAGNOSIS — R2689 Other abnormalities of gait and mobility: Secondary | ICD-10-CM | POA: Diagnosis present

## 2022-01-27 DIAGNOSIS — M25562 Pain in left knee: Secondary | ICD-10-CM | POA: Diagnosis present

## 2022-01-27 DIAGNOSIS — R262 Difficulty in walking, not elsewhere classified: Secondary | ICD-10-CM | POA: Diagnosis present

## 2022-01-27 DIAGNOSIS — M6281 Muscle weakness (generalized): Secondary | ICD-10-CM

## 2022-01-27 DIAGNOSIS — M25552 Pain in left hip: Secondary | ICD-10-CM | POA: Diagnosis present

## 2022-01-27 NOTE — Therapy (Signed)
[place] [facility] ?[place], [place], [address] ?Phone: [phone]   Fax:  [phone] ? ?Physical Therapy Treatment/Physical Therapy Progress Note ? ? ?Dates of reporting period  12/21/2021   to   01/27/2022 ? ? ?Patient Details  ?Name: Tina Bowman ?MRN: [id number] ?Date of Birth: 1946/09/17 ?Referring Provider (PT): Luella Cook, MD ? ? ?Encounter Date: 01/27/2022 ? ? PT End of Session - 01/27/22 1107   ? ? Visit Number 10   ? Number of Visits 25   ? Date for PT Re-Evaluation 03/15/22   ? Authorization Time Period 12/21/2021-03/15/2022   ? PT Start Time 1017   ? PT Stop Time 1100   ? PT Time Calculation (min) 43 min   ? Equipment Utilized During Treatment Gait belt   ? Activity Tolerance Patient tolerated treatment well   ? Behavior During Therapy [facility] for tasks assessed/performed   ? ?  ?  ? ?  ? ? ?Past Medical History:  ?Diagnosis Date  ? Arthritis   ? Asthma   ? COPD (chronic obstructive pulmonary disease) ([place])   ? Fibromyalgia   ? Heart disease   ? Hypertension   ? Parkinsonism ([place])   ? 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 Pt reported this dx has been recinded by the MD at the [facility] disorders clinic, and replaced with Atypical Parkinsonism..  ? Sleep apnea   ? Slurred speech   ? foggy minded  ? ? ?Past Surgical History:  ?Procedure Laterality Date  ? ABDOMINAL HYSTERECTOMY    ? APPENDECTOMY    ? BACK SURGERY    ? BREAST EXCISIONAL BIOPSY Left   ? 2000's  ? BREAST SURGERY    ? CATARACT EXTRACTION    ? Also, retina surgery  ? CHOLECYSTECTOMY    ? COLONOSCOPY N/A 05/11/2021  ? Procedure: COLONOSCOPY;  Surgeon: Toledo, Benay Pike, MD;  Location: [facility] ENDOSCOPY;  Service: Gastroenterology;  Laterality: N/A;  PARKINSON'S  ? EYE SURGERY    ? FRACTURE SURGERY    ? INTRAMEDULLARY (IM) NAIL INTERTROCHANTERIC Left 06/11/2021  ? Procedure: INTRAMEDULLARY (IM) NAIL INTERTROCHANTRIC;  Surgeon: Corky Mull, MD;  Location: [facility] ORS;   Service: Orthopedics;  Laterality: Left;  ? NARCOTIC PAIN PUMP IMPLANT    ? SHOULDER SURGERY    ? TONSILLECTOMY    ? TUBAL LIGATION    ? ? ?There were no vitals filed for this visit. ? ? Subjective Assessment - 01/27/22 1019   ? ? Subjective Pt spouse reports pt fell yesterday in their bedroom. He reports no injuries with the fall but is unsure of how she fell.  Pt does report L knee pain today, but reports this is not new and from  past surgery.   ? Patient is accompained by: --   spouse  ? Pertinent History Pt is a 76 y/o female who presents to PT evaluation with her spouse. Pt ambulating with U-walker, and her spouse is providing CGA with gait belt donned. Per spouse, pt diagnosed with progressive supranuclear palsy in 2019. Pt was going to attend outpatient PT earlier last year, but fell in August 2022 and fractured L hip (s/p IMN 06/11/2021). Pt spouse reports fx is fully healed and she has no current restrictions. Pt spent 32 days in rehab following fx and then received HH PT.  Pt current symptoms include unsteadiness, impaired gait, decreased strength and L hip pain. Pt spouse reports pt has had 1 fall  in last 6 months. Pt tends to lose her balance posteriorly. Pt requires assist for majority of ADLs: dressing, bathing, meal prep. Her spouse does state, "I let her do as much as she can possibly do." Pt can still eat independently and denies difficulties with swallowing. Pt reports her vision is affected. She is constantly dizzy (per spouse, this was one of pt's first symptoms of PSP). In addition to U-walker, pt has lift chair and recliner at home. Pt has difficulty getting up her ramp to enter her home so her spouse assists her. Per chart other PMH includes: sleep apnea, HTN, COPD, CAD, fibromyalgia, early dementia, displaced L intertrochanteric femur fracture.   ? Limitations Standing;Walking;House hold activities;Lifting;Writing;Reading   ? How long can you sit comfortably? not limited   ? How long can you  stand comfortably? 15 minutes, pt reports "I feel unsteady." Pt must always hold on to something   ? How long can you walk comfortably? Pt fatigues quickly.   ? Diagnostic tests refer to imaging from Aug 2022 per chart   ? Patient Stated Goals Pt would like to improve walking, balance, gait   ? Currently in Pain? Yes   ? Pain Onset More than a month ago   ? ?  ?  ? ?  ? ?INTERVENTIONS -  ? ?MMT: BLE ?Gross BLE strength is 4+/5 (achieved)  ? ?FOTO: 31 (improved) ? ?10MWT: 0.54 sec (improved) ? ?TUG: 46.8 sec (slight improvement) ?Instructed through warm-up trial of 63 sec. ? ? ?5xSTS: ?Warm-up trial with and without UEs - 51.7 sec  ?With use of BUEs 30 sec (previously 29 sec) ? ?Other interventions- ?Instructed pt in large-amplitude technique for sit<>stands x multiple reps with multi-modal cuing including modeling and TC. ? ?Pt educated throughout session about proper posture and technique with exercises. Improved exercise technique, movement at target joints, use of target muscles after min to mod verbal, visual, tactile cues. ? PT Education - 01/27/22 1106   ? ? Education Details exercise technique, goals   ? Person(s) Educated Patient   ? Methods Explanation;Demonstration;Tactile cues;Verbal cues   ? Comprehension Verbalized understanding;Returned demonstration;Verbal cues required;Tactile cues required;Need further instruction   ? ?  ?  ? ?  ? ? ? ? PT Short Term Goals - 01/27/22 1105   ? ?  ? PT SHORT TERM GOAL #1  ? Title Pt will be independent with HEP in order to improve strength and balance in order to decrease fall risk and improve function at home and work.   ? Baseline 3/1: issued. focus on large amplitude STS (with spouse providing CGA); 4/7: pt not yet independent   ? Time 6   ? Period Weeks   ? Status On-going   ? Target Date 02/01/22   ? ?  ?  ? ?  ? ? ? ? PT Long Term Goals - 01/27/22 1025   ? ?  ? PT LONG TERM GOAL #1  ? Title Pt will decrease 5TSTS by at least 3 seconds in order to demonstrate  clinically significant improvement in LE strength.   ? Baseline 3/1: 29 sec with use of UEs to assist; 4/7: 30 sec with use of UEs to assist   ? Time 12   ? Period Weeks   ? Status On-going   ? Target Date 03/15/22   ?  ? PT LONG TERM GOAL #2  ? Title Pt will decrease TUG to below 14 seconds/decrease in order to demonstrate decreased fall risk.   ?  Baseline 3/1: 49 sec; 4/7: 46.8 seconds   ? Time 12   ? Period Weeks   ? Status On-going   ? Target Date 03/15/22   ?  ? PT LONG TERM GOAL #3  ? Title Patient will increase BLE gross strength to 4+/5 as to improve functional strength for independent gait, increased standing tolerance and increased ADL ability.   ? Baseline 3/1: 4/5 in BLEs, 4/7: 4+/5 in BLEs   ? Time 12   ? Period Weeks   ? Status Achieved   ? Target Date 03/15/22   ?  ? PT LONG TERM GOAL #4  ? Title Patient will increase FOTO score to equal to or greater than  39   to demonstrate statistically significant improvement in mobility and quality of life.   ? Baseline 3/1: 21; 4/7: 31   ? Time 12   ? Period Weeks   ? Status On-going   ? Target Date 03/15/22   ?  ? PT LONG TERM GOAL #5  ? Title Patient will increase 10 meter walk test to >0.8 m/s as to improve gait speed for better community ambulation and to reduce fall risk.   ? Baseline 3/1: 0.32 with u-step walker; 0.54 m/s with u-step walker   ? Time 12   ? Period Weeks   ? Status On-going   ? Target Date 03/15/22   ? ?  ?  ? ?  ? ? ? ? ? ? ? ? Plan - 01/27/22 1107   ? ? Clinical Impression Statement Goals retested for progress note. Pt making gains AEB improvements with MMT, 10MWT, TUG and FOTO, where pt achieved MMT goal. This indicates improved LE strength, gait speed/ability and overall perceived functional mobility and QOL. While pt making gains 5xSTS score was 30 (previously 29) seconds. Remainder of session focused on instrucing pt in correct large, amplitude STS technique to address this deficit. Patient's condition has the potential to improve in  response to therapy. Maximum improvement is yet to be obtained. The anticipated improvement is attainable and reasonable in a generally predictable time. The pt will benefit from further skilled PT to impro

## 2022-01-28 ENCOUNTER — Encounter: Payer: Self-pay | Admitting: Emergency Medicine

## 2022-01-28 ENCOUNTER — Emergency Department: Payer: Medicare HMO

## 2022-01-28 ENCOUNTER — Other Ambulatory Visit: Payer: Self-pay

## 2022-01-28 ENCOUNTER — Emergency Department
Admission: EM | Admit: 2022-01-28 | Discharge: 2022-01-28 | Disposition: A | Discharge status: Home / Self Care | Payer: Medicare HMO | Attending: Emergency Medicine | Admitting: Emergency Medicine

## 2022-01-28 DIAGNOSIS — J45909 Unspecified asthma, uncomplicated: Secondary | ICD-10-CM | POA: Diagnosis not present

## 2022-01-28 DIAGNOSIS — W19XXXA Unspecified fall, initial encounter: Secondary | ICD-10-CM

## 2022-01-28 DIAGNOSIS — J449 Chronic obstructive pulmonary disease, unspecified: Secondary | ICD-10-CM | POA: Diagnosis not present

## 2022-01-28 DIAGNOSIS — M25552 Pain in left hip: Secondary | ICD-10-CM | POA: Diagnosis not present

## 2022-01-28 DIAGNOSIS — S40011A Contusion of right shoulder, initial encounter: Secondary | ICD-10-CM | POA: Diagnosis not present

## 2022-01-28 DIAGNOSIS — G20 Parkinson's disease: Secondary | ICD-10-CM | POA: Insufficient documentation

## 2022-01-28 DIAGNOSIS — S4991XA Unspecified injury of right shoulder and upper arm, initial encounter: Secondary | ICD-10-CM | POA: Diagnosis present

## 2022-01-28 DIAGNOSIS — I10 Essential (primary) hypertension: Secondary | ICD-10-CM | POA: Diagnosis not present

## 2022-01-28 DIAGNOSIS — W01198A Fall on same level from slipping, tripping and stumbling with subsequent striking against other object, initial encounter: Secondary | ICD-10-CM | POA: Diagnosis not present

## 2022-01-28 DIAGNOSIS — Y92003 Bedroom of unspecified non-institutional (private) residence as the place of occurrence of the external cause: Secondary | ICD-10-CM | POA: Insufficient documentation

## 2022-01-28 LAB — URINALYSIS, COMPLETE (UACMP) WITH MICROSCOPIC
Bilirubin Urine: NEGATIVE
Glucose, UA: NEGATIVE mg/dL
Ketones, ur: NEGATIVE mg/dL
Leukocytes,Ua: NEGATIVE
Nitrite: NEGATIVE
Protein, ur: NEGATIVE mg/dL
Specific Gravity, Urine: 1.02 (ref 1.005–1.030)
pH: 6 (ref 5.0–8.0)

## 2022-01-28 NOTE — ED Triage Notes (Signed)
Pt via POV from home. Pt c/o L shoulder and L hip pain after a fall on Thursday. Pt has a hx of Parkinsons and pt had a mechanical fall. States that she did hit her head. No LOC. Denies blood thinners. Husband states that she went to rehab on Friday and did fine. Pt is A&OX4 and NAD.  ?

## 2022-01-28 NOTE — ED Provider Notes (Signed)
? ?[facility] ?Provider Note ? ? ? Event Date/Time  ? First MD Initiated Contact with Patient 01/28/22 1257   ?  (approximate) ? ? ?History  ? ?Fall ? ? ?HPI ? ?Tina Bowman is a 76 y.o. female with past medical history of supranuclear palsy, Parkinson's disease, sleep apnea, COPD who presents with hip pain after a fall.  Patient fell on Thursday, 2 days ago.  This was unwitnessed she was in the bedroom and was changing positions she fell onto her left hip.  Did hit her head on the way down on the bed.  Patient denies loss of consciousness.  Her husband said she initially had some headache but this is improved.  He notes that she went to physical therapy yesterday and did quite well.  However since being home her walking has been somewhat of an issue.  She typically walks with a walker which she is still able to do but seems to be shuffling more.  Had been having improvement in her gait in general after working with physical therapy but seems to have gotten somewhat worse.  Patient complaining of left hip pain.  Otherwise has been herself there is been no change in mental status she is eating and drinking well no fevers denies pain with urination or abdominal pain. ?  ? ?Past Medical History:  ?Diagnosis Date  ? Arthritis   ? Asthma   ? COPD (chronic obstructive pulmonary disease) ([facility])   ? Fibromyalgia   ? Heart disease   ? Hypertension   ? Parkinsonism ([facility])   ? 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 Pt reported this dx has been recinded by the MD at the [facility] disorders clinic, and replaced with Atypical Parkinsonism..  ? Sleep apnea   ? Slurred speech   ? foggy minded  ? ? ?Patient Active Problem List  ? Diagnosis Date Noted  ? Hip fracture ([facility]) 06/11/2021  ? Confusion 06/21/2017  ? ? ? ?Physical Exam  ?Triage Vital Signs: ?ED Triage Vitals  ?Enc Vitals Group  ?   BP 01/28/22 1143 (!) 139/59  ?   Pulse Rate 01/28/22 1143 72  ?   Resp 01/28/22 1143 18  ?   Temp  01/28/22 1143 97.7 ?F (36.5 ?C)  ?   Temp Source 01/28/22 1143 Oral  ?   SpO2 01/28/22 1143 95 %  ?   Weight 01/28/22 1142 160 lb (72.6 kg)  ?   Height 01/28/22 1142 5\' 2"  (1.575 m)  ?   Head Circumference --   ?   Peak Flow --   ?   Pain Score --   ?   Pain Loc --   ?   Pain Edu? --   ?   Excl. in GC? --   ? ? ?Most recent vital signs: ?Vitals:  ? 01/28/22 1143  ?BP: (!) 139/59  ?Pulse: 72  ?Resp: 18  ?Temp: 97.7 ?F (36.5 ?C)  ?SpO2: 95%  ? ? ? ?General: Awake, no distress.  ?CV:  Good peripheral perfusion.  ?Resp:  Normal effort.  No increased work of breathing ?Abd:  No distention.  Abdomen is soft and nontender throughout ?Neuro:             Awake, Alert, Oriented x 3  ?Other:  There is ecchymosis over the right trap, no shoulder deformity, normal range of motion of the shoulder no midline C-spine tenderness no trauma to the head ?Chest wall is nontender, abdomen  soft nontender ?Mild tenderness to the left hip, pelvis is stable ?Patient able to range hips bilaterally without significant discomfort ?No other swelling or pain of the knees thighs or tib-fib's ? ? ?ED Results / Procedures / Treatments  ?Labs ?(all labs ordered are listed, but only abnormal results are displayed) ?Labs Reviewed  ?URINALYSIS, COMPLETE (UACMP) WITH MICROSCOPIC - Abnormal; Notable for the following components:  ?    Result Value  ? Color, Urine YELLOW (*)   ? APPearance CLEAR (*)   ? Hgb urine dipstick SMALL (*)   ? Bacteria, UA RARE (*)   ? All other components within normal limits  ? ? ? ?EKG ? ? ? ? ?RADIOLOGY ?I reviewed the CT scan of the brain which does not show any acute intracranial process; agree with radiology report  ? ? ? ?PROCEDURES: ? ?Critical Care performed: No ? ?Procedures ? ? ?MEDICATIONS ORDERED IN ED: ?Medications - No data to display ? ? ?IMPRESSION / MDM / ASSESSMENT AND PLAN / ED COURSE  ?I reviewed the triage vital signs and the nursing notes. ?             ?               ? ?Differential diagnosis includes,  but is not limited to, contusion, muscle strain, hip fracture, UTI, cranial injury ? ?Is a 76 year old female with progressive supranuclear palsy who presents with left hip pain after a fall.  Patient had an unwitnessed fall in the bedroom 2 days ago on Thursday of this week.  Did hit her head did not lose consciousness.  Patiently initially had some mild headache and this is resolved.  Was able to work with PT yesterday and actually did quite well but since coming home from PT has had more of a shuffling gait difficulty getting around.  Also complaining of left hip pain.  On exam patient does have some ecchymosis over the right trap with no midline C-spine tenderness no signs of trauma to the head chest or abdomen.  Left hip mildly tender to palpation however she is able to range it without difficulty there is no tenderness of the knee or ankle.  X-ray of the left hip and pelvis did not show any acute fracture she has a prior left hip IM nail on the hardware appears to be in place.  Obtain CT head and C-spine given patient somewhat difficult to assess and these are without acute injury.  I watched patient get up to wheelchair and she was able to weight-bear.  Patient's husband notes that she is at baseline otherwise.  Did check urine just to ensure that there is no other acute infectious process that could be contributing to her decline.  UA is clean.  Vital signs within normal limits she has no respiratory or GI symptoms do not feel that further laboratory work is necessary at this time.  Suspect muscle injury.  Return precautions discussed.  Patient will follow-up with PCP and physical therapy. ? ?  ? ? ?FINAL CLINICAL IMPRESSION(S) / ED DIAGNOSES  ? ?Final diagnoses:  ?Fall, initial encounter  ? ? ? ?Rx / DC Orders  ? ?ED Discharge Orders   ? ? None  ? ?  ? ? ? ?Note:  This document was prepared using Dragon voice recognition software and may include unintentional dictation errors. ?  ?Georga Hacking,  MD ?01/28/22 1514 ? ?

## 2022-01-28 NOTE — Discharge Instructions (Signed)
Your x-rays did not show any broken bones or injuries to your head or neck.  The urine sample was not indicative of infection.  You can take Tylenol for pain.  If you have develop worsening pain or unable to walk or develop any new symptoms that are concerning to you such as fever or vomiting, please return to the emergency department.  Otherwise please follow-up with your primary care provider and continue physical therapy. ?

## 2022-01-30 ENCOUNTER — Ambulatory Visit: Payer: Medicare HMO

## 2022-02-02 ENCOUNTER — Ambulatory Visit: Payer: Medicare HMO | Admitting: Physical Therapy

## 2022-02-07 ENCOUNTER — Encounter: Payer: Self-pay | Admitting: Physical Therapy

## 2022-02-07 ENCOUNTER — Ambulatory Visit: Payer: Medicare HMO | Admitting: Physical Therapy

## 2022-02-07 DIAGNOSIS — M6281 Muscle weakness (generalized): Secondary | ICD-10-CM

## 2022-02-07 DIAGNOSIS — M25562 Pain in left knee: Secondary | ICD-10-CM

## 2022-02-07 DIAGNOSIS — R2681 Unsteadiness on feet: Secondary | ICD-10-CM

## 2022-02-07 DIAGNOSIS — R278 Other lack of coordination: Secondary | ICD-10-CM

## 2022-02-07 DIAGNOSIS — R262 Difficulty in walking, not elsewhere classified: Secondary | ICD-10-CM

## 2022-02-07 DIAGNOSIS — M25552 Pain in left hip: Secondary | ICD-10-CM

## 2022-02-07 DIAGNOSIS — R2689 Other abnormalities of gait and mobility: Secondary | ICD-10-CM

## 2022-02-09 ENCOUNTER — Ambulatory Visit: Payer: Medicare HMO | Admitting: Physical Therapy

## 2022-02-09 NOTE — Therapy (Signed)
[place] [facility] ?[address] ?[place], [place], [address] ?Phone: [phone]   Fax:  [phone] ? ?Physical Therapy Treatment ? ?Patient Details  ?Name: Tina Bowman ?MRN: [id number] ?Date of Birth: Apr 11, 1946 ?Referring Provider (PT): Peyton Bottoms, MD ? ? ?Encounter Date: 02/07/2022 ? ? PT End of Session - 02/09/22 0175   ? ? Visit Number 11   ? Number of Visits 25   ? Date for PT Re-Evaluation 03/15/22   ? Authorization Time Period 12/21/2021-03/15/2022   ? PT Start Time 1348   ? PT Stop Time 1428   ? PT Time Calculation (min) 40 min   ? Equipment Utilized During Treatment Gait belt   ? Activity Tolerance Patient tolerated treatment well   ? Behavior During Therapy [facility] for tasks assessed/performed   ? ?  ?  ? ?  ? ? ?Past Medical History:  ?Diagnosis Date  ? Arthritis   ? Asthma   ? COPD (chronic obstructive pulmonary disease) (HCC)   ? Fibromyalgia   ? Heart disease   ? Hypertension   ? Parkinsonism ([facility])   ? 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 Pt reported this dx has been recinded by the MD at the [facility] disorders clinic, and replaced with Atypical Parkinsonism..  ? Sleep apnea   ? Slurred speech   ? foggy minded  ? ? ?Past Surgical History:  ?Procedure Laterality Date  ? ABDOMINAL HYSTERECTOMY    ? APPENDECTOMY    ? BACK SURGERY    ? BREAST EXCISIONAL BIOPSY Left   ? 2000's  ? BREAST SURGERY    ? CATARACT EXTRACTION    ? Also, retina surgery  ? CHOLECYSTECTOMY    ? COLONOSCOPY N/A 05/11/2021  ? Procedure: COLONOSCOPY;  Surgeon: Toledo, Boykin Nearing, MD;  Location: [facility] ENDOSCOPY;  Service: Gastroenterology;  Laterality: N/A;  PARKINSON'S  ? EYE SURGERY    ? FRACTURE SURGERY    ? INTRAMEDULLARY (IM) NAIL INTERTROCHANTERIC Left 06/11/2021  ? Procedure: INTRAMEDULLARY (IM) NAIL INTERTROCHANTRIC;  Surgeon: Christena Flake, MD;  Location: [facility] ORS;  Service: Orthopedics;  Laterality: Left;  ? NARCOTIC PAIN PUMP IMPLANT    ? SHOULDER SURGERY     ? TONSILLECTOMY    ? TUBAL LIGATION    ? ? ?There were no vitals filed for this visit. ? ? Subjective Assessment - 02/09/22 0822   ? ? Subjective Patient's spouse reports she fell almost 2 weeks ago and after therapy on 01/27/22 she became weak and had a hard time moving. She is now only walking short distances with her walker and has a high fear of falling;   ? Patient is accompained by: --   spouse  ? Pertinent History Pt is a 76 y/o female who presents to PT evaluation with her spouse. Pt ambulating with U-walker, and her spouse is providing CGA with gait belt donned. Per spouse, pt diagnosed with progressive supranuclear palsy in 2019. Pt was going to attend outpatient PT earlier last year, but fell in August 2022 and fractured L hip (s/p IMN 06/11/2021). Pt spouse reports fx is fully healed and she has no current restrictions. Pt spent 32 days in rehab following fx and then received HH PT.  Pt current symptoms include unsteadiness, impaired gait, decreased strength and L hip pain. Pt spouse reports pt has had 1 fall in last 6 months. Pt tends to lose her balance posteriorly. Pt requires assist for majority of ADLs: dressing, bathing,  meal prep. Her spouse does state, "I let her do as much as she can possibly do." Pt can still eat independently and denies difficulties with swallowing. Pt reports her vision is affected. She is constantly dizzy (per spouse, this was one of pt's first symptoms of PSP). In addition to U-walker, pt has lift chair and recliner at home. Pt has difficulty getting up her ramp to enter her home so her spouse assists her. Per chart other PMH includes: sleep apnea, HTN, COPD, CAD, fibromyalgia, early dementia, displaced L intertrochanteric femur fracture.   ? Limitations Standing;Walking;House hold activities;Lifting;Writing;Reading   ? How long can you sit comfortably? not limited   ? How long can you stand comfortably? 15 minutes, pt reports "I feel unsteady." Pt must always hold on to  something   ? How long can you walk comfortably? Pt fatigues quickly.   ? Diagnostic tests refer to imaging from Aug 2022 per chart   ? Patient Stated Goals Pt would like to improve walking, balance, gait   ? Currently in Pain? Yes   ? Pain Score --   unable to rate, reports "a lot"  ? Pain Location Hip   ? Pain Orientation Left   ? Pain Descriptors / Indicators Aching;Sore   ? Pain Type Chronic pain   ? Pain Onset More than a month ago   ? Pain Frequency Intermittent   ? Aggravating Factors  standing/walking   ? Pain Relieving Factors rest   ? Effect of Pain on Daily Activities decreased activity tolerance;   ? Multiple Pain Sites No   ? ?  ?  ? ?  ? ? ?TREATMENT: ?Patient required mod A +1 for transfer wheelchair to Nustep with increased time/effort ? ?Patient instructed in nustep BUE/BLE level 2 x4 min with cues to keep steps per minute >50 to challenge cardiovascular conditioning. Patient reports increased LLE discomfort during exercise, often grimacing; ? ?She ambulated 10 feet to parallel bars with RW, min-mod A +1 with heavy antalgic gait LLE with short stance on left leg and decreased step length RLE; Pt ambulates with flexed LLE appearing as though it would buckle ?Caregiver reports she has been walking slower and having a harder time getting around since her fall. He reports she has not been complaining of pain today and that she did dress herself earlier this morning. Patient does have some cognitive impairments and has a hard time describing pain and rating pain. She states, "It hurts a lot." When she talks about her pain she becomes tearful. ? ?Patient has been having pain off/on for a while but this is the first time she has been tearful and exhibited this antalgic pattern.  ? ?Caregiver reports they did go to emergency room after fall and had X-rays which were negative for fracture. ? ?Instructed patient in walking in parallel bars x1 lap with moderate difficulty and heavy antalgic gait  pattern ?Instructed patient in side stepping, patient only able to take 2 steps and then becomes tearful and states, "I can't do it." ? ?Concerned about high levels of pain. Educated patient/caregiver in ways to help with pain control including to use heat/ice. Recommend patient do gentle activity to avoid stiffness but also avoid heavy levels of pain; ? ? ? ? ? ? ? ? ? ? ? ? ? ? ? ? ? ? ? ? ? ? ? ? ? ? ? PT Education - 02/09/22 0812   ? ? Education Details exercise technique/pain control;   ?  Person(s) Educated Patient   ? Methods Explanation;Verbal cues   ? Comprehension Verbalized understanding;Returned demonstration;Tactile cues required;Verbal cues required   ? ?  ?  ? ?  ? ? ? PT Short Term Goals - 01/27/22 1105   ? ?  ? PT SHORT TERM GOAL #1  ? Title Pt will be independent with HEP in order to improve strength and balance in order to decrease fall risk and improve function at home and work.   ? Baseline 3/1: issued. focus on large amplitude STS (with spouse providing CGA); 4/7: pt not yet independent   ? Time 6   ? Period Weeks   ? Status On-going   ? Target Date 02/01/22   ? ?  ?  ? ?  ? ? ? ? PT Long Term Goals - 01/27/22 1025   ? ?  ? PT LONG TERM GOAL #1  ? Title Pt will decrease 5TSTS by at least 3 seconds in order to demonstrate clinically significant improvement in LE strength.   ? Baseline 3/1: 29 sec with use of UEs to assist; 4/7: 30 sec with use of UEs to assist   ? Time 12   ? Period Weeks   ? Status On-going   ? Target Date 03/15/22   ?  ? PT LONG TERM GOAL #2  ? Title Pt will decrease TUG to below 14 seconds/decrease in order to demonstrate decreased fall risk.   ? Baseline 3/1: 49 sec; 4/7: 46.8 seconds   ? Time 12   ? Period Weeks   ? Status On-going   ? Target Date 03/15/22   ?  ? PT LONG TERM GOAL #3  ? Title Patient will increase BLE gross strength to 4+/5 as to improve functional strength for independent gait, increased standing tolerance and increased ADL ability.   ? Baseline 3/1: 4/5 in  BLEs, 4/7: 4+/5 in BLEs   ? Time 12   ? Period Weeks   ? Status Achieved   ? Target Date 03/15/22   ?  ? PT LONG TERM GOAL #4  ? Title Patient will increase FOTO score to equal to or greater than  39   to [employer]

## 2022-02-14 ENCOUNTER — Ambulatory Visit: Payer: Medicare HMO | Admitting: Physical Therapy

## 2022-02-17 ENCOUNTER — Ambulatory Visit: Payer: Medicare HMO

## 2022-02-21 ENCOUNTER — Ambulatory Visit: Payer: Medicare HMO

## 2022-02-24 ENCOUNTER — Ambulatory Visit: Payer: Medicare HMO

## 2022-02-27 ENCOUNTER — Ambulatory Visit: Payer: Medicare HMO | Admitting: Physical Therapy

## 2022-03-01 ENCOUNTER — Ambulatory Visit: Payer: Medicare HMO

## 2022-03-03 ENCOUNTER — Other Ambulatory Visit: Payer: Self-pay | Admitting: Internal Medicine

## 2022-03-03 DIAGNOSIS — Z1231 Encounter for screening mammogram for malignant neoplasm of breast: Secondary | ICD-10-CM

## 2022-03-06 ENCOUNTER — Ambulatory Visit: Payer: Medicare HMO

## 2022-03-08 ENCOUNTER — Ambulatory Visit: Payer: Medicare HMO | Admitting: Physical Therapy

## 2022-03-13 ENCOUNTER — Ambulatory Visit: Payer: Medicare HMO | Admitting: Physical Therapy

## 2022-03-14 ENCOUNTER — Ambulatory Visit: Payer: Self-pay | Admitting: Student

## 2022-03-15 ENCOUNTER — Ambulatory Visit: Payer: Medicare HMO | Admitting: Physical Therapy

## 2022-03-16 NOTE — Pre-Procedure Instructions (Signed)
Surgical Instructions    Your procedure is scheduled on Wednesday 03/22/22.   Report to [facility] Main Entrance "A" at 05:30 A.M., then check in with the Admitting office.  Call this number if you have problems the morning of surgery:  [phone]   If you have any questions prior to your surgery date call [phone]: Open Monday-Friday 8am-4pm    Remember:  Do not eat or drink after midnight the night before your surgery    Take these medicines the morning of surgery with A SIP OF WATER:   carbidopa-levodopa (SINEMET IR)  diltiazem (CARDIZEM CD)   pantoprazole (PROTONIX)   trospium (SANCTURA)   Take these medicines if needed:   acetaminophen (TYLENOL)  albuterol (PROVENTIL HFA;VENTOLIN HFA)  ANORO ELLIPTA 62.5-25 MCG/ACT AEPB      As of today, STOP taking any Aspirin (unless otherwise instructed by your surgeon) Aleve, Naproxen, Ibuprofen, Motrin, Advil, Goody's, BC's, all herbal medications, fish oil, and all vitamins.           Do not wear jewelry or makeup Do not wear lotions, powders, perfumes/colognes, or deodorant. Do not shave 48 hours prior to surgery.  Men may shave face and neck. Do not bring valuables to the hospital. Do not wear nail polish, gel polish, artificial nails, or any other type of covering on natural nails (fingers and toes) If you have artificial nails or gel coating that need to be removed by a nail salon, please have this removed prior to surgery. Artificial nails or gel coating may interfere with anesthesia's ability to adequately monitor your vital signs.  [place] is not responsible for any belongings or valuables. .   Do NOT Smoke (Tobacco/Vaping)  24 hours prior to your procedure  If you use a CPAP at night, you may bring your mask for your overnight stay.   Contacts, glasses, hearing aids, dentures or partials may not be worn into surgery, please bring cases for these belongings   For patients admitted to the hospital, discharge  time will be determined by your treatment team.   Patients discharged the day of surgery will not be allowed to drive home, and someone needs to stay with them for 24 hours.   SURGICAL WAITING ROOM VISITATION Patients having surgery or a procedure in a hospital may have two support people. Children under the age of 88 must have an adult with them who is not the patient. They may stay in the waiting area during the procedure and may switch out with other visitors. If the patient needs to stay at the hospital during part of their recovery, the visitor guidelines for inpatient rooms apply.  Please refer to the [facility] website for the visitor guidelines for Inpatients (after your surgery is over and you are in a regular room).       Special instructions:    Oral Hygiene is also important to reduce your risk of infection.  Remember - BRUSH YOUR TEETH THE MORNING OF SURGERY WITH YOUR REGULAR TOOTHPASTE   [place]- Preparing For Surgery  Before surgery, you can play an important role. Because skin is not sterile, your skin needs to be as free of germs as possible. You can reduce the number of germs on your skin by washing with CHG (chlorahexidine gluconate) Soap before surgery.  CHG is an antiseptic cleaner which kills germs and bonds with the skin to continue killing germs even after washing.     Please do not use if you have an allergy  to CHG or antibacterial soaps. If your skin becomes reddened/irritated stop using the CHG.  Do not shave (including legs and underarms) for at least 48 hours prior to first CHG shower. It is OK to shave your face.  Please follow these instructions carefully.     Shower the NIGHT BEFORE SURGERY and the MORNING OF SURGERY with CHG Soap.   If you chose to wash your hair, wash your hair first as usual with your normal shampoo. After you shampoo, rinse your hair and body thoroughly to remove the shampoo.  Then [employer] and genitals (private parts) with  your normal soap and rinse thoroughly to remove soap.  After that Use CHG Soap as you would any other liquid soap. You can apply CHG directly to the skin and wash gently with a scrungie or a clean washcloth.   Apply the CHG Soap to your body ONLY FROM THE NECK DOWN.  Do not use on open wounds or open sores. Avoid contact with your eyes, ears, mouth and genitals (private parts). Wash Face and genitals (private parts)  with your normal soap.   Wash thoroughly, paying special attention to the area where your surgery will be performed.  Thoroughly rinse your body with warm water from the neck down.  DO NOT shower/wash with your normal soap after using and rinsing off the CHG Soap.  Pat yourself dry with a CLEAN TOWEL.  Wear CLEAN PAJAMAS to bed the night before surgery  Place CLEAN SHEETS on your bed the night before your surgery  DO NOT SLEEP WITH PETS.   Day of Surgery:  Take a shower with CHG soap. Wear Clean/Comfortable clothing the morning of surgery Do not apply any deodorants/lotions.   Remember to brush your teeth WITH YOUR REGULAR TOOTHPASTE.    If you received a COVID test during your pre-op visit, it is requested that you wear a mask when out in public, stay away from anyone that may not be feeling well, and notify your surgeon if you develop symptoms. If you have been in contact with anyone that has tested positive in the last 10 days, please notify your surgeon.    Please read over the following fact sheets that you were given.

## 2022-03-17 ENCOUNTER — Encounter (HOSPITAL_COMMUNITY): Payer: Self-pay

## 2022-03-17 ENCOUNTER — Other Ambulatory Visit: Payer: Self-pay

## 2022-03-17 ENCOUNTER — Encounter (HOSPITAL_COMMUNITY)
Admission: RE | Admit: 2022-03-17 | Discharge: 2022-03-17 | Disposition: A | Discharge status: Home / Self Care | Payer: Medicare HMO | Source: Ambulatory Visit | Attending: Student | Admitting: Student

## 2022-03-17 VITALS — BP 148/71 | HR 95 | Temp 98.2°F | Resp 18 | Ht 62.0 in | Wt 160.0 lb

## 2022-03-17 DIAGNOSIS — Z01812 Encounter for preprocedural laboratory examination: Secondary | ICD-10-CM | POA: Diagnosis not present

## 2022-03-17 DIAGNOSIS — Z01818 Encounter for other preprocedural examination: Secondary | ICD-10-CM

## 2022-03-17 HISTORY — DX: Gastro-esophageal reflux disease without esophagitis: K21.9

## 2022-03-17 LAB — CBC
HCT: 41 % (ref 36.0–46.0)
Hemoglobin: 13.5 g/dL (ref 12.0–15.0)
MCH: 31.6 pg (ref 26.0–34.0)
MCHC: 32.9 g/dL (ref 30.0–36.0)
MCV: 96 fL (ref 80.0–100.0)
Platelets: 205 10*3/uL (ref 150–400)
RBC: 4.27 MIL/uL (ref 3.87–5.11)
RDW: 12.9 % (ref 11.5–15.5)
WBC: 5.9 10*3/uL (ref 4.0–10.5)
nRBC: 0 % (ref 0.0–0.2)

## 2022-03-17 LAB — BASIC METABOLIC PANEL
Anion gap: 10 (ref 5–15)
BUN: 15 mg/dL (ref 8–23)
CO2: 26 mmol/L (ref 22–32)
Calcium: 9.5 mg/dL (ref 8.9–10.3)
Chloride: 103 mmol/L (ref 98–111)
Creatinine, Ser: 0.78 mg/dL (ref 0.44–1.00)
GFR, Estimated: >60 mL/min (ref >60)
Glucose, Bld: 101 mg/dL — ABNORMAL HIGH (ref 70–99)
Potassium: 3.1 mmol/L — ABNORMAL LOW (ref 3.5–5.1)
Sodium: 139 mmol/L (ref 135–145)

## 2022-03-17 NOTE — Progress Notes (Signed)
PCP - Dr. Marcello Fennel Cardiologist - denies  Chest x-ray - 06/11/21 EKG - 06/11/21  SA - yes, wears CPAP  Anesthesia review: n/a  Patient denies shortness of breath, fever, cough and chest pain at PAT appointment   All instructions explained to the patient, with a verbal understanding of the material. Patient agrees to go over the instructions while at home for a better understanding. Patient also instructed to self quarantine after being tested for COVID-19. The opportunity to ask questions was provided.  *Please allow Tina Bowman (husband) to come back with patient on DOS to help answer questions.

## 2022-03-17 NOTE — Pre-Procedure Instructions (Signed)
Surgical Instructions    Your procedure is scheduled on Wednesday 03/22/22.   Report to [facility] Main Entrance "A" at 05:30 A.M., then check in with the Admitting office.  Call this number if you have problems the morning of surgery:  [phone]   If you have any questions prior to your surgery date call [phone]: Open Monday-Friday 8am-4pm    Remember:  Do not eat or drink after midnight the night before your surgery    Take these medicines the morning of surgery with A SIP OF WATER:   carbidopa-levodopa (SINEMET IR)  diltiazem (CARDIZEM CD)   pantoprazole (PROTONIX)   trospium (SANCTURA)   Take these medicines if needed:   acetaminophen (TYLENOL)  albuterol inhaler  ANORO ELLIPTA inhaler    As of today, STOP taking any Aspirin (unless otherwise instructed by your surgeon) Aleve, Naproxen, Ibuprofen, Motrin, Advil, Goody's, BC's, all herbal medications, fish oil, and all vitamins.           DAY OF SURGERY: Do not wear jewelry or makeup Do not wear lotions, powders, perfumes, or deodorant. Do not shave 48 hours prior to surgery. Do not bring valuables to the hospital. Do not wear nail polish, gel polish, artificial nails, or any other type of covering on natural nails (fingers and toes) If you have artificial nails or gel coating that need to be removed by a nail salon, please have this removed prior to surgery. Artificial nails or gel coating may interfere with anesthesia's ability to adequately monitor your vital signs.  [facility] is not responsible for any belongings or valuables. .   Do NOT Smoke (Tobacco/Vaping)  24 hours prior to your procedure  If you use a CPAP at night, you may bring your mask for your overnight stay.   Contacts, glasses, hearing aids, dentures or partials may not be worn into surgery, please bring cases for these belongings   For patients admitted to the hospital, discharge time will be determined by your treatment team.   Patients  discharged the day of surgery will not be allowed to drive home, and someone needs to stay with them for 24 hours.   SURGICAL WAITING ROOM VISITATION Patients having surgery or a procedure in a hospital may have two support people. Children under the age of 47 must have an adult with them who is not the patient. They may stay in the waiting area during the procedure and may switch out with other visitors. If the patient needs to stay at the hospital during part of their recovery, the visitor guidelines for inpatient rooms apply.  Please refer to the [facility] website for the visitor guidelines for Inpatients (after your surgery is over and you are in a regular room).    Special instructions:    Oral Hygiene is also important to reduce your risk of infection.  Remember - BRUSH YOUR TEETH THE MORNING OF SURGERY WITH YOUR REGULAR TOOTHPASTE   [place]- Preparing For Surgery  Before surgery, you can play an important role. Because skin is not sterile, your skin needs to be as free of germs as possible. You can reduce the number of germs on your skin by washing with CHG (chlorahexidine gluconate) Soap before surgery.  CHG is an antiseptic cleaner which kills germs and bonds with the skin to continue killing germs even after washing.     Please do not use if you have an allergy to CHG or antibacterial soaps. If your skin becomes reddened/irritated stop using the  CHG.  Do not shave (including legs and underarms) for at least 48 hours prior to first CHG shower. It is OK to shave your face.  Please follow these instructions carefully.     Shower the NIGHT BEFORE SURGERY and the MORNING OF SURGERY with CHG Soap.   If you chose to wash your hair, wash your hair first as usual with your normal shampoo. After you shampoo, rinse your hair and body thoroughly to remove the shampoo.  Then [employer] and genitals (private parts) with your normal soap and rinse thoroughly to remove soap.  After that  Use CHG Soap as you would any other liquid soap. You can apply CHG directly to the skin and wash gently with a scrungie or a clean washcloth.   Apply the CHG Soap to your body ONLY FROM THE NECK DOWN.  Do not use on open wounds or open sores. Avoid contact with your eyes, ears, mouth and genitals (private parts). Wash Face and genitals (private parts)  with your normal soap.   Wash thoroughly, paying special attention to the area where your surgery will be performed.  Thoroughly rinse your body with warm water from the neck down.  DO NOT shower/wash with your normal soap after using and rinsing off the CHG Soap.  Pat yourself dry with a CLEAN TOWEL.  Wear CLEAN PAJAMAS to bed the night before surgery  Place CLEAN SHEETS on your bed the night before your surgery  DO NOT SLEEP WITH PETS.   Day of Surgery:  Take a shower with CHG soap. Wear Clean/Comfortable clothing the morning of surgery Do not apply any deodorants/lotions.   Remember to brush your teeth WITH YOUR REGULAR TOOTHPASTE.  Please read over the following fact sheets that you were given.

## 2022-03-21 ENCOUNTER — Ambulatory Visit: Payer: Medicare HMO | Admitting: Physical Therapy

## 2022-03-21 NOTE — H&P (Signed)
[facility]uma [facility] (OTS) H&P  Patient ID: MELROSE KEARSE MRN: [id number] DOB/AGE: 24-Jan-1946 76 y.o.  Reason for Surgery: Left subtrochanteric femur fracture nonunion  HPI: Tina Bowman is an 76 y.o. female with multiple medical problems including Parkinson's, early dementia, sleep apnea, hypertension, fibromyalgia, COPD/asthma, and coronary artery disease presenting for repair of left femur nonunion. Patient unfortunately sustained a fall in August 2022, which resulted in a left intertrochanteric/subtrochanteric femur fracture. She underwent intramedullary nail of the fracture by Dr. Joice Lofts on 06/11/2021. Patient did well initially but over the last 9 month has unfortunately go on to nonunion at the fracture site. Patient was referred to OTS for evaluation by an orthopedic traumatologist. She continues to have pain in the left hip which has limited her ability to ambulate. She presents now for repair of the nonunion.  Prior to initial injury, patient was ambulating with a rolling walker. She is no longer able to stand or bear weight.  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   Sleep apnea    Slurred speech    foggy minded    Past Surgical History:  Procedure Laterality Date   ABDOMINAL HYSTERECTOMY     APPENDECTOMY     BACK SURGERY     BREAST EXCISIONAL BIOPSY Left    2000's   BREAST SURGERY     CATARACT EXTRACTION     Also, retina surgery   CHOLECYSTECTOMY     COLONOSCOPY N/A 05/11/2021   Procedure: COLONOSCOPY;  Surgeon: Toledo, Boykin Nearing, MD;  Location: [facility] ENDOSCOPY;  Service: Gastroenterology;   Laterality: N/A;  PARKINSON'S   EYE SURGERY     FRACTURE SURGERY     INTRAMEDULLARY (IM) NAIL INTERTROCHANTERIC Left 06/11/2021   Procedure: INTRAMEDULLARY (IM) NAIL INTERTROCHANTRIC;  Surgeon: Christena Flake, MD;  Location: [facility] ORS;  Service: Orthopedics;  Laterality: Left;   NARCOTIC PAIN PUMP IMPLANT     SHOULDER SURGERY     TONSILLECTOMY     TUBAL LIGATION      Family History  Problem Relation Age of Onset   Heart attack Mother    Heart disease Father    Breast cancer Cousin    Breast cancer Maternal Aunt    Diabetes Maternal Grandfather     Social History:  reports that she has never smoked. She has never used smokeless tobacco. She reports that she does not currently use alcohol. She reports that she does not use drugs.  Allergies:  Allergies  Allergen Reactions   Methadone Other (See Comments)    Pt doesn't know   Niacin And Related Other (See Comments)    Gives her hot flashes   Oxycontin [Oxycodone] Other (See Comments)    Makes her feel strange.   Sulfa Antibiotics Other (See Comments)    Pt doesn't know   Other Rash    Band aids (different brands) causes pt to break out in a rash.     Medications: I have reviewed the patient's current medications. Prior to Admission:  No medications prior to admission.    ROS: Constitutional: No fever or chills  Vision: No changes in vision ENT: No difficulty swallowing CV: No chest pain Pulm: No SOB or wheezing GI: No nausea or vomiting GU: No urgency or inability to hold urine Skin: No poor wound healing Neurologic: No numbness or tingling Psychiatric: No depression or anxiety Heme: No bruising Allergic: No reaction to medications or food   Exam: There were no vitals taken for this visit. General:NAD Orientation:Alert and oriented Mood and Affect: Minimally conversant Gait: Unable to stand or bear weight Coordination and balance: Poor balance  LLE: Healed surgical incisions. Discomfort with hip motion. Full  knee motion. Endorses sensation throughout extremity. Unable to stand or bear weight. Neurovascularly intact  RLE: Skin without lesions. No tenderness to palpation. Full painless ROM, full strength in each muscle groups without evidence of instability.   Medical Decision Making: Data: Imaging: AP pelvis with lateral view of the left hip shows IMN in place. The nail is broken through the proximal interlocking hole. The fracture is not fully united  Labs:  Results for orders placed or performed during the hospital encounter of 03/17/22 (from the past 168 hour(s))  Basic metabolic panel per protocol   Collection Time: 03/17/22 10:33 AM  Result Value Ref Range   Sodium 139 135 - 145 mmol/L   Potassium 3.1 (L) 3.5 - 5.1 mmol/L   Chloride 103 98 - 111 mmol/L   CO2 26 22 - 32 mmol/L   Glucose, Bld 101 (H) 70 - 99 mg/dL   BUN 15 8 - 23 mg/dL   Creatinine, Ser 6.29 0.44 - 1.00 mg/dL   Calcium 9.5 8.9 - 47.6 mg/dL   GFR, Estimated >54 >65 mL/min   Anion gap 10 5 - 15  CBC per protocol   Collection Time: 03/17/22 10:33 AM  Result Value Ref Range   WBC 5.9 4.0 - 10.5 K/uL   RBC 4.27 3.87 - 5.11 MIL/uL   Hemoglobin 13.5 12.0 - 15.0 g/dL   HCT 03.5 46.5 - 68.1 %   MCV 96.0 80.0 - 100.0 fL   MCH 31.6 26.0 - 34.0 pg   MCHC 32.9 30.0 - 36.0 g/dL   RDW 27.5 17.0 - 01.7 %   Platelets 205 150 - 400 K/uL   nRBC 0.0 0.0 - 0.2 %    Assessment/Plan: 76 year old female s/p IMN left subtrochanteric femur fracture 06/11/2021 with subsequent nonunion  Patient with symptomatic nonunion. Hardware has broken/failed, as seen on imaging. I would recommend proceeding with removal of current hardware and revision of fixation. Risks and benefits have been discussed at length with the patient and her husband. They both agree to proceed with surgery. Consent will be obtained. We will plan to admit the patient post-operatively for pain control and therapies.    Thompson Caul PA-C [facility] [phone] (office) [email or web address]

## 2022-03-22 ENCOUNTER — Inpatient Hospital Stay (HOSPITAL_COMMUNITY): Payer: Medicare HMO | Admitting: Anesthesiology

## 2022-03-22 ENCOUNTER — Encounter (HOSPITAL_COMMUNITY)
Admission: RE | Disposition: A | Discharge status: Skilled Nursing Facility | Payer: Self-pay | Source: Home / Self Care | Attending: Student

## 2022-03-22 ENCOUNTER — Inpatient Hospital Stay (HOSPITAL_COMMUNITY): Payer: Medicare HMO

## 2022-03-22 ENCOUNTER — Other Ambulatory Visit: Payer: Self-pay

## 2022-03-22 ENCOUNTER — Encounter (HOSPITAL_COMMUNITY): Payer: Self-pay | Admitting: Student

## 2022-03-22 ENCOUNTER — Inpatient Hospital Stay (HOSPITAL_COMMUNITY)
Admission: RE | Admit: 2022-03-22 | Discharge: 2022-03-28 | DRG: 481 | Disposition: A | Discharge status: Skilled Nursing Facility | Payer: Medicare HMO | Attending: Student | Admitting: Student

## 2022-03-22 DIAGNOSIS — I2510 Atherosclerotic heart disease of native coronary artery without angina pectoris: Secondary | ICD-10-CM | POA: Diagnosis present

## 2022-03-22 DIAGNOSIS — I10 Essential (primary) hypertension: Secondary | ICD-10-CM | POA: Diagnosis present

## 2022-03-22 DIAGNOSIS — Z9109 Other allergy status, other than to drugs and biological substances: Secondary | ICD-10-CM | POA: Diagnosis not present

## 2022-03-22 DIAGNOSIS — M797 Fibromyalgia: Secondary | ICD-10-CM | POA: Diagnosis present

## 2022-03-22 DIAGNOSIS — G231 Progressive supranuclear ophthalmoplegia [Steele-Richardson-Olszewski]: Secondary | ICD-10-CM | POA: Diagnosis present

## 2022-03-22 DIAGNOSIS — M1990 Unspecified osteoarthritis, unspecified site: Secondary | ICD-10-CM | POA: Diagnosis present

## 2022-03-22 DIAGNOSIS — Z90710 Acquired absence of both cervix and uterus: Secondary | ICD-10-CM

## 2022-03-22 DIAGNOSIS — Z885 Allergy status to narcotic agent status: Secondary | ICD-10-CM

## 2022-03-22 DIAGNOSIS — Z888 Allergy status to other drugs, medicaments and biological substances status: Secondary | ICD-10-CM

## 2022-03-22 DIAGNOSIS — K219 Gastro-esophageal reflux disease without esophagitis: Secondary | ICD-10-CM | POA: Diagnosis present

## 2022-03-22 DIAGNOSIS — W19XXXD Unspecified fall, subsequent encounter: Secondary | ICD-10-CM | POA: Diagnosis present

## 2022-03-22 DIAGNOSIS — T84195A Other mechanical complication of internal fixation device of left femur, initial encounter: Secondary | ICD-10-CM

## 2022-03-22 DIAGNOSIS — Z9049 Acquired absence of other specified parts of digestive tract: Secondary | ICD-10-CM | POA: Diagnosis not present

## 2022-03-22 DIAGNOSIS — J449 Chronic obstructive pulmonary disease, unspecified: Secondary | ICD-10-CM | POA: Diagnosis present

## 2022-03-22 DIAGNOSIS — G4730 Sleep apnea, unspecified: Secondary | ICD-10-CM

## 2022-03-22 DIAGNOSIS — Z882 Allergy status to sulfonamides status: Secondary | ICD-10-CM | POA: Diagnosis not present

## 2022-03-22 DIAGNOSIS — E8889 Other specified metabolic disorders: Secondary | ICD-10-CM | POA: Diagnosis present

## 2022-03-22 DIAGNOSIS — S7222XK Displaced subtrochanteric fracture of left femur, subsequent encounter for closed fracture with nonunion: Principal | ICD-10-CM | POA: Diagnosis present

## 2022-03-22 DIAGNOSIS — G20 Parkinson's disease: Secondary | ICD-10-CM | POA: Diagnosis present

## 2022-03-22 DIAGNOSIS — Z9851 Tubal ligation status: Secondary | ICD-10-CM | POA: Diagnosis not present

## 2022-03-22 DIAGNOSIS — D62 Acute posthemorrhagic anemia: Secondary | ICD-10-CM | POA: Diagnosis not present

## 2022-03-22 HISTORY — PX: HARDWARE REMOVAL: SHX979

## 2022-03-22 HISTORY — PX: FEMUR IM NAIL: SHX1597

## 2022-03-22 LAB — CBC
HCT: 31.2 % — ABNORMAL LOW (ref 36.0–46.0)
Hemoglobin: 10.5 g/dL — ABNORMAL LOW (ref 12.0–15.0)
MCH: 32 pg (ref 26.0–34.0)
MCHC: 33.7 g/dL (ref 30.0–36.0)
MCV: 95.1 fL (ref 80.0–100.0)
Platelets: 158 10*3/uL (ref 150–400)
RBC: 3.28 MIL/uL — ABNORMAL LOW (ref 3.87–5.11)
RDW: 12.8 % (ref 11.5–15.5)
WBC: 11 10*3/uL — ABNORMAL HIGH (ref 4.0–10.5)
nRBC: 0 % (ref 0.0–0.2)

## 2022-03-22 LAB — CREATININE, SERUM
Creatinine, Ser: 0.52 mg/dL (ref 0.44–1.00)
GFR, Estimated: >60 mL/min (ref >60)

## 2022-03-22 SURGERY — INSERTION, INTRAMEDULLARY ROD, FEMUR
Anesthesia: General | Laterality: Left

## 2022-03-22 MED ORDER — CEFAZOLIN SODIUM-DEXTROSE 2-4 GM/100ML-% IV SOLN
INTRAVENOUS | Status: AC
  Administered 2022-03-22: 2 g via INTRAVENOUS
  Filled 2022-03-22: qty 100

## 2022-03-22 MED ORDER — HYDRALAZINE HCL 10 MG PO TABS: 10.0000 mg | ORAL_TABLET | Freq: Four times a day (QID) | ORAL | Status: DC | PRN

## 2022-03-22 MED ORDER — PROPOFOL 10 MG/ML IV BOLUS
INTRAVENOUS | Status: DC | PRN
  Administered 2022-03-22: 100 mg via INTRAVENOUS

## 2022-03-22 MED ORDER — DOCUSATE SODIUM 100 MG PO CAPS
100.0000 mg | ORAL_CAPSULE | Freq: Two times a day (BID) | ORAL | Status: DC
Start: 2022-03-22 — End: 2022-03-28
  Administered 2022-03-22 – 2022-03-28 (×13): 100 mg via ORAL
  Filled 2022-03-22 (×13): qty 1

## 2022-03-22 MED ORDER — MORPHINE SULFATE (PF) 2 MG/ML IV SOLN
0.5000 mg | INTRAVENOUS | Status: DC | PRN
  Administered 2022-03-22 – 2022-03-26 (×3): 1 mg via INTRAVENOUS
  Filled 2022-03-22 (×3): qty 1

## 2022-03-22 MED ORDER — CARBIDOPA-LEVODOPA 25-100 MG PO TABS
2.0000 | ORAL_TABLET | Freq: Three times a day (TID) | ORAL | Status: DC
  Administered 2022-03-22 – 2022-03-28 (×18): 2 via ORAL
  Filled 2022-03-22 (×18): qty 2

## 2022-03-22 MED ORDER — FENTANYL CITRATE (PF) 250 MCG/5ML IJ SOLN
INTRAMUSCULAR | Status: AC
  Filled 2022-03-22: qty 5

## 2022-03-22 MED ORDER — PHENYLEPHRINE 80 MCG/ML (10ML) SYRINGE FOR IV PUSH (FOR BLOOD PRESSURE SUPPORT)
PREFILLED_SYRINGE | INTRAVENOUS | Status: DC | PRN
  Administered 2022-03-22: 80 ug via INTRAVENOUS
  Administered 2022-03-22 (×3): 160 ug via INTRAVENOUS
  Administered 2022-03-22: 80 ug via INTRAVENOUS

## 2022-03-22 MED ORDER — 0.9 % SODIUM CHLORIDE (POUR BTL) OPTIME
TOPICAL | Status: DC | PRN
  Administered 2022-03-22: 1000 mL

## 2022-03-22 MED ORDER — ENOXAPARIN SODIUM 40 MG/0.4ML IJ SOSY
40.0000 mg | PREFILLED_SYRINGE | INTRAMUSCULAR | Status: DC
  Administered 2022-03-23 – 2022-03-28 (×6): 40 mg via SUBCUTANEOUS
  Filled 2022-03-22 (×6): qty 0.4

## 2022-03-22 MED ORDER — ONDANSETRON HCL 4 MG/2ML IJ SOLN
INTRAMUSCULAR | Status: AC
  Filled 2022-03-22: qty 2

## 2022-03-22 MED ORDER — DILTIAZEM HCL ER COATED BEADS 120 MG PO CP24
120.0000 mg | ORAL_CAPSULE | Freq: Every morning | ORAL | Status: DC
  Administered 2022-03-23 – 2022-03-28 (×6): 120 mg via ORAL
  Filled 2022-03-22 (×6): qty 1

## 2022-03-22 MED ORDER — FENTANYL CITRATE (PF) 100 MCG/2ML IJ SOLN
25.0000 ug | INTRAMUSCULAR | Status: DC | PRN
  Administered 2022-03-22 (×2): 25 ug via INTRAVENOUS

## 2022-03-22 MED ORDER — FENTANYL CITRATE (PF) 250 MCG/5ML IJ SOLN
INTRAMUSCULAR | Status: DC | PRN
Start: 2022-03-22 — End: 2022-03-22
  Administered 2022-03-22 (×2): 50 ug via INTRAVENOUS

## 2022-03-22 MED ORDER — CHLORHEXIDINE GLUCONATE 0.12 % MT SOLN: 15.0000 mL | Freq: Once | OROMUCOSAL | Status: AC

## 2022-03-22 MED ORDER — HYDROCODONE-ACETAMINOPHEN 7.5-325 MG PO TABS: 1.0000 | ORAL_TABLET | ORAL | Status: DC | PRN

## 2022-03-22 MED ORDER — METOCLOPRAMIDE HCL 5 MG/ML IJ SOLN: 5.0000 mg | Freq: Three times a day (TID) | INTRAMUSCULAR | Status: DC | PRN

## 2022-03-22 MED ORDER — FENTANYL CITRATE (PF) 100 MCG/2ML IJ SOLN
INTRAMUSCULAR | Status: AC
  Filled 2022-03-22: qty 2

## 2022-03-22 MED ORDER — MONTELUKAST SODIUM 10 MG PO TABS
10.0000 mg | ORAL_TABLET | Freq: Every day | ORAL | Status: DC
  Administered 2022-03-22 – 2022-03-27 (×6): 10 mg via ORAL
  Filled 2022-03-22 (×6): qty 1

## 2022-03-22 MED ORDER — CEFAZOLIN SODIUM-DEXTROSE 2-4 GM/100ML-% IV SOLN
2.0000 g | INTRAVENOUS | Status: AC
  Administered 2022-03-22: 2 g via INTRAVENOUS

## 2022-03-22 MED ORDER — CHLORHEXIDINE GLUCONATE 0.12 % MT SOLN
OROMUCOSAL | Status: AC
  Administered 2022-03-22: 15 mL via OROMUCOSAL
  Filled 2022-03-22: qty 15

## 2022-03-22 MED ORDER — CEFAZOLIN SODIUM-DEXTROSE 2-4 GM/100ML-% IV SOLN
2.0000 g | Freq: Three times a day (TID) | INTRAVENOUS | Status: AC
  Administered 2022-03-22 – 2022-03-23 (×2): 2 g via INTRAVENOUS
  Filled 2022-03-22 (×3): qty 100

## 2022-03-22 MED ORDER — OXYCODONE HCL 5 MG/5ML PO SOLN: 5.0000 mg | Freq: Once | ORAL | Status: DC | PRN

## 2022-03-22 MED ORDER — UMECLIDINIUM-VILANTEROL 62.5-25 MCG/ACT IN AEPB
1.0000 | INHALATION_SPRAY | Freq: Every day | RESPIRATORY_TRACT | Status: DC | PRN
Start: 2022-03-22 — End: 2022-03-28

## 2022-03-22 MED ORDER — ROCURONIUM BROMIDE 10 MG/ML (PF) SYRINGE
PREFILLED_SYRINGE | INTRAVENOUS | Status: AC
  Filled 2022-03-22: qty 10

## 2022-03-22 MED ORDER — ONDANSETRON HCL 4 MG/2ML IJ SOLN
INTRAMUSCULAR | Status: DC | PRN
  Administered 2022-03-22: 4 mg via INTRAVENOUS

## 2022-03-22 MED ORDER — ACETAMINOPHEN 500 MG PO TABS: 1000.0000 mg | ORAL_TABLET | Freq: Once | ORAL | Status: AC

## 2022-03-22 MED ORDER — ALBUTEROL SULFATE (2.5 MG/3ML) 0.083% IN NEBU: 3.0000 mL | INHALATION_SOLUTION | Freq: Four times a day (QID) | RESPIRATORY_TRACT | Status: DC | PRN

## 2022-03-22 MED ORDER — MIDAZOLAM HCL 2 MG/2ML IJ SOLN
INTRAMUSCULAR | Status: AC
  Filled 2022-03-22: qty 2

## 2022-03-22 MED ORDER — DARIFENACIN HYDROBROMIDE ER 7.5 MG PO TB24
7.5000 mg | ORAL_TABLET | Freq: Every day | ORAL | Status: DC
  Administered 2022-03-22 – 2022-03-28 (×7): 7.5 mg via ORAL
  Filled 2022-03-22 (×7): qty 1

## 2022-03-22 MED ORDER — LIDOCAINE 2% (20 MG/ML) 5 ML SYRINGE
INTRAMUSCULAR | Status: AC
Start: 2022-03-22 — End: ?
  Filled 2022-03-22: qty 5

## 2022-03-22 MED ORDER — ONDANSETRON HCL 4 MG/2ML IJ SOLN: 4.0000 mg | Freq: Once | INTRAMUSCULAR | Status: DC | PRN

## 2022-03-22 MED ORDER — SUGAMMADEX SODIUM 200 MG/2ML IV SOLN
INTRAVENOUS | Status: DC | PRN
  Administered 2022-03-22: 160 mg via INTRAVENOUS

## 2022-03-22 MED ORDER — TRAMADOL HCL 50 MG PO TABS
50.0000 mg | ORAL_TABLET | Freq: Four times a day (QID) | ORAL | Status: DC
  Administered 2022-03-22 – 2022-03-27 (×18): 50 mg via ORAL
  Filled 2022-03-22 (×22): qty 1

## 2022-03-22 MED ORDER — LACTATED RINGERS IV SOLN: INTRAVENOUS | Status: DC

## 2022-03-22 MED ORDER — POLYETHYLENE GLYCOL 3350 17 G PO PACK
17.0000 g | PACK | Freq: Every day | ORAL | Status: DC | PRN
  Administered 2022-03-28: 17 g via ORAL
  Filled 2022-03-22: qty 1

## 2022-03-22 MED ORDER — VANCOMYCIN HCL 1000 MG IV SOLR
INTRAVENOUS | Status: AC
Start: 2022-03-22 — End: ?
  Filled 2022-03-22: qty 20

## 2022-03-22 MED ORDER — OXYCODONE HCL 5 MG PO TABS: 5.0000 mg | ORAL_TABLET | Freq: Once | ORAL | Status: DC | PRN

## 2022-03-22 MED ORDER — HYDROCODONE-ACETAMINOPHEN 5-325 MG PO TABS
1.0000 | ORAL_TABLET | ORAL | Status: DC | PRN
  Administered 2022-03-22 – 2022-03-23 (×2): 1 via ORAL
  Filled 2022-03-22 (×2): qty 1

## 2022-03-22 MED ORDER — PHENYLEPHRINE HCL-NACL 20-0.9 MG/250ML-% IV SOLN
INTRAVENOUS | Status: DC | PRN
  Administered 2022-03-22: 20 ug/min via INTRAVENOUS

## 2022-03-22 MED ORDER — SODIUM CHLORIDE 0.9 % IV SOLN: INTRAVENOUS | Status: DC

## 2022-03-22 MED ORDER — PROPOFOL 10 MG/ML IV BOLUS
INTRAVENOUS | Status: AC
  Filled 2022-03-22: qty 20

## 2022-03-22 MED ORDER — ORAL CARE MOUTH RINSE: 15.0000 mL | Freq: Once | OROMUCOSAL | Status: AC

## 2022-03-22 MED ORDER — HYDROCHLOROTHIAZIDE 12.5 MG PO TABS
12.5000 mg | ORAL_TABLET | Freq: Every day | ORAL | Status: DC
  Administered 2022-03-22 – 2022-03-28 (×7): 12.5 mg via ORAL
  Filled 2022-03-22 (×7): qty 1

## 2022-03-22 MED ORDER — ACETAMINOPHEN 325 MG PO TABS
325.0000 mg | ORAL_TABLET | Freq: Four times a day (QID) | ORAL | Status: DC | PRN
  Administered 2022-03-25: 650 mg via ORAL
  Filled 2022-03-22: qty 1
  Filled 2022-03-22: qty 2

## 2022-03-22 MED ORDER — METOCLOPRAMIDE HCL 5 MG PO TABS: 5.0000 mg | ORAL_TABLET | Freq: Three times a day (TID) | ORAL | Status: DC | PRN

## 2022-03-22 MED ORDER — ONDANSETRON HCL 4 MG/2ML IJ SOLN: 4.0000 mg | Freq: Four times a day (QID) | INTRAMUSCULAR | Status: DC | PRN

## 2022-03-22 MED ORDER — TRANEXAMIC ACID-NACL 1000-0.7 MG/100ML-% IV SOLN
1000.0000 mg | Freq: Once | INTRAVENOUS | Status: AC
  Administered 2022-03-22: 1000 mg via INTRAVENOUS
  Filled 2022-03-22: qty 100

## 2022-03-22 MED ORDER — PANTOPRAZOLE SODIUM 40 MG PO TBEC
40.0000 mg | DELAYED_RELEASE_TABLET | Freq: Every day | ORAL | Status: DC
  Administered 2022-03-23 – 2022-03-28 (×6): 40 mg via ORAL
  Filled 2022-03-22 (×6): qty 1

## 2022-03-22 MED ORDER — LOSARTAN POTASSIUM 50 MG PO TABS
50.0000 mg | ORAL_TABLET | Freq: Every day | ORAL | Status: DC
  Administered 2022-03-22 – 2022-03-28 (×7): 50 mg via ORAL
  Filled 2022-03-22 (×7): qty 1

## 2022-03-22 MED ORDER — VANCOMYCIN HCL 1000 MG IV SOLR
INTRAVENOUS | Status: DC | PRN
  Administered 2022-03-22: 1000 mg

## 2022-03-22 MED ORDER — ACETAMINOPHEN 500 MG PO TABS
ORAL_TABLET | ORAL | Status: AC
  Administered 2022-03-22: 1000 mg via ORAL
  Filled 2022-03-22: qty 2

## 2022-03-22 MED ORDER — DEXAMETHASONE SODIUM PHOSPHATE 10 MG/ML IJ SOLN
INTRAMUSCULAR | Status: DC | PRN
  Administered 2022-03-22: 8 mg via INTRAVENOUS

## 2022-03-22 MED ORDER — ROCURONIUM BROMIDE 10 MG/ML (PF) SYRINGE
PREFILLED_SYRINGE | INTRAVENOUS | Status: DC | PRN
  Administered 2022-03-22 (×2): 10 mg via INTRAVENOUS
  Administered 2022-03-22: 70 mg via INTRAVENOUS

## 2022-03-22 MED ORDER — ONDANSETRON HCL 4 MG PO TABS: 4.0000 mg | ORAL_TABLET | Freq: Four times a day (QID) | ORAL | Status: DC | PRN

## 2022-03-22 MED ORDER — DEXAMETHASONE SODIUM PHOSPHATE 10 MG/ML IJ SOLN
INTRAMUSCULAR | Status: AC
  Filled 2022-03-22: qty 1

## 2022-03-22 MED ORDER — LIDOCAINE 2% (20 MG/ML) 5 ML SYRINGE
INTRAMUSCULAR | Status: DC | PRN
  Administered 2022-03-22: 60 mg via INTRAVENOUS

## 2022-03-22 SURGICAL SUPPLY — 78 items
BAG COUNTER SPONGE SURGICOUNT (BAG) ×2 IMPLANT
BIT DRILL INTERTAN LAG SCREW (BIT) ×1 IMPLANT
BIT DRILL SHORT 4.0 (BIT) IMPLANT
BLADE SURG 10 STRL SS (BLADE) ×4 IMPLANT
BNDG COHESIVE 4X5 TAN STRL (GAUZE/BANDAGES/DRESSINGS) ×2 IMPLANT
BNDG COHESIVE 6X5 TAN STRL LF (GAUZE/BANDAGES/DRESSINGS) ×2 IMPLANT
BNDG ELASTIC 4X5.8 VLCR STR LF (GAUZE/BANDAGES/DRESSINGS) ×2 IMPLANT
BNDG ELASTIC 6X5.8 VLCR STR LF (GAUZE/BANDAGES/DRESSINGS) ×2 IMPLANT
BRUSH SCRUB EZ PLAIN DRY (MISCELLANEOUS) ×4 IMPLANT
CHLORAPREP W/TINT 26 (MISCELLANEOUS) ×2 IMPLANT
COVER SURGICAL LIGHT HANDLE (MISCELLANEOUS) ×4 IMPLANT
DERMABOND ADVANCED (GAUZE/BANDAGES/DRESSINGS) ×1
DERMABOND ADVANCED .7 DNX12 (GAUZE/BANDAGES/DRESSINGS) IMPLANT
DRAPE C-ARM 35X43 STRL (DRAPES) ×2 IMPLANT
DRAPE C-ARM 42X72 X-RAY (DRAPES) IMPLANT
DRAPE C-ARMOR (DRAPES) ×2 IMPLANT
DRAPE HALF SHEET 40X57 (DRAPES) ×4 IMPLANT
DRAPE IMP U-DRAPE 54X76 (DRAPES) ×4 IMPLANT
DRAPE INCISE IOBAN 66X45 STRL (DRAPES) ×2 IMPLANT
DRAPE ORTHO SPLIT 77X108 STRL (DRAPES) ×2
DRAPE SURG 17X23 STRL (DRAPES) ×2 IMPLANT
DRAPE SURG ORHT 6 SPLT 77X108 (DRAPES) ×2 IMPLANT
DRAPE U-SHAPE 47X51 STRL (DRAPES) ×1 IMPLANT
DRESSING MEPILEX FLEX 4X4 (GAUZE/BANDAGES/DRESSINGS) IMPLANT
DRILL BIT SHORT 4.0 (BIT) ×3
DRSG MEPILEX BORDER 4X8 (GAUZE/BANDAGES/DRESSINGS) ×1 IMPLANT
DRSG MEPILEX FLEX 4X4 (GAUZE/BANDAGES/DRESSINGS) ×6
ELECT REM PT RETURN 9FT ADLT (ELECTROSURGICAL) ×2
ELECTRODE REM PT RTRN 9FT ADLT (ELECTROSURGICAL) ×1 IMPLANT
GAUZE SPONGE 4X4 12PLY STRL (GAUZE/BANDAGES/DRESSINGS) ×2 IMPLANT
GLOVE BIO SURGEON STRL SZ 6.5 (GLOVE) ×6 IMPLANT
GLOVE BIO SURGEON STRL SZ7.5 (GLOVE) ×8 IMPLANT
GLOVE BIOGEL PI IND STRL 6.5 (GLOVE) ×1 IMPLANT
GLOVE BIOGEL PI IND STRL 7.5 (GLOVE) ×1 IMPLANT
GLOVE BIOGEL PI INDICATOR 6.5 (GLOVE) ×1
GLOVE BIOGEL PI INDICATOR 7.5 (GLOVE) ×1
GOWN STRL REUS W/ TWL LRG LVL3 (GOWN DISPOSABLE) ×3 IMPLANT
GOWN STRL REUS W/ TWL XL LVL3 (GOWN DISPOSABLE) ×1 IMPLANT
GOWN STRL REUS W/TWL LRG LVL3 (GOWN DISPOSABLE) ×3
GOWN STRL REUS W/TWL XL LVL3 (GOWN DISPOSABLE) ×1
GUIDE PIN 3.2X343 (PIN) ×1
GUIDE PIN 3.2X343MM (PIN) ×1
GUIDE ROD 3.0 (MISCELLANEOUS) ×2
KIT BASIN OR (CUSTOM PROCEDURE TRAY) ×2 IMPLANT
KIT TURNOVER KIT B (KITS) ×2 IMPLANT
MANIFOLD NEPTUNE II (INSTRUMENTS) ×2 IMPLANT
NAIL LOCK CANN 10X340 130D LT (Nail) ×1 IMPLANT
NEEDLE 22X1 1/2 (OR ONLY) (NEEDLE) IMPLANT
NS IRRIG 1000ML POUR BTL (IV SOLUTION) ×2 IMPLANT
PACK GENERAL/GYN (CUSTOM PROCEDURE TRAY) ×2 IMPLANT
PACK ORTHO EXTREMITY (CUSTOM PROCEDURE TRAY) ×2 IMPLANT
PAD ARMBOARD 7.5X6 YLW CONV (MISCELLANEOUS) ×4 IMPLANT
PIN GUIDE 3.2X343MM (PIN) IMPLANT
REAMER ROD DEEP FLUTE 2.5X950 (INSTRUMENTS) ×3 IMPLANT
ROD GUIDE 3.0 (MISCELLANEOUS) IMPLANT
SCREW LAG COMPR KIT 80/75 (Screw) ×1 IMPLANT
SCREW LAG COMPR KIT 90/85 (Screw) ×1 IMPLANT
SCREW TRIGEN LOW PROF 5.0X37.5 (Screw) ×1 IMPLANT
SCREW TRIGEN LOW PROF 5.0X45 (Screw) ×1 IMPLANT
SPONGE T-LAP 18X18 ~~LOC~~+RFID (SPONGE) ×2 IMPLANT
STAPLER VISISTAT 35W (STAPLE) ×2 IMPLANT
STOCKINETTE IMPERVIOUS LG (DRAPES) ×2 IMPLANT
SUCTION FRAZIER HANDLE 10FR (MISCELLANEOUS)
SUCTION TUBE FRAZIER 10FR DISP (MISCELLANEOUS) IMPLANT
SUT ETHILON 3 0 PS 1 (SUTURE) ×2 IMPLANT
SUT MNCRL AB 3-0 PS2 18 (SUTURE) ×2 IMPLANT
SUT MON AB 2-0 CT1 36 (SUTURE) ×2 IMPLANT
SUT PDS AB 2-0 CT1 27 (SUTURE) IMPLANT
SUT VIC AB 0 CT1 27 (SUTURE)
SUT VIC AB 0 CT1 27XBRD ANBCTR (SUTURE) IMPLANT
SUT VIC AB 2-0 CT1 27 (SUTURE) ×2
SUT VIC AB 2-0 CT1 TAPERPNT 27 (SUTURE) ×2 IMPLANT
SYR CONTROL 10ML LL (SYRINGE) IMPLANT
TOWEL GREEN STERILE (TOWEL DISPOSABLE) ×4 IMPLANT
TOWEL GREEN STERILE FF (TOWEL DISPOSABLE) ×4 IMPLANT
TUBE CONNECTING 12X1/4 (SUCTIONS) ×2 IMPLANT
UNDERPAD 30X36 HEAVY ABSORB (UNDERPADS AND DIAPERS) ×2 IMPLANT
YANKAUER SUCT BULB TIP NO VENT (SUCTIONS) ×2 IMPLANT

## 2022-03-22 NOTE — Discharge Instructions (Addendum)
Orthopaedic Trauma Service Discharge Instructions   General Discharge Instructions  WEIGHT BEARING STATUS:Non-weightbearing left lower extremity  RANGE OF MOTION/ACTIVITY: ok for hip range of motion as tolerated  Wound Care:You may remove your surgical dressings. Incisions can be left open to air if there is no drainage. If incision continues to have drainage, follow wound care instructions below. Okay to shower if no drainage from incisions.  DVT/PE prophylaxis: Aspirin 325 mg twice daily x 30 days  Diet: as you were eating previously.  Can use over the counter stool softeners and bowel preparations, such as Miralax, to help with bowel movements.  Narcotics can be constipating.  Be sure to drink plenty of fluids  PAIN MEDICATION USE AND EXPECTATIONS  You have likely been given narcotic medications to help control your pain.  After a traumatic event that results in an fracture (broken bone) with or without surgery, it is ok to use narcotic pain medications to help control one's pain.  We understand that everyone responds to pain differently and each individual patient will be evaluated on a regular basis for the continued need for narcotic medications. Ideally, narcotic medication use should last no more than 6-8 weeks (coinciding with fracture healing).   As a patient it is your responsibility as well to monitor narcotic medication use and report the amount and frequency you use these medications when you come to your office visit.   We would also advise that if you are using narcotic medications, you should take a dose prior to therapy to maximize you participation.  IF YOU ARE ON NARCOTIC MEDICATIONS IT IS NOT PERMISSIBLE TO OPERATE A MOTOR VEHICLE (MOTORCYCLE/CAR/TRUCK/MOPED) OR HEAVY MACHINERY DO NOT MIX NARCOTICS WITH OTHER CNS (CENTRAL NERVOUS SYSTEM) DEPRESSANTS SUCH AS ALCOHOL   STOP SMOKING OR USING NICOTINE PRODUCTS!!!!  As discussed nicotine severely impairs your body's  ability to heal surgical and traumatic wounds but also impairs bone healing.  Wounds and bone heal by forming microscopic blood vessels (angiogenesis) and nicotine is a vasoconstrictor (essentially, shrinks blood vessels).  Therefore, if vasoconstriction occurs to these microscopic blood vessels they essentially disappear and are unable to deliver necessary nutrients to the healing tissue.  This is one modifiable factor that you can do to dramatically increase your chances of healing your injury.    (This means no smoking, no nicotine gum, patches, etc)  DO NOT USE NONSTEROIDAL ANTI-INFLAMMATORY DRUGS (NSAID'S)  Using products such as Advil (ibuprofen), Aleve (naproxen), Motrin (ibuprofen) for additional pain control during fracture healing can delay and/or prevent the healing response.  If you would like to take over the counter (OTC) medication, Tylenol (acetaminophen) is ok.  However, some narcotic medications that are given for pain control contain acetaminophen as well. Therefore, you should not exceed more than 4000 mg of tylenol in a day if you do not have liver disease.  Also note that there are may OTC medicines, such as cold medicines and allergy medicines that my contain tylenol as well.  If you have any questions about medications and/or interactions please ask your doctor/PA or your pharmacist.      ICE AND ELEVATE INJURED/OPERATIVE EXTREMITY  Using ice and elevating the injured extremity above your heart can help with swelling and pain control.  Icing in a pulsatile fashion, such as 20 minutes on and 20 minutes off, can be followed.    Do not place ice directly on skin. Make sure there is a barrier between to skin and the ice pack.  Using frozen items such as frozen peas works well as the conform nicely to the are that needs to be iced.  USE AN ACE WRAP OR TED HOSE FOR SWELLING CONTROL  In addition to icing and elevation, Ace wraps or TED hose are used to help limit and resolve swelling.   It is recommended to use Ace wraps or TED hose until you are informed to stop.    When using Ace Wraps start the wrapping distally (farthest away from the body) and wrap proximally (closer to the body)   Example: If you had surgery on your leg or thing and you do not have a splint on, start the ace wrap at the toes and work your way up to the thigh        If you had surgery on your upper extremity and do not have a splint on, start the ace wrap at your fingers and work your way up to the upper arm  CALL THE OFFICE WITH ANY QUESTIONS OR CONCERNS: [phone]   VISIT OUR WEBSITE FOR ADDITIONAL INFORMATION: [email or web address]    Discharge Wound Care Instructions  Do NOT apply any ointments, solutions or lotions to pin sites or surgical wounds.  These prevent needed drainage and even though solutions like hydrogen peroxide kill bacteria, they also damage cells lining the pin sites that help fight infection.  Applying lotions or ointments can keep the wounds moist and can cause them to breakdown and open up as well. This can increase the risk for infection. When in doubt call the office.  If any drainage is noted, use foam dressing. - These dressing supplies should be available at local medical supply stores [facility], [facility], etc) as well as Insurance claims handler ([employer], [facility], [employer], etc)  Once the incision is completely dry and without drainage, it may be left open to air out.  Showering may begin 36-48 hours later.  Cleaning gently with soap and water.

## 2022-03-22 NOTE — Anesthesia Preprocedure Evaluation (Addendum)
Anesthesia Evaluation  Patient identified by MRN, date of birth, ID band Patient awake    Reviewed: Allergy & Precautions, NPO status , Patient's Chart, lab work & pertinent test results  Airway Mallampati: III  TM Distance: >3 FB Neck ROM: Full    Dental  (+) Dental Advisory Given, Poor Dentition   Pulmonary asthma , sleep apnea , COPD,  COPD inhaler,    Pulmonary exam normal breath sounds clear to auscultation       Cardiovascular hypertension, Pt. on medications Normal cardiovascular exam Rhythm:Regular Rate:Normal     Neuro/Psych PSYCHIATRIC DISORDERS Atypical parkinsonism negative neurological ROS     GI/Hepatic Neg liver ROS, GERD  Controlled,  Endo/Other  negative endocrine ROS  Renal/GU negative Renal ROS  negative genitourinary   Musculoskeletal  (+) Arthritis , Osteoarthritis,  Fibromyalgia -Has narcotic pain pump   Abdominal   Peds  Hematology negative hematology ROS (+) Hb 13.5, plt 205   Anesthesia Other Findings   Reproductive/Obstetrics                            Anesthesia Physical Anesthesia Plan  ASA: 3  Anesthesia Plan: General   Post-op Pain Management: Tylenol PO (pre-op)*   Induction: Intravenous  PONV Risk Score and Plan: 3 and Ondansetron, Dexamethasone and Treatment may vary due to age or medical condition  Airway Management Planned: Oral ETT  Additional Equipment: None  Intra-op Plan:   Post-operative Plan: Extubation in OR  Informed Consent: I have reviewed the patients History and Physical, chart, labs and discussed the procedure including the risks, benefits and alternatives for the proposed anesthesia with the patient or authorized representative who has indicated his/her understanding and acceptance.     Dental advisory given  Plan Discussed with: CRNA  Anesthesia Plan Comments: (Airway 05/2021 for hip IM nail: Ventilation: Mask ventilation  without difficulty Laryngoscope Size: McGraph and 3 Grade View: Grade I Tube type: Oral Tube size: 6.5 mm Number of attempts: 1)       Anesthesia Quick Evaluation

## 2022-03-22 NOTE — Evaluation (Signed)
Occupational Therapy Evaluation Patient Details Name: Tina Bowman MRN: [id number] DOB: 1946/05/11 Today's Date: 03/22/2022   History of Present Illness 76 y.o. female with multiple medical problems including Tina Bowman, early dementia, sleep apnea, hypertension, fibromyalgia, COPD/asthma, and coronary artery disease presented for repair of left femur nonunion.  S/p L leg ORIF with NWB to L leg.   Clinical Impression   Patient admitted for the procedure above.  PTA, from the chart, she lives with her spouse, who provides assist as needed for ADL, community mobility and IADL.  Chart suggests limited mobility due to painful L leg.  Patient is able to move her left leg quite well needing Mod A to sit on the edge of the bed.  Sit to stand was deferred, she is unable to maintain NWB to her L leg.  OT will follow in the acute setting.  SNF is being recommended for post acute rehab; but OT will defer to the spouse's wishes.  If the husband believes he can care for her at wheelchair level with Tina Bowman services, home could be possible.  The patient would probably need a hospital bed and hoyer lift until her weight bearing status is progressed.  Patient's deficits are listed below, but cognition and her ability to NWB are the primary.  She has a significant hx of falls and leaning backwards in standing.        Recommendations for follow up therapy are one component of a multi-disciplinary discharge planning process, led by the attending physician.  Recommendations may be updated based on patient status, additional functional criteria and insurance authorization.   Follow Up Recommendations  Skilled nursing-short term rehab (<3 hours/day) vs. HH with DME and 24 hour Max A.   Assistance Recommended at Discharge Frequent or constant Supervision/Assistance  Patient can return home with the following Two people to help with walking and/or transfers;Help with stairs or ramp for entrance;Assist for  transportation;Assistance with cooking/housework;Direct supervision/assist for financial management;Direct supervision/assist for medications management    Functional Status Assessment  Patient has had a recent decline in their functional status and demonstrates the ability to make significant improvements in function in a reasonable and predictable amount of time.  Equipment Recommendations  None recommended by OT    Recommendations for Other Services       Precautions / Restrictions Precautions Precautions: Fall Precaution Comments: leans back in sit Restrictions Weight Bearing Restrictions: Yes LLE Weight Bearing: Non weight bearing Other Position/Activity Restrictions: Tina Bowman hx of falls.      Mobility Bed Mobility Overal bed mobility: Needs Assistance Bed Mobility: Supine to Sit, Sit to Supine     Supine to sit: Mod assist Sit to supine: Mod assist   General bed mobility comments: assist with trunk to sit, and legs for supine    Transfers                   General transfer comment: deferred - unable to maintain NWB      Balance Overall balance assessment: Needs assistance Sitting-balance support: Feet supported Sitting balance-Tina Bowman: Poor   Postural control: Posterior lean                                 ADL either performed or assessed with clinical judgement   ADL Overall ADL's : Needs assistance/impaired     Grooming: Wash/dry hands;Wash/dry face;Min guard;Sitting   Upper Body Bathing: Minimal assistance;Sitting   Lower Body  Bathing: Maximal assistance;Bed level   Upper Body Dressing : Minimal assistance;Sitting   Lower Body Dressing: Maximal assistance;Sitting/lateral leans     Toilet Transfer Details (indicate cue type and reason): deferred.  Unable to maintain NWB                 Vision Patient Visual Report: No change from baseline;Diplopia;Blurring of vision       Perception  Perception Perception: Not tested   Praxis Praxis Praxis: Not tested    Pertinent Vitals/Pain Pain Assessment Pain Assessment: Faces Faces Pain Bowman: Hurts a little bit Pain Location: L hip/groin Pain Intervention(s): Monitored during session     Hand Dominance Right   Extremity/Trunk Assessment Upper Extremity Assessment Upper Extremity Assessment: Overall WFL for tasks assessed   Lower Extremity Assessment Lower Extremity Assessment: Defer to PT evaluation   Cervical / Trunk Assessment Cervical / Trunk Assessment: Kyphotic   Communication Communication Communication: Expressive difficulties;Other (comment) (Will answer yes to most questions)   Cognition Arousal/Alertness: Awake/alert Behavior During Therapy: Anxious Overall Cognitive Status: History of cognitive impairments - at baseline                                 General Comments: Does follow single step commands consistently.  Stated she was born in [id number].  Stated [facility] [employer] Bowman for place.     General Comments   VSS on RA    Exercises     Shoulder Instructions      Home Living Family/patient expects to be discharged to:: Private residence Living Arrangements: Spouse/significant other Available Help at Discharge: Family;Available 24 hours/day Type of Home: House Home Access: Ramped entrance                     Home Equipment: Rollator (4 wheels);Grab bars - tub/shower;Toilet riser;BSC/3in1;Transport chair;Wheelchair - manual   Additional Comments: Taken from prior notes, ? shower chair at home and WIS vs tub/shower      Prior Functioning/Environment Prior Level of Function : Needs assist             Mobility Comments: Chart suggests limited mobility due to L leg pain. ADLs Comments: Chart states patient continues to feed herself, spouse assist as needed for ADL.  Spouse assist with meds, community mobility and IADL.        OT Problem List: Decreased activity  tolerance;Impaired balance (sitting and/or standing);Decreased knowledge of precautions;Decreased knowledge of use of DME or AE;Decreased safety awareness;Decreased cognition;Pain      OT Treatment/Interventions: Self-care/ADL training;Therapeutic activities;Patient/family education;Balance training;DME and/or AE instruction    OT Goals(Current goals can be found in the care plan section) Acute Rehab OT Goals Patient Stated Goal: Did say "no" to SNF OT Goal Formulation: Patient unable to participate in goal setting Time For Goal Achievement: 04/05/22 Potential to Achieve Goals: Fair ADL Goals Pt Will Perform Grooming: with supervision;sitting Pt Will Perform Upper Body Dressing: with supervision;sitting Pt Will Transfer to Toilet: with mod assist;squat pivot transfer;stand pivot transfer;with transfer board;bedside commode  OT Frequency: Min 2X/week    Co-evaluation              AM-PAC OT "6 Clicks" Daily Activity     Outcome Measure Help from another person eating meals?: A Little Help from another person taking care of personal grooming?: A Little Help from another person toileting, which includes using toliet, bedpan, or urinal?: Total Help from another person bathing (including  washing, rinsing, drying)?: A Lot Help from another person to put on and taking off regular upper body clothing?: A Little Help from another person to put on and taking off regular lower body clothing?: A Lot 6 Click Score: 14   End of Session Nurse Communication: Mobility status  Activity Tolerance: Patient tolerated treatment well Patient left: in bed;with call bell/phone within reach;with bed alarm set  OT Visit Diagnosis: Pain;History of falling (Z91.81);Other symptoms and signs involving cognitive function Pain - Right/Left: Left Pain - part of body: Leg                Time: 9629-5284 OT Time Calculation (min): 21 min Charges:  OT General Charges $OT Visit: 1 Visit OT Evaluation $OT Eval  Moderate Complexity: 1 Mod  03/22/2022  RP, OTR/L  Acute Rehabilitation Services  Office:  [phone]   Suzanna Obey 03/22/2022, 4:40 PM

## 2022-03-22 NOTE — Anesthesia Procedure Notes (Signed)
Procedure Name: Intubation Date/Time: 03/22/2022 7:40 AM Performed by: Jodell Cipro, CRNA Pre-anesthesia Checklist: Patient identified, Emergency Drugs available, Suction available and Patient being monitored Patient Re-evaluated:Patient Re-evaluated prior to induction Oxygen Delivery Method: Circle System Utilized Preoxygenation: Pre-oxygenation with 100% oxygen Induction Type: IV induction Ventilation: Mask ventilation without difficulty Laryngoscope Size: 3 and Glidescope Grade View: Grade I Tube type: Oral Tube size: 6.5 mm Number of attempts: 1 Airway Equipment and Method: Oral airway, Rigid stylet and Video-laryngoscopy Placement Confirmation: ETT inserted through vocal cords under direct vision, positive ETCO2 and breath sounds checked- equal and bilateral Secured at: 20 cm Tube secured with: Tape Dental Injury: Teeth and Oropharynx as per pre-operative assessment

## 2022-03-22 NOTE — Plan of Care (Signed)

## 2022-03-22 NOTE — Progress Notes (Signed)
  Transition of Care [facility]) Screening Note   Patient Details  Name: Tina Bowman Date of Birth: 1945/11/12   Transition of Care [facility]) CM/SW Contact:    Epifanio Lesches, RN Phone Number: [phone] 03/22/2022, 2:40 PM  -S/p Repair of left subtrochanteric nonunion removal of previous hardware left femur, 5/31  Hx of  left intertrochanteric/subtrochanteric femur fracture, IMN 06/11/2021.  From home .PTA independent with ADL's. DME usage RW.  PT/OT evals  pending...  Transition of Care Department [facility]) has reviewed patient and will continue to monitor patient advancement through interdisciplinary progression rounds.  If new patient transition needs arise, please place a TOC consult.

## 2022-03-22 NOTE — Interval H&P Note (Signed)
History and Physical Interval Note:  03/22/2022 7:23 AM  Tina Bowman  has presented today for surgery, with the diagnosis of Left subtrochanteric nonunion.  The various methods of treatment have been discussed with the patient and family. After consideration of risks, benefits and other options for treatment, the patient has consented to  Procedure(s): REVISON FIXATION OF SUBTROCHANTERIC NONUNION (Left) HARDWARE REMOVAL OF FRACTURED HIP NAIL (Left) as a surgical intervention.  The patient's history has been reviewed, patient examined, no change in status, stable for surgery.  I have reviewed the patient's chart and labs.  Questions were answered to the patient's satisfaction.     Caryn Bee P Mariaguadalupe Fialkowski

## 2022-03-22 NOTE — Transfer of Care (Signed)
Immediate Anesthesia Transfer of Care Note  Patient: Tina Bowman  Procedure(s) Performed: REVISON FIXATION OF SUBTROCHANTERIC NONUNION (Left) HARDWARE REMOVAL OF FRACTURED HIP NAIL (Left)  Patient Location: PACU  Anesthesia Type:General  Level of Consciousness: drowsy and patient cooperative  Airway & Oxygen Therapy: Patient Spontanous Breathing and Patient connected to face mask oxygen  Post-op Assessment: Report given to RN, Post -op Vital signs reviewed and stable and Patient moving all extremities  Post vital signs: Reviewed and stable  Last Vitals:  Vitals Value Taken Time  BP 130/65 03/22/22 1009  Temp    Pulse 91 03/22/22 1015  Resp 12 03/22/22 1015  SpO2 100 % 03/22/22 1015  Vitals shown include unvalidated device data.  Last Pain:  Vitals:   03/22/22 0624  TempSrc:   PainSc: 5       Patients Stated Pain Goal: 3 ([id number])  Complications: No notable events documented.

## 2022-03-22 NOTE — Addendum Note (Signed)
Addendum  created 03/22/22 1147 by Jodell Cipro, CRNA   Flowsheet accepted

## 2022-03-22 NOTE — Op Note (Signed)
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 Item Serial No. Manufacturer Lot No. LRB No. Used Action  HIP FRA NAIL LAG SCREW 10.5X90 - [id number] Orthopedic Implant HIP FRA NAIL LAG SCREW 10.5X90  [employer](ORTH,TRAU,BIO,SG) [id number] Left 1 Explanted  NAIL HIP FRA AFFIX [id number] L - [id number] Nail NAIL HIP FRA AFFIX [id number] L  [employer](ORTH,TRAU,BIO,SG) [id number] Left 1 Explanted  SCREW BONE CORTICAL 5.0X36 - [id number] Screw SCREW BONE CORTICAL 5.0X36  [employer](ORTH,TRAU,BIO,SG) [id number] Left 1 Explanted  NAIL LOCK CANN 10X340 130D LT - [id number] Nail NAIL LOCK CANN 10X340 130D LT  [employer] [id number] Left 1 Implanted  SCREW LAG COMPR KIT 80/75 - [id number] Screw SCREW LAG COMPR KIT 80/75  [employer] [id number] Left 1 Implanted  SCREW TRIGEN LOW PROF 5.0X45 - [id number] Screw SCREW TRIGEN LOW PROF 5.0X45  [employer] [id number] Left 1 Implanted  SCREW TRIGEN LOW PROF 5.0X37.5 - [id number] Screw SCREW TRIGEN LOW PROF 5.0X37.5  [employer] [id number] Left 1 Implanted     Indications for Surgery: 76 year old female who underwent intramedullary nailing of a left subtrochanteric femur fracture in August 2022.  She subsequently developed a nonunion and failed  fixation.  She had persistent pain and inability to ambulate.  Due to the unstable nature of her injury I recommend proceeding with revision fixation with removal of nail with possible intramedullary nailing versus blade plate fixation.  Risk and benefits were discussed with the patient and her husband.  Risks included but not limited to bleeding, infection, malunion, persistent nonunion, hardware failure, hardware irritation, nerve or blood vessel injury, DVT, and the possibility of anesthetic complications.  They agreed to proceed with surgery and consent was obtained.  Operative Findings: 1.  Removal of previous broken cephalomedullary nail 2.  Revision cephalomedullary nail fixation with placement of [employer] InterTAN 10 x 340 mm nail with a 80 mm lag screw/75 mm compression screw  Procedure: The patient was identified in the preoperative holding area. Consent was confirmed with the patient and their family and all questions were answered. The operative extremity was marked after confirmation with the patient. she was then brought back to the operating room by our anesthesia colleagues.  She was carefully transferred over to radiolucent flat top table.  She was placed under general anesthetic.  A bump was placed under the operative hip.  The left lower extremity was then prepped and draped in usual sterile fashion.  A timeout was performed to verify the patient, the procedure, and the extremity.  Preoperative antibiotics were dosed.  The hip and knee were flexed over a triangle.  Fluoroscopic imaging showed the failed fixation in the varus nonunion of the subtrochanteric femur.  For started out by removing the previous nail that was in place.  Through a proximal incision I split the muscle and gluteal  fascia and then threaded a conical extraction bolt into the proximal portion of the nail and was was able to remove this without difficulty.  I then reopened the lateral incision for placement of a  cephalomedullary lag screw.  I then was able to extracted the lag screw without any difficulty.  Then I focused on extraction of the distal portion of the nail.  I first removed the distal interlocking screws.  I then passed a ball-tipped guidewire down the center of the canal of the rod.  I then used another ball-tipped guidewire and placed it backwards to wedge into the central canal and I back slapped the ball-tipped guidewire and I was able to extract the nail without any significant problems.  I then proceeded to manipulate the fracture and nonunion site to obtain better alignment.  I did not reopen the nonunion site as there was some healing that had occurred.  I percutaneously placed a drill bit for a interlocking screw just medial to the ball-tipped guidewire that I had replaced down the center of the canal through the proximal starting point.  I was pleased with the initial starting point but I felt that the path of the nail had gone to medial.  The drill bit allowed for realignment of the proximal segment and I proceeded to ring the proximal segment and the isthmus up to 11.5 mm.  Then passed a 16 mm reamer through the proximal segment to allow passage of the proximal nail diameter.  The nail was then passed down the center canal and alignment was maintained.  I was able to place the new nail where it was slightly superior to the previous lag screw.  I then used the targeting arm to direct a threaded guidewire into the head/neck segment.  I confirmed adequate tip apex distance.  I then drilled the path for the compression screw placed an antirotation bar.  I then drilled the path for the lag screw but unfortunately I felt that the guidewire had went down the same path of the previous lag screw so I removed the guidewire and drilled and decided to place a 80 mm lag screw.  The lag screw was placed and got excellent purchase.  I then placed a compression screw and was able to medialize the nail slight  amount.  Alignment was appropriate and the fracture remained reduced.  I did bend the drill bit that was blocking the path of the nail.  I attempted to remove the drill bit which I was successfully able to do however I tried to place a blocking screw to reinforce the fixation but due to the bent nature of the guidepin I was not able to find the path for the screw and I felt that the fixation was likely not to be any beneficial by placing the blocking screw.  As result I then placed a distal interlocking screw from lateral to medial.  Final fluoroscopic imaging was obtained.  The incisions were copiously irrigated.  A gram of vancomycin powder was placed into the incision.  Layered closure of 2-0 Vicryl, 3-0 Monocryl and Dermabond was used to close the skin.  Sterile dressings were applied.  The patient was then awoken from anesthesia and taken to the PACU in stable condition.  Post Op Plan/Instructions: The patient will be nonweightbearing to the left lower extremity.  She will receive postoperative Ancef.  She will receive Lovenox for DVT prophylaxis and discharged on aspirin 325 mg twice daily.  We will have her  mobilize with physical and occupational therapy.  I was present and performed the entire surgery.  Patrecia Pace, PA-C did assist me throughout the case. An assistant was necessary given the difficulty in approach, maintenance of reduction and ability to instrument the fracture.   Katha Hamming, MD [facility]

## 2022-03-22 NOTE — Anesthesia Postprocedure Evaluation (Signed)
Anesthesia Post Note  Patient: Tina Bowman  Procedure(s) Performed: REVISON FIXATION OF SUBTROCHANTERIC NONUNION (Left) HARDWARE REMOVAL OF FRACTURED HIP NAIL (Left)     Patient location during evaluation: PACU Anesthesia Type: General Level of consciousness: awake and alert, oriented and patient cooperative Pain management: pain level controlled Vital Signs Assessment: post-procedure vital signs reviewed and stable Respiratory status: spontaneous breathing, nonlabored ventilation and respiratory function stable Cardiovascular status: blood pressure returned to baseline and stable Postop Assessment: no apparent nausea or vomiting Anesthetic complications: no   No notable events documented.  Last Vitals:  Vitals:   03/22/22 0541 03/22/22 1009  BP: (!) 144/63 130/65  Pulse: 94 88  Resp: 16 15  Temp: 36.7 C 37.1 C  SpO2: 98% 100%    Last Pain:  Vitals:   03/22/22 0624  TempSrc:   PainSc: 5                  Lannie Fields

## 2022-03-23 ENCOUNTER — Encounter (HOSPITAL_COMMUNITY): Payer: Self-pay | Admitting: Student

## 2022-03-23 LAB — BASIC METABOLIC PANEL
Anion gap: 7 (ref 5–15)
BUN: 9 mg/dL (ref 8–23)
CO2: 24 mmol/L (ref 22–32)
Calcium: 8.6 mg/dL — ABNORMAL LOW (ref 8.9–10.3)
Chloride: 105 mmol/L (ref 98–111)
Creatinine, Ser: 0.63 mg/dL (ref 0.44–1.00)
GFR, Estimated: >60 mL/min (ref >60)
Glucose, Bld: 148 mg/dL — ABNORMAL HIGH (ref 70–99)
Potassium: 3.2 mmol/L — ABNORMAL LOW (ref 3.5–5.1)
Sodium: 136 mmol/L (ref 135–145)

## 2022-03-23 LAB — CBC
HCT: 27.2 % — ABNORMAL LOW (ref 36.0–46.0)
Hemoglobin: 9.3 g/dL — ABNORMAL LOW (ref 12.0–15.0)
MCH: 32.1 pg (ref 26.0–34.0)
MCHC: 34.2 g/dL (ref 30.0–36.0)
MCV: 93.8 fL (ref 80.0–100.0)
Platelets: 155 10*3/uL (ref 150–400)
RBC: 2.9 MIL/uL — ABNORMAL LOW (ref 3.87–5.11)
RDW: 12.7 % (ref 11.5–15.5)
WBC: 10.4 10*3/uL (ref 4.0–10.5)
nRBC: 0 % (ref 0.0–0.2)

## 2022-03-23 LAB — VITAMIN D 25 HYDROXY (VIT D DEFICIENCY, FRACTURES): Vit D, 25-Hydroxy: 35.18 ng/mL (ref 30–100)

## 2022-03-23 MED ORDER — HYDROCODONE-ACETAMINOPHEN 5-325 MG PO TABS
1.0000 | ORAL_TABLET | ORAL | Status: DC | PRN
  Administered 2022-03-24 – 2022-03-25 (×3): 1 via ORAL
  Administered 2022-03-26: 2 via ORAL
  Filled 2022-03-23 (×3): qty 1
  Filled 2022-03-23 (×2): qty 2

## 2022-03-23 MED ORDER — POTASSIUM CHLORIDE CRYS ER 20 MEQ PO TBCR
20.0000 meq | EXTENDED_RELEASE_TABLET | Freq: Two times a day (BID) | ORAL | Status: DC
  Administered 2022-03-23 (×2): 20 meq via ORAL
  Filled 2022-03-23 (×2): qty 1

## 2022-03-23 NOTE — Progress Notes (Signed)
Orthopaedic Trauma Progress Note  SUBJECTIVE: Doing okay this morning, trying to get comfortable in bed.  Notes her pain is currently controlled. Not very conversant at baseline. No chest pain. No SOB. No nausea/vomiting. No other complaints.   OBJECTIVE:  Vitals:   03/23/22 0535 03/23/22 0850  BP: (!) 124/58 (!) 171/86  Pulse: (!) 104 (!) 108  Resp: 16 18  Temp:  97.9 F (36.6 C)  SpO2: 97% 98%    General: Sitting up in bed, no acute distress Respiratory: No increased work of breathing.  Left lower extremity: Dressings clean, dry, intact.  No significant tenderness with palpation over the hip.  Endorses sensation throughout extremity.  Tolerates gentle ankle range of motion.  Compartment soft and compressible.  Foot warm and well-perfused.  IMAGING: Stable post op imaging.   LABS:  Results for orders placed or performed during the hospital encounter of 03/22/22 (from the past 24 hour(s))  CBC     Status: Abnormal   Collection Time: 03/22/22  2:11 PM  Result Value Ref Range   WBC 11.0 (H) 4.0 - 10.5 K/uL   RBC 3.28 (L) 3.87 - 5.11 MIL/uL   Hemoglobin 10.5 (L) 12.0 - 15.0 g/dL   HCT 62.2 (L) 29.7 - 98.9 %   MCV 95.1 80.0 - 100.0 fL   MCH 32.0 26.0 - 34.0 pg   MCHC 33.7 30.0 - 36.0 g/dL   RDW 21.1 94.1 - 74.0 %   Platelets 158 150 - 400 K/uL   nRBC 0.0 0.0 - 0.2 %  Creatinine, serum     Status: None   Collection Time: 03/22/22  2:11 PM  Result Value Ref Range   Creatinine, Ser 0.52 0.44 - 1.00 mg/dL   GFR, Estimated >81 >44 mL/min  Basic metabolic panel     Status: Abnormal   Collection Time: 03/23/22  4:03 AM  Result Value Ref Range   Sodium 136 135 - 145 mmol/L   Potassium 3.2 (L) 3.5 - 5.1 mmol/L   Chloride 105 98 - 111 mmol/L   CO2 24 22 - 32 mmol/L   Glucose, Bld 148 (H) 70 - 99 mg/dL   BUN 9 8 - 23 mg/dL   Creatinine, Ser 8.18 0.44 - 1.00 mg/dL   Calcium 8.6 (L) 8.9 - 10.3 mg/dL   GFR, Estimated >56 >31 mL/min   Anion gap 7 5 - 15  CBC     Status: Abnormal    Collection Time: 03/23/22  4:03 AM  Result Value Ref Range   WBC 10.4 4.0 - 10.5 K/uL   RBC 2.90 (L) 3.87 - 5.11 MIL/uL   Hemoglobin 9.3 (L) 12.0 - 15.0 g/dL   HCT 49.7 (L) 02.6 - 37.8 %   MCV 93.8 80.0 - 100.0 fL   MCH 32.1 26.0 - 34.0 pg   MCHC 34.2 30.0 - 36.0 g/dL   RDW 58.8 50.2 - 77.4 %   Platelets 155 150 - 400 K/uL   nRBC 0.0 0.0 - 0.2 %    ASSESSMENT: Tina Bowman is a 76 y.o. 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 Hemodynamically stable  PLAN: Weightbearing: NWB LLE ROM: Okay for hip and knee ROM as tolerated Incisional and dressing care: OK to remove dressings 03/24/2022 and leave open to air with dry gauze PRN  Showering: Okay to begin showering getting incisions wet 03/25/2022 Orthopedic device(s): None  Pain management:  1. Tylenol 325-650 mg q 6 hours PRN 2. Norco 5-325 mg q 4 hours PRN severe pain 3. Morphine 0.5-1 mg q 2 hours PRN breakthrough pain 4. Tramadol 50 mg q 6 hours scheduled VTE prophylaxis: Lovenox, SCDs ID:  Ancef 2gm post op Foley/Lines:  No foley, KVO IVFs Impediments to Fracture Healing: Level pending, will start supplementation as indicated Dispo: PT/OT evaluation ongoing, currently recommending SNF versus home health.  TOC following.  We will plan to remove dressings tomorrow.    D/C recommendations: - Norco for pain control - ASA 325 BIF x 30 days for DVT prophylaxis - Possible need for Vit D supplementation  Follow - up plan: 2 weeks after d/c for wound check and repeat x-rays   Contact information:  Truitt Merle MD, Thyra Breed PA-C. After hours and holidays please check [email or web address] for group call information for [facility]   Thompson Caul, PA-C [phone] (office) [email or web address]

## 2022-03-23 NOTE — TOC Progression Note (Addendum)
Transition of Care [facility]) - Progression Note    Patient Details  Name: Tina Bowman MRN: [id number] Date of Birth: 09-28-1946  [facility]) CM/SW Contact  Tina Frederick, LCSW Phone Number: 03/23/2022, 3:59 PM  Clinical Narrative:   [facility] not accept [employer].  CSW LM with pt husband to discuss other SNF options.   1610: CSW spoke with husband Tina Bowman.  He does not want [facility].  Will visit  [facility].  Permission given to send out referral in hub, also would like to consider [facility] options, which were provided to him.    Expected Discharge Plan: Skilled Nursing Facility Barriers to Discharge: Continued Medical Work up  Expected Discharge Plan and Services Expected Discharge Plan: Skilled Nursing Facility   Discharge Planning Services: CM Consult   Living arrangements for the past 2 months: Single Family Home                                       Social Determinants of Health (SDOH) Interventions    Readmission Risk Interventions     View : No data to display.

## 2022-03-23 NOTE — TOC Initial Note (Addendum)
Transition of Care [facility]) - Initial/Assessment Note    Patient Details  Name: Tina Bowman MRN: [id number] Date of Birth: October 13, 1946  [facility] CM/SW Contact:    Tina Lesches, RN Phone Number: 03/23/2022, 12:27 PM  Clinical Narrative:                 RNCM received consult for possible SNF placement at time of discharge. RNCM spoke with patient and husband regarding potential need for SNF placement at time of discharge.Husband reported he is currently unable to care for patient at their home given patient's current physical needs and fall risk.  Patient expressed understanding recommendation and is agreeable to SNF placement at time of discharge. Patient reports preference for [facility]. RNCM discussed insurance authorization process and provided Medicare SNF ratings list. No further questions reported at this time. RNCM to continue to follow and assist with discharge planning needs.  PT evaluation pending... Once PT evaluation completed. TOC team will fax out SNF referrals.  Expected Discharge Plan: Skilled Nursing Facility Barriers to Discharge: Continued Medical Work up   Patient Goals and CMS Choice     Choice offered to / list presented to : Patient, Spouse  Expected Discharge Plan and Services Expected Discharge Plan: Skilled Nursing Facility   Discharge Planning Services: CM Consult   Living arrangements for the past 2 months: Single Family Home                                      Prior Living Arrangements/Services Living arrangements for the past 2 months: Single Family Home Lives with:: Spouse Patient language and need for interpreter reviewed:: Yes Do you feel safe going back to the place where you live?: Yes      Need for Family Participation in Patient Care: Yes (Comment) Care giver support system in place?: Yes (comment)   Criminal Activity/Legal Involvement Pertinent to Current Situation/Hospitalization: No - Comment  as needed  Activities of Daily Living   ADL Screening (condition at time of admission) Patient's cognitive ability adequate to safely complete daily activities?: No Is the patient deaf or have difficulty hearing?: No Does the patient have difficulty seeing, even when wearing glasses/contacts?: No Does the patient have difficulty concentrating, remembering, or making decisions?: Yes Independently performs ADLs?: No Communication: Needs assistance Is this a change from baseline?: Pre-admission baseline  Permission Sought/Granted   Permission granted to share information with : Yes, Verbal Permission Granted  Share Information with NAME: Tina Bowman ( husband) [phone]           Emotional Assessment Appearance:: Appears stated age Attitude/Demeanor/Rapport: Gracious Affect (typically observed): Accepting Orientation: : Oriented to Self, Oriented to Place Alcohol / Substance Use: Not Applicable Psych Involvement: No (comment)  Admission diagnosis:  Fracture, subtrochanteric, left femur, closed, with nonunion, subsequent encounter [S72.22XK] Patient Active Problem List   Diagnosis Date Noted   Fracture, subtrochanteric, left femur, closed, with nonunion, subsequent encounter 03/22/2022   Hip fracture (HCC) 06/11/2021   Confusion 06/21/2017   PCP:  Tina Reichmann, MD Pharmacy:   [facility] - [place], [place] - [address] [place] [address] Phone: [phone] Fax: [phone]     Social Determinants of Health (SDOH) Interventions    Readmission Risk Interventions     View : No data to display.

## 2022-03-23 NOTE — Evaluation (Signed)
Physical Therapy Evaluation Patient Details Name: Tina Bowman MRN: [id number] DOB: November 10, 1945 Today's Date: 03/23/2022  History of Present Illness  Patient is 76 y.o. female with history of initial Lt hip fx in August of 2022 and underwent IM nail on 06/11/21 that has gone to a non-union and pt returned for presents now for repair of the nonunion. Pt now s/p repair of non-union, removal of prior hardware, and revision with IM nail fixation on 03/22/22. PMH significant for Parkinson's disease, early dementia, sleep apnea, HTN, COPD, CAD, and fibromyalgia.    Clinical Impression  Tina Bowman is 76 y.o. female admitted with above HPI and diagnosis. Patient is currently limited by functional impairments below (see PT problem list). Patient lives with her spouse and prior to fall in April was mobilizing with RW independently in home. Since fall she has been reliant on her husband to assist with all transfers and ADL's. Currently pt requires mod-max assist to bed mobility and transfers with RW and 2+ assist for safety. Pt is unable to maintain NWB status on Lt LE throughout mobility. Patient will benefit from continued skilled PT interventions to address impairments and progress independence with mobility, recommending ST rehab at [facility]. Acute PT will follow and progress as able.        Recommendations for follow up therapy are one component of a multi-disciplinary discharge planning process, led by the attending physician.  Recommendations may be updated based on patient status, additional functional criteria and insurance authorization.  PT Recommendation   Follow Up Recommendations Skilled nursing-short term rehab (<3 hours/day) Filed 03/23/2022 1123  Assistance recommended at discharge Frequent or constant Supervision/Assistance Filed 03/23/2022 1123  Patient can return home with the following Two people to help with walking and/or transfers, Two people to help with bathing/dressing/bathroom,  Direct supervision/assist for medications management, Assist for transportation, Help with stairs or ramp for entrance, Assistance with cooking/housework Filed 03/23/2022 1123  Functional Status Assessment Patient has had a recent decline in their functional status and demonstrates the ability to make significant improvements in function in a reasonable and predictable amount of time. Filed 03/23/2022 1123  PT equipment None recommended by PT Filed 03/23/2022 1123    Mobility  Bed Mobility Overal bed mobility: Needs Assistance Bed Mobility: Supine to Sit     Supine to sit: Mod assist, HOB elevated     General bed mobility comments: verbal/tactile ceus for sequencing, assist to bring LE's off EOB and raise trunk. pt able to steady at EOB.    Transfers Overall transfer level: Needs assistance Equipment used: Rolling walker (2 wheels) Transfers: Sit to/from Stand, Bed to chair/wheelchair/BSC Sit to Stand: Mod assist, From elevated surface   Step pivot transfers: Mod assist, From elevated surface, +2 safety/equipment, Max assist       General transfer comment: Multimodal cues for hand placement and power up from EOB. Pt initiated power up but required mod assist to complete rise. Mod-Max +2 to pivot to recliner and guide walker and assist to maintain NWB on Lt LE. Therpist placed foot under pt's Lt foot as tactile cue. pt unable to fully prevent WB but did attempt to use UE's to reduce pressure on Lt LE.    Ambulation/Gait                  Stairs            Wheelchair Mobility    Modified Rankin (Stroke Patients Only)       Balance Overall  balance assessment: Needs assistance Sitting-balance support: Feet supported, Bilateral upper extremity supported Sitting balance-Leahy Scale: Poor     Standing balance support: Reliant on assistive device for balance, During functional activity, Bilateral upper extremity supported Standing balance-Leahy Scale: Poor                                Pertinent Vitals/Pain Pain Assessment Pain Assessment: Faces Faces Pain Scale: Hurts a little bit Pain Location: L hip/groin Pain Descriptors / Indicators: Discomfort, Guarding Pain Intervention(s): Limited activity within patient's tolerance, Monitored during session, Repositioned     03/23/22 1123  Home Living  Family/patient expects to be discharged to: Private residence  Living Arrangements Spouse/significant other  Available Help at Discharge Family;Available 24 hours/day  Type of Home House  Home Access Ramped entrance  Home Layout One level  Home Equipment Rollator (4 wheels);Grab bars - tub/shower;Toilet riser;BSC/3in1;Transport chair;Wheelchair - Forensic psychologistmanual;Rolling Walker (2 wheels);Shower seat;Hand held shower head;Grab bars - toilet  Additional Comments pt lives with husband and has assist  Prior Function  Prior Level of Function  Needs assist  Mobility Comments Pt had recovered to mod Independence with RW after Lt ORIF last August. Had a fall in April 2023 and has been limited since then with NWB on Lt LE and requires assist from husband for transfers to wc.  ADLs Comments Chart states patient continues to feed herself, spouse assist as needed for ADL.  Spouse assist with meds, community mobility and IADL.      Hand Dominance    Right    Extremity/Trunk Assessment    UE: Generalized weakness;     LE: Generalized weakness;       Communication    Expressive difficulties;Other (comment);Will answer yeah to most questions, does answer now and short 1-4 word answers occasionally  Cognition Arousal/Alertness: Awake/alert Behavior During Therapy: Flat affect Overall Cognitive Status: History of cognitive impairments - at baseline                                 General Comments: Pt answers with simple 1-4 words to simple questions. most often answers yeah or no. pt able to follow cues for tasks.        General  Comments      Exercises General Exercises - Lower Extremity Ankle Circles/Pumps: AROM, Both, 20 reps     03/23/22 1123  PT - End of Session  Equipment Utilized During Treatment Gait belt  Activity Tolerance Patient tolerated treatment well  Patient left in chair;with call bell/phone within reach;with chair alarm set;with family/visitor present  Nurse Communication Mobility status  PT Assessment  PT Recommendation/Assessment Patient needs continued PT services  PT Visit Diagnosis Muscle weakness (generalized) (M62.81);Difficulty in walking, not elsewhere classified (R26.2);Unsteadiness on feet (R26.81);History of falling (Z91.81);Other symptoms and signs involving the nervous system (R29.898)  PT Problem List Decreased strength;Decreased range of motion;Decreased activity tolerance;Decreased mobility;Decreased balance;Decreased cognition;Decreased knowledge of use of DME;Decreased safety awareness;Decreased knowledge of precautions;Pain  PT Plan  PT Frequency (ACUTE ONLY) Min 3X/week  PT Treatment/Interventions (ACUTE ONLY) DME instruction;Gait training;Stair training;Functional mobility training;Therapeutic activities;Therapeutic exercise;Balance training;Patient/family education  AM-PAC PT "6 Clicks" Mobility Outcome Measure (Version 2)  Help needed turning from your back to your side while in a flat bed without using bedrails? 2  Help needed moving from lying on your back to sitting on the side of a flat  bed without using bedrails? 2  Help needed moving to and from a bed to a chair (including a wheelchair)? 1  Help needed standing up from a chair using your arms (e.g., wheelchair or bedside chair)? 2  Help needed to walk in hospital room? 1  Help needed climbing 3-5 steps with a railing?  1  6 Click Score 9  Consider Recommendation of Discharge To: CIR/SNF/LTACH  Progressive Mobility  What is the highest level of mobility based on the progressive mobility assessment? Level 3 (Stands  with assist) - Balance while standing  and cannot march in place  Activity Stood at bedside;Transferred from bed to chair  PT Recommendation  Follow Up Recommendations Skilled nursing-short term rehab (<3 hours/day)  Assistance recommended at discharge Frequent or constant Supervision/Assistance  Patient can return home with the following Two people to help with walking and/or transfers;Two people to help with bathing/dressing/bathroom;Direct supervision/assist for medications management;Assist for transportation;Help with stairs or ramp for entrance;Assistance with cooking/housework  Functional Status Assessment Patient has had a recent decline in their functional status and demonstrates the ability to make significant improvements in function in a reasonable and predictable amount of time.  PT equipment None recommended by PT  Individuals Consulted  Consulted and Agree with Results and Recommendations Patient;Family member/caregiver  Family Member Consulted pt's spouse  Acute Rehab PT Goals  Patient Stated Goal increase independence to be able to walk again with RW  PT Goal Formulation With patient/family  Time For Goal Achievement 04/06/22  Potential to Achieve Goals Good  PT Time Calculation  PT Start Time (ACUTE ONLY) 1121  PT Stop Time (ACUTE ONLY) 1154  PT Time Calculation (min) (ACUTE ONLY) 33 min  PT General Charges  $$ ACUTE PT VISIT 1 Visit  PT Evaluation  $PT Eval Moderate Complexity 1 Mod  PT Treatments  $Therapeutic Activity 8-22 mins     Wynn Maudlin, DPT Acute Rehabilitation Services Office [phone] Pager [phone]  03/23/22 3:48 PM

## 2022-03-23 NOTE — NC FL2 (Signed)
[place] MEDICAID FL2 LEVEL OF CARE SCREENING TOOL     IDENTIFICATION  Patient Name: Tina Bowman Birthdate: May 20, 1946 Sex: female Admission Date (Current Location): 03/22/2022  [facility] and [place] Number:  Producer, television/film/video and Address:  [facility], [address] [place], [place] [address]      Provider Number: [id number]  Attending Physician Name and Address:  Roby Lofts, MD  Relative Name and Phone Number:       Current Level of Care: Hospital Recommended Level of Care: Skilled Nursing Facility Prior Approval Number:    Date Approved/Denied:   PASRR Number: [id number]  Discharge Plan: SNF    Current Diagnoses: Patient Active Problem List   Diagnosis Date Noted   Fracture, subtrochanteric, left femur, closed, with nonunion, subsequent encounter 03/22/2022   Hip fracture (HCC) 06/11/2021   Confusion 06/21/2017    Orientation RESPIRATION BLADDER Height & Weight     Self, Place  Normal External catheter Weight: 72.6 kg Height:  5\' 2"  (157.5 cm)  BEHAVIORAL SYMPTOMS/MOOD NEUROLOGICAL BOWEL NUTRITION STATUS      Continent Diet (refer to d/c summary)  AMBULATORY STATUS COMMUNICATION OF NEEDS Skin   Extensive Assist Verbally Surgical wounds (s/p Repair of left subtrochanteric nonunion and removal of previous hardware left fem, 5/31)                       Personal Care Assistance Level of Assistance  Bathing, Feeding, Dressing Bathing Assistance: Maximum assistance   Dressing Assistance: Maximum assistance     Functional Limitations Info  Sight, Hearing, Speech Sight Info: Adequate Hearing Info: Adequate Speech Info: Adequate    SPECIAL CARE FACTORS FREQUENCY  PT (By licensed PT), OT (By licensed OT)     PT Frequency: 5x/week, evaluate and treat OT Frequency: 5x/week, evaluate and treat            Contractures Contractures Info: Not present    Additional Factors Info  Code Status, Allergies Code  Status Info: Full Code Allergies Info: Methadone, Niacin And Related, Oxycontin (Oxycodone), Sulfa Antibiotics           Current Medications (03/23/2022):  This is the current hospital active medication list Current Facility-Administered Medications  Medication Dose Route Frequency Provider Last Rate Last Admin   0.9 %  sodium chloride infusion   Intravenous Continuous 05/23/2022, PA-C 75 mL/hr at 03/23/22 0259 New Bag at 03/23/22 0259   acetaminophen (TYLENOL) tablet 325-650 mg  325-650 mg Oral Q6H PRN 05/23/22, PA-C       albuterol (PROVENTIL) (2.5 MG/3ML) 0.083% nebulizer solution 3 mL  3 mL Inhalation Q6H PRN [place] Bali, PA-C       carbidopa-levodopa (SINEMET IR) 25-100 MG per tablet immediate release 2 tablet  2 tablet Oral TID [place] Bali, PA-C   2 tablet at 03/23/22 1013   darifenacin (ENABLEX) 24 hr tablet 7.5 mg  7.5 mg Oral Daily 05/23/22 A, PA-C   7.5 mg at 03/23/22 1013   diltiazem (CARDIZEM CD) 24 hr capsule 120 mg  120 mg Oral q morning 05/23/22 A, PA-C   120 mg at 03/23/22 1013   docusate sodium (COLACE) capsule 100 mg  100 mg Oral BID 05/23/22 A, PA-C   100 mg at 03/23/22 1013   enoxaparin (LOVENOX) injection 40 mg  40 mg Subcutaneous Q24H 05/23/22 A, PA-C   40 mg at 03/23/22 1013   hydrALAZINE (APRESOLINE)  tablet 10 mg  10 mg Oral Q6H PRN [place] Bali, PA-C       hydrochlorothiazide (HYDRODIURIL) tablet 12.5 mg  12.5 mg Oral Daily [place] Bali, PA-C   12.5 mg at 03/23/22 1013   HYDROcodone-acetaminophen (NORCO/VICODIN) 5-325 MG per tablet 1-2 tablet  1-2 tablet Oral Q4H PRN [place] Bali, PA-C       losartan (COZAAR) tablet 50 mg  50 mg Oral Daily Thyra Breed A, PA-C   50 mg at 03/23/22 1013   metoCLOPramide (REGLAN) tablet 5-10 mg  5-10 mg Oral Q8H PRN [place] Bali, PA-C       Or   metoCLOPramide (REGLAN) injection 5-10 mg  5-10 mg Intravenous Q8H PRN [place] Bali, PA-C       montelukast (SINGULAIR) tablet  10 mg  10 mg Oral QHS Thyra Breed A, PA-C   10 mg at 03/22/22 2023   morphine (PF) 2 MG/ML injection 0.5-1 mg  0.5-1 mg Intravenous Q2H PRN [place] Bali, PA-C   1 mg at 03/23/22 0257   ondansetron (ZOFRAN) tablet 4 mg  4 mg Oral Q6H PRN [place] Bali, PA-C       Or   ondansetron (ZOFRAN) injection 4 mg  4 mg Intravenous Q6H PRN Sharon Seller, Sarah A, PA-C       pantoprazole (PROTONIX) EC tablet 40 mg  40 mg Oral Daily Thyra Breed A, PA-C   40 mg at 03/23/22 1013   polyethylene glycol (MIRALAX / GLYCOLAX) packet 17 g  17 g Oral Daily PRN Thyra Breed A, PA-C       potassium chloride SA (KLOR-CON M) CR tablet 20 mEq  20 mEq Oral BID Thyra Breed A, PA-C   20 mEq at 03/23/22 1013   traMADol (ULTRAM) tablet 50 mg  50 mg Oral Q6H McClung, Sarah A, PA-C   50 mg at 03/23/22 6301   umeclidinium-vilanterol (ANORO ELLIPTA) 62.5-25 MCG/ACT 1 puff  1 puff Inhalation Daily PRN West Bali, PA-C         Discharge Medications: Please see discharge summary for a list of discharge medications.  Relevant Imaging Results:  Relevant Lab Results:   Additional Information SS #: [id number]  [place], [place]

## 2022-03-24 ENCOUNTER — Ambulatory Visit: Payer: Medicare HMO | Attending: Neurology

## 2022-03-24 LAB — BASIC METABOLIC PANEL
Anion gap: 7 (ref 5–15)
BUN: 10 mg/dL (ref 8–23)
CO2: 28 mmol/L (ref 22–32)
Calcium: 8.7 mg/dL — ABNORMAL LOW (ref 8.9–10.3)
Chloride: 105 mmol/L (ref 98–111)
Creatinine, Ser: 0.61 mg/dL (ref 0.44–1.00)
GFR, Estimated: >60 mL/min (ref >60)
Glucose, Bld: 100 mg/dL — ABNORMAL HIGH (ref 70–99)
Potassium: 3.1 mmol/L — ABNORMAL LOW (ref 3.5–5.1)
Sodium: 140 mmol/L (ref 135–145)

## 2022-03-24 LAB — CBC
HCT: 26.9 % — ABNORMAL LOW (ref 36.0–46.0)
Hemoglobin: 8.8 g/dL — ABNORMAL LOW (ref 12.0–15.0)
MCH: 31 pg (ref 26.0–34.0)
MCHC: 32.7 g/dL (ref 30.0–36.0)
MCV: 94.7 fL (ref 80.0–100.0)
Platelets: 150 10*3/uL (ref 150–400)
RBC: 2.84 MIL/uL — ABNORMAL LOW (ref 3.87–5.11)
RDW: 13 % (ref 11.5–15.5)
WBC: 9.1 10*3/uL (ref 4.0–10.5)
nRBC: 0 % (ref 0.0–0.2)

## 2022-03-24 MED ORDER — POTASSIUM CHLORIDE CRYS ER 20 MEQ PO TBCR: 20.0000 meq | EXTENDED_RELEASE_TABLET | Freq: Two times a day (BID) | ORAL | 0 refills | Status: DC

## 2022-03-24 MED ORDER — POTASSIUM CHLORIDE CRYS ER 20 MEQ PO TBCR
20.0000 meq | EXTENDED_RELEASE_TABLET | Freq: Three times a day (TID) | ORAL | Status: DC
  Administered 2022-03-24 – 2022-03-28 (×13): 20 meq via ORAL
  Filled 2022-03-24 (×13): qty 1

## 2022-03-24 MED ORDER — ASPIRIN 325 MG PO TBEC: 325.0000 mg | DELAYED_RELEASE_TABLET | Freq: Two times a day (BID) | ORAL | 0 refills | Status: AC

## 2022-03-24 MED ORDER — HYDROCODONE-ACETAMINOPHEN 5-325 MG PO TABS
1.0000 | ORAL_TABLET | ORAL | 0 refills | Status: AC | PRN
Start: 2022-03-24 — End: ?

## 2022-03-24 NOTE — Progress Notes (Signed)
Occupational Therapy Treatment Patient Details Name: Tina Bowman MRN: [id number] DOB: 1946-08-11 Today's Date: 03/24/2022   History of present illness Patient is 76 y.o. female with history of initial Lt hip fx in August of 2022 and underwent IM nail on 06/11/21 that has gone to a non-union and pt returned for presents now for repair of the nonunion. Pt now s/p repair of non-union, removal of prior hardware, and revision with IM nail fixation on 03/22/22. PMH significant for Parkinson's disease, early dementia, sleep apnea, HTN, COPD, CAD, and fibromyalgia.   OT comments  Pt. Seen for skilled OT treatment session.  Focused on grooming tasks for participation and completion of adls.  Pt. Able to complete washing face and combing hair with set up and intermittent cues for completion.  Pt. Continues with mostly yes/no communication but after she combed her hair she said "thank you".  Agree with current d/c recommendations.     Recommendations for follow up therapy are one component of a multi-disciplinary discharge planning process, led by the attending physician.  Recommendations may be updated based on patient status, additional functional criteria and insurance authorization.    Follow Up Recommendations  Skilled nursing-short term rehab (<3 hours/day)    Assistance Recommended at Discharge Frequent or constant Supervision/Assistance  Patient can return home with the following  Two people to help with walking and/or transfers;Help with stairs or ramp for entrance;Assist for transportation;Assistance with cooking/housework;Direct supervision/assist for financial management;Direct supervision/assist for medications management   Equipment Recommendations  None recommended by OT    Recommendations for Other Services      Precautions / Restrictions Precautions Precautions: Fall Precaution Comments: leans back in sit Restrictions LLE Weight Bearing: Non weight bearing Other  Position/Activity Restrictions: Parkinson's hx of falls.       Mobility Bed Mobility                    Transfers                         Balance                                           ADL either performed or assessed with clinical judgement   ADL Overall ADL's : Needs assistance/impaired     Grooming: Wash/dry face;Brushing hair;Set up;Bed level                                 General ADL Comments: pt. able complete bed level grooming tasks with set up    Extremity/Trunk Assessment              Vision       Perception     Praxis      Cognition Arousal/Alertness: Awake/alert Behavior During Therapy: Flat affect Overall Cognitive Status: History of cognitive impairments - at baseline                                 General Comments: Pt answers with simple 1-4 words to simple questions. most often answers yeah or no. pt able to follow cues for tasks.        Exercises      Shoulder Instructions  General Comments      Pertinent Vitals/ Pain       Pain Assessment Pain Assessment: Faces Faces Pain Scale: Hurts a little bit Pain Location: L hip/groin-pulled back her covers and pointed to leg  Home Living                                          Prior Functioning/Environment              Frequency  Min 2X/week        Progress Toward Goals  OT Goals(current goals can now be found in the care plan section)  Progress towards OT goals: Progressing toward goals     Plan      Co-evaluation                 AM-PAC OT "6 Clicks" Daily Activity     Outcome Measure   Help from another person eating meals?: A Little Help from another person taking care of personal grooming?: A Little Help from another person toileting, which includes using toliet, bedpan, or urinal?: Total Help from another person bathing (including washing, rinsing, drying)?: A  Lot Help from another person to put on and taking off regular upper body clothing?: A Little Help from another person to put on and taking off regular lower body clothing?: A Lot 6 Click Score: 14    End of Session    OT Visit Diagnosis: Pain;History of falling (Z91.81);Other symptoms and signs involving cognitive function Pain - Right/Left: Left Pain - part of body: Leg   Activity Tolerance Patient tolerated treatment well   Patient Left in bed;with call bell/phone within reach   Nurse Communication          Time: 4503-8882 OT Time Calculation (min): 8 min  Charges: OT General Charges $OT Visit: 1 Visit OT Treatments $Self Care/Home Management : 8-22 mins  Boneta Lucks, COTA/L Acute Rehabilitation [phone]   Salvadore Oxford 03/24/2022, 1:00 PM

## 2022-03-24 NOTE — Progress Notes (Addendum)
Orthopaedic Trauma Progress Note  SUBJECTIVE: Doing okay today, notes some pain in left hip.Not very conversant at baseline. No chest pain. No SOB. No nausea/vomiting. No other complaints. Potassium continues to be low despite addition of supplementation yesterday.  OBJECTIVE:  Vitals:   03/24/22 0454 03/24/22 0735  BP: (!) 115/57 (!) 139/55  Pulse: (!) 104 (!) 102  Resp: 16 16  Temp: 98.3 F (36.8 C) 98.9 F (37.2 C)  SpO2: 96% 95%    General: Sitting up in bed, no acute distress Respiratory: No increased work of breathing.  Left lower extremity: Dressings removed, incisions clean, dry, intact.  No significant tenderness with palpation over the hip.  Endorses sensation throughout extremity.  Tolerates gentle ankle range of motion.  Compartment soft and compressible.  Foot warm and well-perfused.  IMAGING: Stable post op imaging.   LABS:  Results for orders placed or performed during the hospital encounter of 03/22/22 (from the past 24 hour(s))  Basic metabolic panel     Status: Abnormal   Collection Time: 03/24/22  1:45 AM  Result Value Ref Range   Sodium 140 135 - 145 mmol/L   Potassium 3.1 (L) 3.5 - 5.1 mmol/L   Chloride 105 98 - 111 mmol/L   CO2 28 22 - 32 mmol/L   Glucose, Bld 100 (H) 70 - 99 mg/dL   BUN 10 8 - 23 mg/dL   Creatinine, Ser 0.61 0.44 - 1.00 mg/dL   Calcium 8.7 (L) 8.9 - 10.3 mg/dL   GFR, Estimated >60 >60 mL/min   Anion gap 7 5 - 15  CBC     Status: Abnormal   Collection Time: 03/24/22  1:45 AM  Result Value Ref Range   WBC 9.1 4.0 - 10.5 K/uL   RBC 2.84 (L) 3.87 - 5.11 MIL/uL   Hemoglobin 8.8 (L) 12.0 - 15.0 g/dL   HCT 26.9 (L) 36.0 - 46.0 %   MCV 94.7 80.0 - 100.0 fL   MCH 31.0 26.0 - 34.0 pg   MCHC 32.7 30.0 - 36.0 g/dL   RDW 13.0 11.5 - 15.5 %   Platelets 150 150 - 400 K/uL   nRBC 0.0 0.0 - 0.2 %    ASSESSMENT: JAYLANNI GRAHOVAC is a 76 y.o. female, 2 Days Post-Op s/p REVISON FIXATION OF SUBTROCHANTERIC NONUNION HARDWARE REMOVAL OF FRACTURED  HIP NAIL  CV/Blood loss: Acute blood loss anemia, Hgb 8.8 this morning.   PLAN: Weightbearing: NWB LLE ROM: Okay for hip and knee ROM as tolerated Incisional and dressing care: Removed today, ok to leave open to air Showering: Okay to begin showering and getting incisions wet 03/25/2022 Orthopedic device(s): None  Pain management:  1. Tylenol 325-650 mg q 6 hours PRN 2. Norco 5-325 mg q 4 hours PRN severe pain 3. Morphine 0.5-1 mg q 2 hours PRN breakthrough pain 4. Tramadol 50 mg q 6 hours scheduled VTE prophylaxis: Lovenox, SCDs ID:  Ancef 2gm post op completed Foley/Lines:  No foley, KVO IVFs Impediments to Fracture Healing: Vit D level 35, no additional supplementation needed Dispo: PT/OT currently recommending SNF versus home health.  TOC following.  Increase potassium to TID  D/C recommendations: - Norco for pain control - ASA 325 BID x 30 days for DVT prophylaxis - No need for Vit D supplementation  Follow - up plan: 2 weeks after d/c for wound check and repeat x-rays   Contact information:  Katha Hamming MD, Rushie Nyhan PA-C. After hours and holidays please check [email or web address] for group call  information for [facility]   Gwinda Passe, PA-C [phone] (office) [email or web address]

## 2022-03-24 NOTE — Progress Notes (Signed)
Physical Therapy Treatment Patient Details Name: Tina Bowman MRN: [id number] DOB: 1946/07/22 Today's Date: 03/24/2022   History of Present Illness Patient is 76 y.o. female with history of initial Lt hip fx in August of 2022 and underwent IM nail on 06/11/21 that has gone to a non-union and pt returned for presents now for repair of the nonunion. Pt now s/p repair of non-union, removal of prior hardware, and revision with IM nail fixation on 03/22/22. PMH significant for Parkinson's disease, early dementia, sleep apnea, HTN, COPD, CAD, and fibromyalgia.    PT Comments    Pt will be slow to improve given L LE NWB status.  Emphasis on bridging and transition to EOB, sit to stands in the RW with tactile feedback to decrease w/bearing, warm up exercise and stand pivot transfer to the chair with the RW, significant assist.  Lift pad under pt.    Recommendations for follow up therapy are one component of a multi-disciplinary discharge planning process, led by the attending physician.  Recommendations may be updated based on patient status, additional functional criteria and insurance authorization.  Follow Up Recommendations  Skilled nursing-short term rehab (<3 hours/day)     Assistance Recommended at Discharge Frequent or constant Supervision/Assistance  Patient can return home with the following Two people to help with walking and/or transfers;Two people to help with bathing/dressing/bathroom;Direct supervision/assist for medications management;Assist for transportation;Help with stairs or ramp for entrance;Assistance with cooking/housework   Equipment Recommendations  None recommended by PT    Recommendations for Other Services       Precautions / Restrictions Precautions Precautions: Fall Precaution Comments: leans back in sit Restrictions LLE Weight Bearing: Non weight bearing Other Position/Activity Restrictions: Parkinson's hx of falls.     Mobility  Bed Mobility Overal bed  mobility: Needs Assistance Bed Mobility: Supine to Sit     Supine to sit: Mod assist, +2 for safety/equipment, HOB elevated     General bed mobility comments: bridging with mod A and cues for technique.    Transfers Overall transfer level: Needs assistance Equipment used: Rolling walker (2 wheels) Transfers: Sit to/from Stand, Bed to chair/wheelchair/BSC Sit to Stand: Mod assist, +2 physical assistance, +2 safety/equipment (x2 with foot under L foot to guage w/bearing) Stand pivot transfers: Mod assist, +2 physical assistance         General transfer comment: multimodal cues for hand placement, desired technique/repetitive direction, foot under pt's L foot to guage and moderate her w/bearing.  Pt unable to maintain NWB.  Mod cues and maneuvering the RW with assist to pivot on R LE.    Ambulation/Gait               General Gait Details: Not able due to NWB status   Stairs             Wheelchair Mobility    Modified Rankin (Stroke Patients Only)       Balance Overall balance assessment: Needs assistance Sitting-balance support: Feet supported, Bilateral upper extremity supported, Single extremity supported Sitting balance-Leahy Scale: Poor   Postural control: Posterior lean   Standing balance-Leahy Scale: Poor Standing balance comment: able to hold L LE off the floor/therapist;s foot for short periods of time.                            Cognition Arousal/Alertness: Awake/alert Behavior During Therapy: Flat affect, WFL for tasks assessed/performed Overall Cognitive Status: History of cognitive impairments - at baseline  General Comments: Pt answers with simple 1-4 words to simple questions. most often answers yeah or no. pt able to follow cues for tasks.        Exercises Total Joint Exercises Heel Slides: AAROM, Both, 10 reps, Supine General Exercises - Lower Extremity Ankle Circles/Pumps:  AROM, Both, 10 reps    General Comments        Pertinent Vitals/Pain Pain Assessment Pain Assessment: Faces Faces Pain Scale: Hurts a little bit Pain Location: L hip/groin-pulled back her covers and pointed to leg Pain Descriptors / Indicators: Discomfort, Guarding Pain Intervention(s): Limited activity within patient's tolerance, Monitored during session    Home Living                          Prior Function            PT Goals (current goals can now be found in the care plan section) Acute Rehab PT Goals PT Goal Formulation: With patient/family Time For Goal Achievement: 04/06/22 Potential to Achieve Goals: Good Progress towards PT goals: Progressing toward goals    Frequency    Min 3X/week      PT Plan Current plan remains appropriate    Co-evaluation              AM-PAC PT "6 Clicks" Mobility   Outcome Measure  Help needed turning from your back to your side while in a flat bed without using bedrails?: A Lot Help needed moving from lying on your back to sitting on the side of a flat bed without using bedrails?: A Lot Help needed moving to and from a bed to a chair (including a wheelchair)?: Total Help needed standing up from a chair using your arms (e.g., wheelchair or bedside chair)?: A Lot Help needed to walk in hospital room?: Total Help needed climbing 3-5 steps with a railing? : Total 6 Click Score: 9    End of Session   Activity Tolerance: Patient tolerated treatment well Patient left: in chair;with call bell/phone within reach;with chair alarm set;Other (comment) (lift pad.) Nurse Communication: Mobility status;Weight bearing status PT Visit Diagnosis: Other abnormalities of gait and mobility (R26.89);Muscle weakness (generalized) (M62.81);History of falling (Z91.81)     Time: 1450-1511 PT Time Calculation (min) (ACUTE ONLY): 21 min  Charges:  $Therapeutic Activity: 8-22 mins                     03/24/2022  Jacinto Halim., PT Acute  Rehabilitation Services [phone]  (pager) [phone]  (office)   [name] 03/24/2022, 3:41 PM

## 2022-03-24 NOTE — TOC Progression Note (Addendum)
Transition of Care [facility]) - Progression Note    Patient Details  Name: Tina Bowman MRN: [id number] Date of Birth: 12-Aug-1946  [facility]) CM/SW Contact  Lorri Frederick, LCSW Phone Number: 03/24/2022, 8:47 AM  Clinical Narrative:     [facility] out of network with [employer].  1100: CSW spoke with husband about bed offers.  He is coming to [place] to visit several facilities.  1445: CSW spoke with husband. He does not want [facility], [facility] or [address].  He is going to [facility] now.  Expected Discharge Plan: Skilled Nursing Facility Barriers to Discharge: Continued Medical Work up  Expected Discharge Plan and Services Expected Discharge Plan: Skilled Nursing Facility   Discharge Planning Services: CM Consult   Living arrangements for the past 2 months: Single Family Home                                       Social Determinants of Health (SDOH) Interventions    Readmission Risk Interventions     View : No data to display.

## 2022-03-25 LAB — CBC
HCT: 24.8 % — ABNORMAL LOW (ref 36.0–46.0)
Hemoglobin: 8.2 g/dL — ABNORMAL LOW (ref 12.0–15.0)
MCH: 31.7 pg (ref 26.0–34.0)
MCHC: 33.1 g/dL (ref 30.0–36.0)
MCV: 95.8 fL (ref 80.0–100.0)
Platelets: 141 10*3/uL — ABNORMAL LOW (ref 150–400)
RBC: 2.59 MIL/uL — ABNORMAL LOW (ref 3.87–5.11)
RDW: 13.2 % (ref 11.5–15.5)
WBC: 6.4 10*3/uL (ref 4.0–10.5)
nRBC: 0 % (ref 0.0–0.2)

## 2022-03-25 LAB — BASIC METABOLIC PANEL
Anion gap: 6 (ref 5–15)
BUN: 12 mg/dL (ref 8–23)
CO2: 26 mmol/L (ref 22–32)
Calcium: 8.3 mg/dL — ABNORMAL LOW (ref 8.9–10.3)
Chloride: 105 mmol/L (ref 98–111)
Creatinine, Ser: 0.62 mg/dL (ref 0.44–1.00)
GFR, Estimated: >60 mL/min (ref >60)
Glucose, Bld: 103 mg/dL — ABNORMAL HIGH (ref 70–99)
Potassium: 3.7 mmol/L (ref 3.5–5.1)
Sodium: 137 mmol/L (ref 135–145)

## 2022-03-25 NOTE — TOC Progression Note (Addendum)
Transition of Care [facility]) - Progression Note    Patient Details  Name: MONTANNA HELLWIG MRN: [id number] Date of Birth: 1945-12-05  Transition of Care [facility]) CM/SW Contact  Ina Homes, [place] Phone Number: 03/25/2022, 12:27 PM  Clinical Narrative:     SW spoke with pt's husband Jaci Standard [phone]) confirmed wants to move forward with [facility].   SW spoke with Langley Gauss ([facility] [phone]) will give message to Tammy in admissions to accept bed and start insurance Auth  Update 231pm SW spoke to [employer] ([employer]) will start auth  Expected Discharge Plan: [place] Barriers to Discharge: Continued Medical Work up  Expected Discharge Plan and Services Expected Discharge Plan: [place]   Discharge Planning Services: CM Consult   Living arrangements for the past 2 months: Single Family Home                                       Social Determinants of Health (SDOH) Interventions    Readmission Risk Interventions     View : No data to display.

## 2022-03-25 NOTE — Progress Notes (Signed)
Orthopaedic Trauma Service Progress Note  Patient ID: Tina Bowman MRN: [id number] DOB/AGE: December 15, 1945 76 y.o.  Subjective:  No acute issues Husband at bedside  They have selected [facility]    ROS As above  Objective:   VITALS:   Vitals:   03/24/22 1701 03/24/22 2048 03/25/22 0500 03/25/22 0840  BP: (!) 128/54 (!) 125/45 (!) 133/52 (!) 148/64  Pulse: (!) 102 (!) 103 88 (!) 103  Resp: 18 20 18 18   Temp: 99 F (37.2 C) 98.9 F (37.2 C) 98.6 F (37 C) 99 F (37.2 C)  TempSrc: Oral Oral Oral Oral  SpO2: 96% 95% 96% 95%  Weight:      Height:        Estimated body mass index is 29.26 kg/m as calculated from the following:   Height as of this encounter: 5\' 2"  (1.575 m).   Weight as of this encounter: 72.6 kg.   Intake/Output      06/02 0701 06/03 0700 06/03 0701 06/04 0700   P.O. 720 240   Total Intake(mL/kg) 720 (9.9) 240 (3.3)   Urine (mL/kg/hr) 850 (0.5) 400 (1.1)   Stool  0   Total Output 850 400   Net -130 -160        Urine Occurrence 1 x 1 x     LABS  Results for orders placed or performed during the hospital encounter of 03/22/22 (from the past 24 hour(s))  CBC     Status: Abnormal   Collection Time: 03/25/22  2:47 AM  Result Value Ref Range   WBC 6.4 4.0 - 10.5 K/uL   RBC 2.59 (L) 3.87 - 5.11 MIL/uL   Hemoglobin 8.2 (L) 12.0 - 15.0 g/dL   HCT 03/24/22 (L) 05/25/22 - 36.6 %   MCV 95.8 80.0 - 100.0 fL   MCH 31.7 26.0 - 34.0 pg   MCHC 33.1 30.0 - 36.0 g/dL   RDW 44.0 34.7 - 42.5 %   Platelets 141 (L) 150 - 400 K/uL   nRBC 0.0 0.0 - 0.2 %  Basic metabolic panel     Status: Abnormal   Collection Time: 03/25/22  2:47 AM  Result Value Ref Range   Sodium 137 135 - 145 mmol/L   Potassium 3.7 3.5 - 5.1 mmol/L   Chloride 105 98 - 111 mmol/L   CO2 26 22 - 32 mmol/L   Glucose, Bld 103 (H) 70 - 99 mg/dL   BUN 12 8 - 23 mg/dL   Creatinine, Ser 38.7 0.44 - 1.00 mg/dL   Calcium  8.3 (L) 8.9 - 10.3 mg/dL   GFR, Estimated 05/25/22 5.64 mL/min   Anion gap 6 5 - 15     PHYSICAL EXAM:   Gen: sitting up in bed, appears comfortable  Lungs: unlabored Cardiac: reg Ext:       Left Lower Extremity   Incisions clean and dry   Distal motor and sensory functions grossly intact  Ext warm   Expected degree of swelling  Good perfusion distally   + DP pulse  Assessment/Plan: 3 Days Post-Op     Anti-infectives (From admission, onward)    Start     Dose/Rate Route Frequency Ordered Stop   03/22/22 1530  ceFAZolin (ANCEF) IVPB 2g/100 mL premix        2 g 200  mL/hr over 30 Minutes Intravenous Every 8 hours 03/22/22 1156 03/23/22 2033   03/22/22 0933  vancomycin (VANCOCIN) powder  Status:  Discontinued          As needed 03/22/22 0934 03/22/22 1006   03/22/22 0600  ceFAZolin (ANCEF) IVPB 2g/100 mL premix        2 g 200 mL/hr over 30 Minutes Intravenous On call to O.R. 03/22/22 0557 03/22/22 0802   03/22/22 0600  ceFAZolin (ANCEF) 2-4 GM/100ML-% IVPB       Note to Pharmacy: [facility] A: cabinet override      03/22/22 0600 03/23/22 0014     .  POD/HD#: 5  76 y/o female s/p repair of L subtrochanteric femur nonunion   -L subtrochanteric femur nonunion s/p exchange nailing  NWB L leg  Unrestricted ROM L hip and knee  Ok to shower and clean wounds with soap and water   Therapy evals   Ice PRN   - Pain management:  Multimodal    Tylenol, norco, ultram - ABL anemia/Hemodynamics  Cbc in am   - Medical issues   Home meds  - DVT/PE prophylaxis:  Asa 325 mg BID x 30 days - ID:   Periop abx completed  - Metabolic Bone Disease:  Vitamin d levels ok   - Activity:  As above  - FEN/GI prophylaxis/Foley/Lines:  Heart healthy diet  - Impediments to fracture healing:  Established nonunion   - Dispo:  SNF once bed available   Ortho issues stable     Mearl Latin, PA-C [phone] (C) 03/25/2022, 12:15 PM  [facility] [address] [place] [address] [phone] [phone] (F)    After 5pm and on the weekends please log on to Amion, go to orthopaedics and the look under the [facility] Call for the provider(s) on call. You can also call our office at [phone] and then follow the prompts to be connected to the call team.   Patient ID: Tina Bowman, female   DOB: [birth year], 76 y.o.   MRN: [id number]

## 2022-03-25 NOTE — Plan of Care (Signed)
  Problem: Education: Goal: Knowledge of General Education information will improve Description Including pain rating scale, medication(s)/side effects and non-pharmacologic comfort measures Outcome: Progressing   

## 2022-03-26 ENCOUNTER — Other Ambulatory Visit: Payer: Self-pay

## 2022-03-26 NOTE — Progress Notes (Signed)
Orthopaedic Trauma Service Progress Note  Patient ID: Tina Bowman MRN: [id number] DOB/AGE: 03/23/1946 76 y.o.  Subjective:  No acute issues Info sent to SNF, waiting for insurance auth  ROS As above  Objective:   VITALS:   Vitals:   03/25/22 0840 03/25/22 1555 03/25/22 2035 03/26/22 0858  BP: (!) 148/64 (!) 115/55 136/62 127/62  Pulse: (!) 103 88 92 96  Resp: 18   (!) 22  Temp: 99 F (37.2 C) 99.3 F (37.4 C) 98.2 F (36.8 C) 98.2 F (36.8 C)  TempSrc: Oral Oral Oral Oral  SpO2: 95% 96% 95% 96%  Weight:      Height:        Estimated body mass index is 29.26 kg/m as calculated from the following:   Height as of this encounter: 5\' 2"  (1.575 m).   Weight as of this encounter: 72.6 kg.   Intake/Output      06/03 0701 06/04 0700 06/04 0701 06/05 0700   P.O. 360 120   Total Intake(mL/kg) 360 (5) 120 (1.7)   Urine (mL/kg/hr) 950 (0.5) 600 (1.5)   Stool 0    Total Output 950 600   Net -590 -480          LABS  No results found for this or any previous visit (from the past 24 hour(s)).   PHYSICAL EXAM:   Gen: sitting up in chair, NAD, appears comfortable  Lungs: unlabored Cardiac: reg Ext:       Left Lower Extremity              Incisions clean and dry              Distal motor and sensory functions grossly intact             Ext warm              Expected degree of swelling             Good perfusion distally              + DP pulse  Assessment/Plan: 4 Days Post-Op   Principal Problem:   Fracture, subtrochanteric, left femur, closed, with nonunion, subsequent encounter   Anti-infectives (From admission, onward)    Start     Dose/Rate Route Frequency Ordered Stop   03/22/22 1530  ceFAZolin (ANCEF) IVPB 2g/100 mL premix        2 g 200 mL/hr over 30 Minutes Intravenous Every 8 hours 03/22/22 1156 03/23/22 2033   03/22/22 0933  vancomycin (VANCOCIN) powder  Status:   Discontinued          As needed 03/22/22 0934 03/22/22 1006   03/22/22 0600  ceFAZolin (ANCEF) IVPB 2g/100 mL premix        2 g 200 mL/hr over 30 Minutes Intravenous On call to O.R. 03/22/22 0557 03/22/22 0802   03/22/22 0600  ceFAZolin (ANCEF) 2-4 GM/100ML-% IVPB       Note to Pharmacy: 03/24/22 A: cabinet override      03/22/22 0600 03/23/22 0014     .  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             NWB L leg  Unrestricted ROM L hip and knee             Ok to shower and clean wounds with soap and water              Therapies              Ice PRN    - Pain management:             Multimodal                          Tylenol, norco, ultram  - ABL anemia/Hemodynamics             Cbc in am   Asymptomatic   BPs look good   - Medical issues              Home meds   - DVT/PE prophylaxis:             Asa 325 mg BID x 30 days - ID:              Periop abx completed   - Metabolic Bone Disease:             Vitamin d levels ok    - Activity:             As above   - FEN/GI prophylaxis/Foley/Lines:             Heart healthy diet   - Impediments to fracture healing:             Established nonunion    - Dispo:             SNF once bed available              Ortho issues stable     Mearl Latin, PA-C [phone] (C) 03/26/2022, 12:27 PM  [facility] [address] [place] [address] [phone] [phone] (F)    After 5pm and on the weekends please log on to Amion, go to orthopaedics and the look under the [facility] Call for the provider(s) on call. You can also call our office at [phone] and then follow the prompts to be connected to the call team.   Patient ID: Tina Bowman, female   DOB: [birth year], 76 y.o.   MRN: [id number]

## 2022-03-26 NOTE — Plan of Care (Signed)
  Problem: Health Behavior/Discharge Planning: Goal: Ability to manage health-related needs will improve Outcome: Progressing   

## 2022-03-27 ENCOUNTER — Ambulatory Visit: Payer: Medicare HMO

## 2022-03-27 LAB — CBC
HCT: 28.9 % — ABNORMAL LOW (ref 36.0–46.0)
Hemoglobin: 9.4 g/dL — ABNORMAL LOW (ref 12.0–15.0)
MCH: 31.6 pg (ref 26.0–34.0)
MCHC: 32.5 g/dL (ref 30.0–36.0)
MCV: 97.3 fL (ref 80.0–100.0)
Platelets: 226 10*3/uL (ref 150–400)
RBC: 2.97 MIL/uL — ABNORMAL LOW (ref 3.87–5.11)
RDW: 13.1 % (ref 11.5–15.5)
WBC: 6.8 10*3/uL (ref 4.0–10.5)
nRBC: 0 % (ref 0.0–0.2)

## 2022-03-27 NOTE — Progress Notes (Signed)
Physical Therapy Treatment Patient Details Name: Tina Bowman MRN: [id number] DOB: 07-18-1946 Today's Date: 03/27/2022   History of Present Illness Patient is 76 y.o. female with history of initial Lt hip fx in August of 2022 and underwent IM nail on 06/11/21 that has gone to a non-union and pt returned for presents now for repair of the nonunion. Pt now s/p repair of non-union, removal of prior hardware, and revision with IM nail fixation on 03/22/22. PMH significant for Parkinson's disease, early dementia, sleep apnea, HTN, COPD, CAD, and fibromyalgia.    PT Comments    Pt demonstrated increased level of functional mobility this session. Able to perform transfer to recliner from EOB while adequately maintaining NWB precautions with multimodal cueing. Pt will have difficulty progressing mobility further until weight bearing precautions change due to cognitive deficits but will continue to benefit from acute PT to improve balance and quality of transfers. SNF continues to be the most appropriate discharge disposition due to pts decreased functional mobility and required assist levels.    Recommendations for follow up therapy are one component of a multi-disciplinary discharge planning process, led by the attending physician.  Recommendations may be updated based on patient status, additional functional criteria and insurance authorization.  Follow Up Recommendations  Skilled nursing-short term rehab (<3 hours/day)     Assistance Recommended at Discharge Frequent or constant Supervision/Assistance  Patient can return home with the following Two people to help with walking and/or transfers;Two people to help with bathing/dressing/bathroom;Direct supervision/assist for medications management;Assist for transportation;Help with stairs or ramp for entrance;Assistance with cooking/housework   Equipment Recommendations  None recommended by PT    Recommendations for Other Services        Precautions / Restrictions Precautions Precautions: Fall Restrictions Weight Bearing Restrictions: Yes LLE Weight Bearing: Non weight bearing     Mobility  Bed Mobility Overal bed mobility: Needs Assistance Bed Mobility: Supine to Sit     Supine to sit: Min assist     General bed mobility comments: requiring increased time and VCs to get to EOB, manual trunk assist    Transfers Overall transfer level: Needs assistance Equipment used: Rolling walker (2 wheels) Transfers: Sit to/from Stand, Bed to chair/wheelchair/BSC Sit to Stand: Mod assist, +2 physical assistance Stand pivot transfers: Mod assist, +2 physical assistance         General transfer comment: Pt required consistent VCs for NWB precautions, foot under pts foot to monitor weight bearing. Pt required increased time and VCs for hand and foot placement. Pt able to scoot using RLE to reclincer    Ambulation/Gait                   Stairs             Wheelchair Mobility    Modified Rankin (Stroke Patients Only)       Balance Overall balance assessment: Needs assistance Sitting-balance support: Feet supported, Bilateral upper extremity supported, Single extremity supported Sitting balance-Leahy Scale: Poor Sitting balance - Comments: posterior lean when performing therex in sitting, cues for upright posture Postural control: Posterior lean Standing balance support: Reliant on assistive device for balance, During functional activity, Bilateral upper extremity supported Standing balance-Leahy Scale: Poor Standing balance comment: reliance on RW for support, able to hold up LLE for short periods of time                            Cognition Arousal/Alertness: Awake/alert  Behavior During Therapy: Flat affect, WFL for tasks assessed/performed Overall Cognitive Status: History of cognitive impairments - at baseline                                 General Comments: Pt  requiring increased time to follow commands and provides 1-2 word answers. Responds easier to yes/no questions        Exercises General Exercises - Lower Extremity Long Arc Quad: 10 reps Heel Slides: 10 reps Hip ABduction/ADduction: 10 reps Hip Flexion/Marching: 10 reps    General Comments        Pertinent Vitals/Pain Pain Assessment Pain Assessment: Faces Faces Pain Scale: Hurts a little bit Pain Location: L leg Pain Descriptors / Indicators: Discomfort, Grimacing, Moaning Pain Intervention(s): Limited activity within patient's tolerance, Monitored during session    Home Living                          Prior Function            PT Goals (current goals can now be found in the care plan section) Acute Rehab PT Goals Patient Stated Goal: increase independence to be able to walk again with RW PT Goal Formulation: With patient/family Time For Goal Achievement: 04/06/22 Potential to Achieve Goals: Good Progress towards PT goals: Progressing toward goals    Frequency    Min 3X/week      PT Plan Current plan remains appropriate    Co-evaluation              AM-PAC PT "6 Clicks" Mobility   Outcome Measure  Help needed turning from your back to your side while in a flat bed without using bedrails?: A Lot Help needed moving from lying on your back to sitting on the side of a flat bed without using bedrails?: A Lot Help needed moving to and from a bed to a chair (including a wheelchair)?: Total Help needed standing up from a chair using your arms (e.g., wheelchair or bedside chair)?: A Lot Help needed to walk in hospital room?: Total Help needed climbing 3-5 steps with a railing? : Total 6 Click Score: 9    End of Session Equipment Utilized During Treatment: Gait belt Activity Tolerance: Patient tolerated treatment well Patient left: in chair;with call bell/phone within reach;with chair alarm set Nurse Communication: Mobility status PT Visit  Diagnosis: Other abnormalities of gait and mobility (R26.89);Muscle weakness (generalized) (M62.81);History of falling (Z91.81)     Time: 1155 (Simultaneous filing. User may not have seen previous data.)-1219 (Simultaneous filing. User may not have seen previous data.) PT Time Calculation (min) (ACUTE ONLY): 24 min (Simultaneous filing. User may not have seen previous data.)  Charges:  $Gait Training: 8-22 mins $Therapeutic Exercise: 8-22 mins                    Davina Poke, SPT Acute Rehabilitation Services  Office: [phone]    Davina Poke 03/27/2022, 1:30 PM

## 2022-03-27 NOTE — Progress Notes (Signed)
Mobility Specialist Progress Note   03/27/22 1733  Mobility  Activity Refused mobility   After max encouragement pt deferring session w/ MS d/t tiredness/ sleepiness. Will f/u tomorrow if time permits.   Frederico Hamman Mobility Specialist Phone Number [phone]

## 2022-03-27 NOTE — Care Management Important Message (Signed)
Important Message  Patient Details  Name: Tina Bowman MRN: [id number] Date of Birth: 06/01/1946   Medicare Important Message Given:  Yes     Keyandra Swenson 03/27/2022, 4:20 PM

## 2022-03-27 NOTE — Progress Notes (Signed)
Orthopaedic Trauma Progress Note  SUBJECTIVE: Doing well this AM. No acute issues. Remains medically stable for d/c, awaiting insurance auth for SNF.  OBJECTIVE:  Vitals:   03/26/22 2022 03/27/22 0557  BP: (!) 121/52 (!) 136/59  Pulse: (!) 105 96  Resp: 18 17  Temp: 98.2 F (36.8 C) 98.4 F (36.9 C)  SpO2: 96% 100%    General: Laying in bed, no acute distress Respiratory: No increased work of breathing.  Left lower extremity: Incisions clean, dry, intact.  No significant tenderness with palpation over the hip.  Endorses sensation throughout extremity.  Tolerates gentle ankle range of motion.  Compartment soft and compressible.  Foot warm and well-perfused.  IMAGING: Stable post op imaging.   LABS:  Results for orders placed or performed during the hospital encounter of 03/22/22 (from the past 24 hour(s))  CBC     Status: Abnormal   Collection Time: 03/27/22  2:11 AM  Result Value Ref Range   WBC 6.8 4.0 - 10.5 K/uL   RBC 2.97 (L) 3.87 - 5.11 MIL/uL   Hemoglobin 9.4 (L) 12.0 - 15.0 g/dL   HCT 70.0 (L) 17.4 - 94.4 %   MCV 97.3 80.0 - 100.0 fL   MCH 31.6 26.0 - 34.0 pg   MCHC 32.5 30.0 - 36.0 g/dL   RDW 96.7 59.1 - 63.8 %   Platelets 226 150 - 400 K/uL   nRBC 0.0 0.0 - 0.2 %    ASSESSMENT: Tina Bowman is a 76 y.o. female, 5 Days Post-Op s/p REVISON FIXATION OF SUBTROCHANTERIC NONUNION HARDWARE REMOVAL OF FRACTURED HIP NAIL  CV/Blood loss: Hgb improving, up to 9.4 this AM  PLAN: Weightbearing: NWB LLE ROM: Okay for hip and knee ROM as tolerated Incisional and dressing care: Ok to leave open to air Showering: Okay to shower and get incisions wet Orthopedic device(s): None  Pain management:  1. Tylenol 325-650 mg q 6 hours PRN 2. Norco 5-325 mg q 4 hours PRN severe pain 3. Morphine 0.5-1 mg q 2 hours PRN breakthrough pain 4. Tramadol 50 mg q 6 hours scheduled VTE prophylaxis: Lovenox, SCDs ID:  Ancef 2gm post op completed Foley/Lines:  No foley, KVO  IVFs Impediments to Fracture Healing: Vit D level 35, no additional supplementation needed Dispo: PT/OT currently recommending SNF, awaiting insurance auth. Stable for d/c  D/C recommendations: - Norco for pain control - ASA 325 BID x 30 days for DVT prophylaxis - No need for Vit D supplementation  Follow - up plan: 2 weeks after d/c for wound check and repeat x-rays   Contact information:  Truitt Merle MD, Thyra Breed PA-C. After hours and holidays please check [email or web address] for group call information for [facility]   Thompson Caul, PA-C [phone] (office) [email or web address]

## 2022-03-27 NOTE — TOC Progression Note (Addendum)
Transition of Care [facility]) - Progression Note    Patient Details  Name: Tina Bowman MRN: [id number] Date of Birth: 1945/12/05  Transition [facility] Care [facility]) CM/SW Contact  Lorri Frederick, LCSW Phone Number: 03/27/2022, 10:25 AM  Clinical Narrative:  CSW spoke with [facility], auth still pending with [employer].    1410: New PT notes sent to [facility] as requested for auth request.   Expected Discharge Plan: Skilled Nursing Facility Barriers to Discharge: Continued Medical Work up  Expected Discharge Plan and Services Expected Discharge Plan: Skilled Nursing Facility   Discharge Planning Services: CM Consult   Living arrangements for the past 2 months: Single Family Home                                       Social Determinants of Health (SDOH) Interventions    Readmission Risk Interventions     View : No data to display.

## 2022-03-28 NOTE — Discharge Summary (Addendum)
Orthopaedic Trauma Service (OTS) Discharge Summary   Patient ID: Tina Bowman MRN: [id number] DOB/AGE: June 29, 1946 76 y.o.  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 disorders clinic, and replaced with Atypical Parkinsonism..   Sleep apnea    Slurred speech    foggy minded     Procedures Performed:  CPT 27240-Repair of left subtrochanteric nonunion CPT 20680-Removal of previous hardware left femur    Discharged Condition: stable  Hospital Course: Patient presented to [facility] on 03/22/2022 for surgical repair of left femur nonunion.  Was taken to the operating room by Dr. Jena Gauss for the above procedure, tolerated this without complications.  Was admitted postoperatively for pain control and therapies.  We are working physical and occupational therapy starting on postoperative day #1, it was felt she would require SNF at discharge.  Was started on Lovenox for DVT prophylaxis starting on postoperative day #1.  Remainder of the patient's hospitalization was dedicated to achieving adequate pain control and increasing mobility. On 03/28/2022, the patient was tolerating diet, working well with therapies, pain well controlled, vital signs stable, dressings clean, dry, intact and felt stable for discharge to SNF. Patient will follow up as below and knows to call with questions or concerns.     Consults:  None  Significant Diagnostic Studies:   Results for orders placed or performed during the hospital encounter of 03/22/22 (from the past 168 hour(s))  CBC   Collection Time: 03/22/22  2:11 PM  Result Value Ref Range   WBC 11.0 (H) 4.0 - 10.5 K/uL   RBC 3.28 (L) 3.87 - 5.11 MIL/uL   Hemoglobin 10.5 (L) 12.0 - 15.0 g/dL   HCT 86.5 (L) 78.4 - 69.6 %   MCV 95.1 80.0 - 100.0 fL   MCH 32.0 26.0 - 34.0 pg   MCHC 33.7 30.0 - 36.0 g/dL   RDW 29.5 28.4 - 13.2 %   Platelets 158 150 - 400 K/uL   nRBC 0.0 0.0 - 0.2 %  Creatinine, serum   Collection Time: 03/22/22  2:11 PM  Result Value Ref Range   Creatinine, Ser 0.52 0.44 - 1.00 mg/dL   GFR, Estimated >44 >01 mL/min  VITAMIN D 25 Hydroxy (Vit-D Deficiency, Fractures)   Collection Time: 03/22/22  2:11 PM  Result Value Ref Range   Vit D, 25-Hydroxy 35.18 30 - 100 ng/mL  Basic metabolic panel   Collection Time: 03/23/22  4:03 AM  Result Value Ref Range   Sodium 136 135 - 145 mmol/L   Potassium 3.2 (L) 3.5 - 5.1 mmol/L   Chloride 105 98 - 111 mmol/L   CO2 24 22 - 32 mmol/L   Glucose, Bld 148 (H) 70 - 99 mg/dL   BUN 9 8 - 23 mg/dL   Creatinine, Ser 0.27 0.44 - 1.00 mg/dL   Calcium 8.6 (L) 8.9 - 10.3 mg/dL   GFR, Estimated >25 >36 mL/min   Anion gap 7  5 - 15  CBC   Collection Time: 03/23/22  4:03 AM  Result Value Ref Range   WBC 10.4 4.0 - 10.5 K/uL   RBC 2.90 (L) 3.87 - 5.11 MIL/uL   Hemoglobin 9.3 (L) 12.0 - 15.0 g/dL   HCT 03.7 (L) 54.3 - 60.6 %   MCV 93.8 80.0 - 100.0 fL   MCH 32.1 26.0 - 34.0 pg   MCHC 34.2 30.0 - 36.0 g/dL   RDW 77.0 34.0 - 35.2 %   Platelets 155 150 - 400 K/uL   nRBC 0.0 0.0 - 0.2 %  Basic metabolic panel   Collection Time: 03/24/22  1:45 AM  Result Value Ref Range   Sodium 140 135 - 145 mmol/L   Potassium 3.1 (L) 3.5 - 5.1 mmol/L   Chloride 105 98 - 111 mmol/L   CO2 28 22 - 32 mmol/L   Glucose, Bld 100 (H) 70 - 99 mg/dL   BUN 10 8 - 23 mg/dL   Creatinine, Ser 4.81 0.44 - 1.00 mg/dL   Calcium 8.7  (L) 8.9 - 10.3 mg/dL   GFR, Estimated >85 >90 mL/min   Anion gap 7 5 - 15  CBC   Collection Time: 03/24/22  1:45 AM  Result Value Ref Range   WBC 9.1 4.0 - 10.5 K/uL   RBC 2.84 (L) 3.87 - 5.11 MIL/uL   Hemoglobin 8.8 (L) 12.0 - 15.0 g/dL   HCT 93.1 (L) 12.1 - 62.4 %   MCV 94.7 80.0 - 100.0 fL   MCH 31.0 26.0 - 34.0 pg   MCHC 32.7 30.0 - 36.0 g/dL   RDW 46.9 50.7 - 22.5 %   Platelets 150 150 - 400 K/uL   nRBC 0.0 0.0 - 0.2 %  CBC   Collection Time: 03/25/22  2:47 AM  Result Value Ref Range   WBC 6.4 4.0 - 10.5 K/uL   RBC 2.59 (L) 3.87 - 5.11 MIL/uL   Hemoglobin 8.2 (L) 12.0 - 15.0 g/dL   HCT 75.0 (L) 51.8 - 33.5 %   MCV 95.8 80.0 - 100.0 fL   MCH 31.7 26.0 - 34.0 pg   MCHC 33.1 30.0 - 36.0 g/dL   RDW 82.5 18.9 - 84.2 %   Platelets 141 (L) 150 - 400 K/uL   nRBC 0.0 0.0 - 0.2 %  Basic metabolic panel   Collection Time: 03/25/22  2:47 AM  Result Value Ref Range   Sodium 137 135 - 145 mmol/L   Potassium 3.7 3.5 - 5.1 mmol/L   Chloride 105 98 - 111 mmol/L   CO2 26 22 - 32 mmol/L   Glucose, Bld 103 (H) 70 - 99 mg/dL   BUN 12 8 - 23 mg/dL   Creatinine, Ser 1.03 0.44 - 1.00 mg/dL   Calcium 8.3 (L) 8.9 - 10.3 mg/dL   GFR, Estimated >12 >81 mL/min   Anion gap 6 5 - 15  CBC   Collection Time: 03/27/22  2:11 AM  Result Value Ref Range   WBC 6.8 4.0 - 10.5 K/uL   RBC 2.97 (L) 3.87 - 5.11 MIL/uL   Hemoglobin 9.4 (L) 12.0 - 15.0 g/dL   HCT 18.8 (L) 67.7 - 37.3 %   MCV 97.3 80.0 - 100.0 fL   MCH 31.6 26.0 - 34.0 pg   MCHC 32.5 30.0 - 36.0 g/dL   RDW 66.8 15.9 - 47.0 %   Platelets 226 150 - 400 K/uL   nRBC 0.0 0.0 - 0.2 %  Treatments: IV hydration, antibiotics: Ancef, analgesia: acetaminophen, Vicodin, Morphine, and Tramadol, cardiac meds: diltiazem, anticoagulation: LMW heparin, therapies: PT and OT, and surgery: as above  Discharge Exam: General: Laying in bed, no acute distress Respiratory: No increased work of breathing.  Left lower extremity: Incisions clean, dry,  intact.  No significant tenderness with palpation over the hip.  Endorses sensation throughout extremity.  Tolerates gentle ankle range of motion.  Compartment soft and compressible.  Foot warm and well-perfused.  Disposition: Discharge disposition: 03-Skilled Nursing Facility        Allergies as of 03/28/2022       Reactions   Methadone Other (See Comments)   Pt doesn't know   Niacin And Related Other (See Comments)   Gives her hot flashes   Oxycontin [oxycodone] Other (See Comments)   Makes her feel strange.   Sulfa Antibiotics Other (See Comments)   Pt doesn't know   Other Rash   Band aids (different brands) causes pt to break out in a rash.         Medication List     TAKE these medications    acetaminophen 500 MG tablet Commonly known as: TYLENOL Take 1,000 mg by mouth every 6 (six) hours as needed for moderate pain or mild pain.   albuterol 108 (90 Base) MCG/ACT inhaler Commonly known as: VENTOLIN HFA Inhale 2 puffs into the lungs every 6 (six) hours as needed for wheezing or shortness of breath.   Anoro Ellipta 62.5-25 MCG/ACT Aepb Generic drug: umeclidinium-vilanterol Inhale 1 puff into the lungs daily as needed (Asthma /COPD).   aspirin EC 325 MG tablet Take 1 tablet (325 mg total) by mouth in the morning and at bedtime. What changed:  medication strength how much to take when to take this   carbidopa-levodopa 25-100 MG tablet Commonly known as: SINEMET IR Take 2 tablets by mouth 3 (three) times daily.   diltiazem 120 MG 24 hr capsule Commonly known as: CARDIZEM CD Take 120 mg by mouth every morning.   hydrochlorothiazide 12.5 MG tablet Commonly known as: HYDRODIURIL Take 12.5 mg by mouth daily.   HYDROcodone-acetaminophen 5-325 MG tablet Commonly known as: NORCO/VICODIN Take 1 tablet by mouth every 4 (four) hours as needed for severe pain.   losartan 50 MG tablet Commonly known as: COZAAR Take 50 mg by mouth daily.   montelukast 10 MG  tablet Commonly known as: SINGULAIR Take 10 mg by mouth at bedtime.   pantoprazole 40 MG tablet Commonly known as: PROTONIX Take 40 mg by mouth daily.   potassium chloride SA 20 MEQ tablet Commonly known as: KLOR-CON M Take 1 tablet (20 mEq total) by mouth 2 (two) times daily for 5 days.   trospium 20 MG tablet Commonly known as: SANCTURA Take 20 mg by mouth 2 (two) times daily.        Contact information for follow-up providers     Haddix, Gillie Manners, MD. Schedule an appointment as soon as possible for a visit in 2 week(s).   Specialty: Orthopedic Surgery Why: for wound check and repeat x-rays Contact information: [address] [place] [address] [phone]              Contact information for after-discharge care     Destination     [facility] Preferred SNF .   Service: Skilled Paramedic information: [address] [place] [address] [phone]  Discharge Instructions and Plan: Patient will be discharged to SNF. Will be discharged on Aspirin for DVT prophylaxis. Patient has been provided with all the necessary DME for discharge. Patient will follow up with Dr. Jena GaussHaddix in 2 weeks for repeat x-rays and wound check.   Signed:  Thompson CaulSarah Y. Poonam Woehrle, PA-C ?[phone]) [phone]? (phone) 03/28/2022, 11:32 AM  [facility] [address] [place] [id number] [phone](O) [phone] (F)

## 2022-03-28 NOTE — TOC Transition Note (Signed)
Transition of Care [facility]) - CM/SW Discharge Note   Patient Details  Name: Tina Bowman MRN: [id number] Date of Birth: 10-05-1946  Transition of Care [facility]) CM/SW Contact:  Lorri Frederick, LCSW Phone Number: 03/28/2022, 11:46 AM   Clinical Narrative:   Pt discharging to [facility], room 701.  RN call [phone] for report.     Final next level of care: Skilled Nursing Facility Barriers to Discharge: Barriers Resolved   Patient Goals and CMS Choice     Choice offered to / list presented to : Patient, Spouse  Discharge Placement              Patient chooses bed at:  ([facility]) Patient to be transferred to facility by: [facility] Name of family member notified: husband [name] Patient and family notified of of transfer: 03/28/22  Discharge Plan and Services   Discharge Planning Services: CM Consult                                 Social Determinants of Health (SDOH) Interventions     Readmission Risk Interventions     View : No data to display.

## 2022-03-28 NOTE — Progress Notes (Signed)
Pt has DC order. RN called report to [facility] and gave report to [facility], LPN. All questions has been answered. Awaiting for [facility] to pick up pt.

## 2022-03-28 NOTE — Progress Notes (Signed)
Orthopaedic Trauma Progress Note  SUBJECTIVE: No acute issues. Remains medically stable for d/c.  Patient's husband has chosen [facility], awaiting insurance auth.  OBJECTIVE:  Vitals:   03/28/22 0423 03/28/22 0810  BP: (!) 109/47 117/60  Pulse: (!) 102 89  Resp: 16 18  Temp: 98.4 F (36.9 C) 98.8 F (37.1 C)  SpO2: 95% 99%    General: Laying in bed, no acute distress Respiratory: No increased work of breathing.  Left lower extremity: Incisions clean, dry, intact.  No significant tenderness with palpation over the hip.  Endorses sensation throughout extremity.  Tolerates gentle ankle range of motion.  Compartment soft and compressible.  Foot warm and well-perfused.  IMAGING: Stable post op imaging.   LABS:  No results found for this or any previous visit (from the past 24 hour(s)).   ASSESSMENT: Tina Bowman is a 76 y.o. female, 6 Days Post-Op s/p REVISON FIXATION OF SUBTROCHANTERIC NONUNION HARDWARE REMOVAL OF FRACTURED HIP NAIL  CV/Blood loss: Hgb stable. Hemodynamically stable  PLAN: Weightbearing: NWB LLE ROM: Okay for hip and knee ROM as tolerated Incisional and dressing care: Ok to leave open to air Showering: Okay to shower and get incisions wet Orthopedic device(s): None  Pain management:  1. Tylenol 325-650 mg q 6 hours PRN 2. Norco 5-325 mg q 4 hours PRN severe pain 3. Morphine 0.5-1 mg q 2 hours PRN breakthrough pain 4. Tramadol 50 mg q 6 hours scheduled VTE prophylaxis: Lovenox, SCDs ID:  Ancef 2gm post op completed Foley/Lines:  No foley, KVO IVFs Impediments to Fracture Healing: Vit D level 35, no additional supplementation needed Dispo: PT/OT currently recommending SNF, awaiting insurance auth. Stable for d/c  D/C recommendations: - Norco for pain control - ASA 325 BID x 30 days for DVT prophylaxis - No need for Vit D supplementation  Follow - up plan: 2 weeks after d/c for wound check and repeat x-rays   Contact information:  Truitt Merle MD, Thyra Breed PA-C. After hours and holidays please check [email or web address] for group call information for [facility]   Thompson Caul, PA-C [phone] (office) [email or web address]

## 2022-03-29 ENCOUNTER — Ambulatory Visit: Payer: Medicare HMO

## 2022-04-03 ENCOUNTER — Ambulatory Visit: Payer: Medicare HMO | Admitting: Physical Therapy

## 2022-04-05 ENCOUNTER — Ambulatory Visit: Payer: Medicare HMO

## 2022-04-10 ENCOUNTER — Ambulatory Visit: Payer: Medicare HMO

## 2022-04-12 ENCOUNTER — Ambulatory Visit: Payer: Medicare HMO

## 2022-04-17 ENCOUNTER — Ambulatory Visit: Payer: Medicare HMO

## 2022-04-19 ENCOUNTER — Ambulatory Visit: Payer: Medicare HMO | Admitting: Physical Therapy

## 2022-05-24 ENCOUNTER — Other Ambulatory Visit: Payer: Self-pay | Admitting: Internal Medicine

## 2022-05-24 ENCOUNTER — Ambulatory Visit
Admission: RE | Admit: 2022-05-24 | Discharge: 2022-05-24 | Disposition: A | Discharge status: Home / Self Care | Payer: Medicare HMO | Source: Ambulatory Visit | Attending: Internal Medicine | Admitting: Internal Medicine

## 2022-05-24 DIAGNOSIS — M7989 Other specified soft tissue disorders: Secondary | ICD-10-CM | POA: Diagnosis not present

## 2022-06-04 NOTE — Addendum Note (Signed)
Addendum  created 06/04/22 2041 by Adair Laundry, CRNA   Intraprocedure Meds edited

## 2022-10-26 ENCOUNTER — Emergency Department: Payer: Medicare HMO

## 2022-10-26 ENCOUNTER — Emergency Department
Admission: EM | Admit: 2022-10-26 | Discharge: 2022-10-27 | Disposition: A | Discharge status: Home / Self Care | Payer: Medicare HMO | Attending: Emergency Medicine | Admitting: Emergency Medicine

## 2022-10-26 ENCOUNTER — Other Ambulatory Visit: Payer: Self-pay

## 2022-10-26 DIAGNOSIS — E876 Hypokalemia: Secondary | ICD-10-CM | POA: Insufficient documentation

## 2022-10-26 DIAGNOSIS — R112 Nausea with vomiting, unspecified: Secondary | ICD-10-CM | POA: Insufficient documentation

## 2022-10-26 DIAGNOSIS — I10 Essential (primary) hypertension: Secondary | ICD-10-CM | POA: Diagnosis not present

## 2022-10-26 DIAGNOSIS — J45909 Unspecified asthma, uncomplicated: Secondary | ICD-10-CM | POA: Insufficient documentation

## 2022-10-26 DIAGNOSIS — Z20822 Contact with and (suspected) exposure to covid-19: Secondary | ICD-10-CM | POA: Diagnosis not present

## 2022-10-26 DIAGNOSIS — J449 Chronic obstructive pulmonary disease, unspecified: Secondary | ICD-10-CM | POA: Insufficient documentation

## 2022-10-26 DIAGNOSIS — G20C Parkinsonism, unspecified: Secondary | ICD-10-CM | POA: Insufficient documentation

## 2022-10-26 DIAGNOSIS — R1110 Vomiting, unspecified: Secondary | ICD-10-CM | POA: Diagnosis present

## 2022-10-26 LAB — URINALYSIS, ROUTINE W REFLEX MICROSCOPIC
Bilirubin Urine: NEGATIVE
Glucose, UA: NEGATIVE mg/dL
Hgb urine dipstick: NEGATIVE
Ketones, ur: 5 mg/dL — AB
Leukocytes,Ua: NEGATIVE
Nitrite: NEGATIVE
Protein, ur: 30 mg/dL — AB
Specific Gravity, Urine: 1.032 — ABNORMAL HIGH (ref 1.005–1.030)
pH: 5 (ref 5.0–8.0)

## 2022-10-26 LAB — COMPREHENSIVE METABOLIC PANEL
ALT: 7 U/L (ref 0–44)
AST: 20 U/L (ref 15–41)
Albumin: 3.6 g/dL (ref 3.5–5.0)
Alkaline Phosphatase: 82 U/L (ref 38–126)
Anion gap: 11 (ref 5–15)
BUN: 22 mg/dL (ref 8–23)
CO2: 25 mmol/L (ref 22–32)
Calcium: 8.3 mg/dL — ABNORMAL LOW (ref 8.9–10.3)
Chloride: 104 mmol/L (ref 98–111)
Creatinine, Ser: 0.48 mg/dL (ref 0.44–1.00)
GFR, Estimated: >60 mL/min (ref >60)
Glucose, Bld: 134 mg/dL — ABNORMAL HIGH (ref 70–99)
Potassium: 2.9 mmol/L — ABNORMAL LOW (ref 3.5–5.1)
Sodium: 140 mmol/L (ref 135–145)
Total Bilirubin: 0.7 mg/dL (ref 0.3–1.2)
Total Protein: 7 g/dL (ref 6.5–8.1)

## 2022-10-26 LAB — CBC
HCT: 41.6 % (ref 36.0–46.0)
Hemoglobin: 13.6 g/dL (ref 12.0–15.0)
MCH: 30.6 pg (ref 26.0–34.0)
MCHC: 32.7 g/dL (ref 30.0–36.0)
MCV: 93.7 fL (ref 80.0–100.0)
Platelets: 213 10*3/uL (ref 150–400)
RBC: 4.44 MIL/uL (ref 3.87–5.11)
RDW: 13.1 % (ref 11.5–15.5)
WBC: 8.4 10*3/uL (ref 4.0–10.5)
nRBC: 0 % (ref 0.0–0.2)

## 2022-10-26 LAB — RESP PANEL BY RT-PCR (RSV, FLU A&B, COVID)  RVPGX2
Influenza A by PCR: NEGATIVE
Influenza B by PCR: NEGATIVE
Resp Syncytial Virus by PCR: NEGATIVE
SARS Coronavirus 2 by RT PCR: NEGATIVE

## 2022-10-26 LAB — LIPASE, BLOOD: Lipase: 29 U/L (ref 11–51)

## 2022-10-26 MED ORDER — LACTATED RINGERS IV BOLUS
1000.0000 mL | Freq: Once | INTRAVENOUS | Status: AC
  Administered 2022-10-26: 1000 mL via INTRAVENOUS

## 2022-10-26 MED ORDER — LACTATED RINGERS IV BOLUS
1000.0000 mL | Freq: Once | INTRAVENOUS | Status: AC
  Administered 2022-10-27: 1000 mL via INTRAVENOUS

## 2022-10-26 MED ORDER — POTASSIUM CHLORIDE 10 MEQ/100ML IV SOLN
10.0000 meq | INTRAVENOUS | Status: AC
  Administered 2022-10-26 – 2022-10-27 (×2): 10 meq via INTRAVENOUS
  Filled 2022-10-26 (×2): qty 100

## 2022-10-26 MED ORDER — ONDANSETRON HCL 4 MG/2ML IJ SOLN
4.0000 mg | Freq: Once | INTRAMUSCULAR | Status: AC
  Administered 2022-10-26: 4 mg via INTRAVENOUS
  Filled 2022-10-26: qty 2

## 2022-10-26 NOTE — ED Provider Notes (Signed)
[facility] Provider Note    Event Date/Time   First MD Initiated Contact with Patient 10/26/22 2105     (approximate)   History   Emesis   HPI  Tina Bowman is a 77 y.o. female past medical history of progressive supranuclear palsy, GERD, hypertension COPD who presents because of vomiting.  Around 3 AM patient started saying she was going to be sick and vomited.  She has had notable episodes of emesis since then.  Did complain intermittently of some abdominal pain has not had diarrhea has not tolerated p.o.  She has been slightly more weak but otherwise has been at her baseline mental status which is she can answer yes or no questions.  Does not ambulate on her own.  She has not had cough congestion.  No sick contacts.  They ate [place] food last night and patient's husband ate the same food and did not get sick.     Past Medical History:  Diagnosis Date   Arthritis    Asthma    COPD (chronic obstructive pulmonary disease) (HCC)    Fibromyalgia    GERD (gastroesophageal reflux disease)    Heart disease    Hypertension    Parkinsonism ([place])    Progressive supranuclear palsy ([place]) 05/23/2018   Pt reported this dx has been recinded by the MD at the [facility] disorders clinic, and replaced with Atypical Parkinsonism..   Sleep apnea    Slurred speech    foggy minded    Patient Active Problem List   Diagnosis Date Noted   Fracture, subtrochanteric, left femur, closed, with nonunion, subsequent encounter 03/22/2022   Hip fracture ([place]) 06/11/2021   Confusion 06/21/2017     Physical Exam  Triage Vital Signs: ED Triage Vitals  Enc Vitals Group     BP 10/26/22 1811 139/68     Pulse Rate 10/26/22 1811 (!) 103     Resp 10/26/22 1811 18     Temp 10/26/22 1811 98 F (36.7 C)     Temp Source 10/26/22 1811 Oral     SpO2 10/26/22 1811 95 %     Weight --      Height --      Head Circumference --      Peak Flow --      Pain Score 10/26/22 1805  0     Pain Loc --      Pain Edu? --      Excl. in [place]? --     Most recent vital signs: Vitals:   10/26/22 2230 10/26/22 2330  BP: (!) 149/68 (!) 122/58  Pulse: (!) 111 (!) 113  Resp: 16 16  Temp:    SpO2: 97% 97%     General: Awake, no distress.  CV:  Good peripheral perfusion.  Tachycardic Resp:  Normal effort.  Abd:  No distention.  Abdomen is soft nontender Neuro:             Awake, Alert, patient answers yes and no but says minimal otherwise Other:  Dry mucous membranes   ED Results / Procedures / Treatments  Labs (all labs ordered are listed, but only abnormal results are displayed) Labs Reviewed  COMPREHENSIVE METABOLIC PANEL - Abnormal; Notable for the following components:      Result Value   Potassium 2.9 (*)    Glucose, Bld 134 (*)    Calcium 8.3 (*)    All other components within normal limits  URINALYSIS, ROUTINE W REFLEX  MICROSCOPIC - Abnormal; Notable for the following components:   Color, Urine YELLOW (*)    APPearance HAZY (*)    Specific Gravity, Urine 1.032 (*)    Ketones, ur 5 (*)    Protein, ur 30 (*)    Bacteria, UA RARE (*)    All other components within normal limits  RESP PANEL BY RT-PCR (RSV, FLU A&B, COVID)  RVPGX2  LIPASE, BLOOD  CBC     EKG  EKG reviewed interpreted myself shows sinus tachycardia with normal axis normal intervals diffuse ST depression   RADIOLOGY  I reviewed interpreted the CT of the abdomen pelvis that does not have any acute findings  PROCEDURES:  Critical Care performed: No  Procedures  The patient is on the cardiac monitor to evaluate for evidence of arrhythmia and/or significant heart rate changes.   MEDICATIONS ORDERED IN ED: Medications  potassium chloride 10 mEq in 100 mL IVPB (10 mEq Intravenous New Bag/Given 10/27/22 0006)  lactated ringers bolus 1,000 mL (1,000 mLs Intravenous New Bag/Given 10/26/22 2253)  ondansetron (ZOFRAN) injection 4 mg (4 mg Intravenous Given 10/26/22 2154)  lactated ringers  bolus 1,000 mL (1,000 mLs Intravenous New Bag/Given 10/27/22 0018)     IMPRESSION / MDM / ASSESSMENT AND PLAN / ED COURSE  I reviewed the triage vital signs and the nursing notes.                              Patient's presentation is most consistent with acute complicated illness / injury requiring diagnostic workup.  Differential diagnosis includes, but is not limited to, viral gastroenteritis, influenza, COVID, bowel obstruction, hypovolemia  The patient is a 77 year old female presenting with vomiting.  She is accompanied by her husband who provides a history that she has had multiple episodes of emesis since last night.  No diarrhea but did complain of some abdominal pain.  Has been slightly more weak but is at her mental status baseline otherwise.  She is tachycardic on arrival but otherwise her vitals are reassuring.  She answers yes/no questions.  Looks somewhat dry.  Her abdomen is soft she does say she has some pain.  Labs are notable for hypokalemia with potassium 2.9 likely in the setting of emesis.  Renal function is okay.  She has no leukocytosis negative lipase.  UA not consistent with infection.  COVID and flu test negative.  I did obtain a CT abdomen pelvis without contrast given patient is more difficult to obtain history from.  This does not have any acute findings.  Patient given IV potassium supplementation bolus of fluid she is still getting at the time of signout.  She does remain somewhat tachycardic would like to see her heart rate improved with additional fluid.  She is tolerating p.o. has not vomited the ED so anticipate that she will be able to be discharged.       FINAL CLINICAL IMPRESSION(S) / ED DIAGNOSES   Final diagnoses:  Nausea and vomiting, unspecified vomiting type  Hypokalemia     Rx / DC Orders   ED Discharge Orders     None        Note:  This document was prepared using Dragon voice recognition software and may include unintentional dictation  errors.   Rada Hay, MD 10/27/22 Lupita Shutter

## 2022-10-26 NOTE — ED Triage Notes (Signed)
Pt comes via EMS from home with c/o N/V for last few hours. Pt is at baseline per family  T-100 ax CBG-171 RR-20 HR-95 BP-125/53 93% RA  20 RAC 4 zofran given 400 fluids given

## 2022-10-27 MED ORDER — ONDANSETRON HCL 4 MG/2ML IJ SOLN
4.0000 mg | INTRAMUSCULAR | Status: AC
  Administered 2022-10-27: 4 mg via INTRAVENOUS
  Filled 2022-10-27: qty 2

## 2022-10-27 MED ORDER — ONDANSETRON 4 MG PO TBDP: ORAL_TABLET | ORAL | 0 refills | Status: AC

## 2022-10-27 MED ORDER — POTASSIUM CHLORIDE CRYS ER 20 MEQ PO TBCR: 20.0000 meq | EXTENDED_RELEASE_TABLET | Freq: Every day | ORAL | 0 refills | Status: AC

## 2022-10-27 NOTE — ED Provider Notes (Signed)
-----------------------------------------   12:55 AM on 10/27/2022 -----------------------------------------  Assuming care from Dr. Starleen Blue.  In short, Tina Bowman is a 77 y.o. female with a chief complaint of nausea and vomiting.  Refer to the original H&P for additional details.  The current plan of care is to reassess and discharge if the patient is able to keep down p.o. fluids and if the patient and husband are still comfortable with the plan..   0030: I reassessed the patient and she has had no additional vomiting.  Her heart rate is still about 110, but only about 400 mL of crystalloid have infused.  I talked with the patient's husband and he is ready to go.  the patient also indicates to the best of her ability that she is feeling better.  I wrote prescriptions as listed below and encouraged them to stay for the fluids but I put them up for discharge whenever they decide that they are done and ready to go.  The husband is her primary caregiver for her supranuclear palsy and is comfortable with the plan for discharge and outpatient follow-up.  I explained that her labs are generally reassuring and we do not have a specific diagnosis but she most likely has a mild viral illness.  I gave my usual and customary return precautions.      Medications  lactated ringers bolus 1,000 mL (0 mLs Intravenous Stopped 10/27/22 0131)  ondansetron (ZOFRAN) injection 4 mg (4 mg Intravenous Given 10/26/22 2154)  potassium chloride 10 mEq in 100 mL IVPB (0 mEq Intravenous Stopped 10/27/22 0106)  lactated ringers bolus 1,000 mL (0 mLs Intravenous Stopped 10/27/22 0131)  ondansetron (ZOFRAN) injection 4 mg (4 mg Intravenous Given 10/27/22 0124)     ED Discharge Orders          Ordered    ondansetron (ZOFRAN-ODT) 4 MG disintegrating tablet        10/27/22 0058    potassium chloride SA (KLOR-CON M20) 20 MEQ tablet  Daily        10/27/22 0058           Final diagnoses:  Nausea and vomiting, unspecified  vomiting type  Hypokalemia     Hinda Kehr, MD 10/27/22 [phone]

## 2022-10-27 NOTE — Discharge Instructions (Addendum)

## 2023-03-24 DEATH — deceased

## 2023-06-02 IMAGING — CT CT HEAD W/O CM
3 series · 15 of 47 positions shown, 18 images · non-contrast
Comparison: None.

CLINICAL DATA: Unwitnessed fall while going to the bathroom.

EXAM:
CT HEAD WITHOUT CONTRAST
CT CERVICAL SPINE WITHOUT CONTRAST
TECHNIQUE: Multidetector CT imaging of the head and cervical spine was
performed following the standard protocol without intravenous
contrast. Multiplanar CT image reconstructions of the cervical spine
were also generated.

[Series 2: head wo · axial · 0.46mm/px · z∈[+306,+436]mm · 9 of 32 slices shown, 12 images]
[im 3/32  brain]
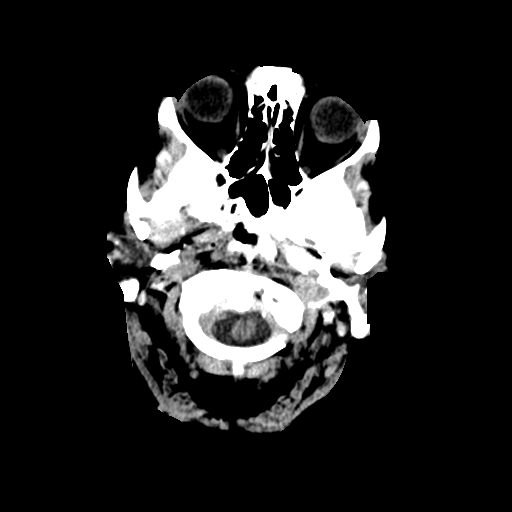
[im 3/32  bone]
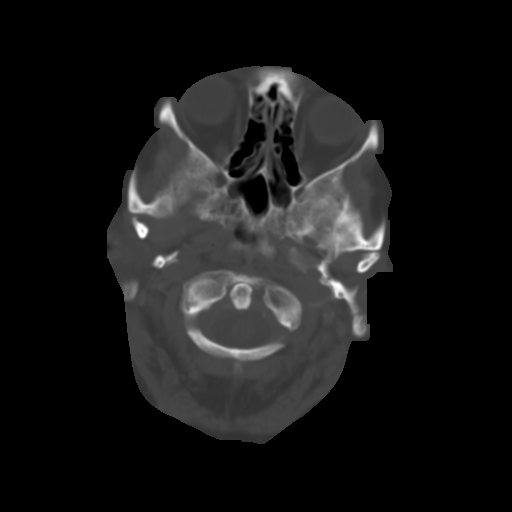
[im 6/32  brain]
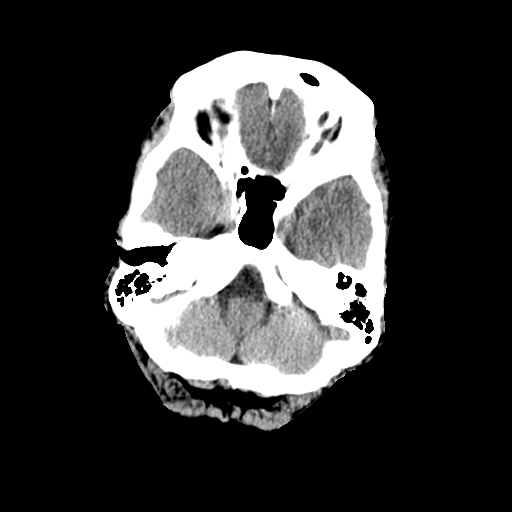
[im 9/32  brain]
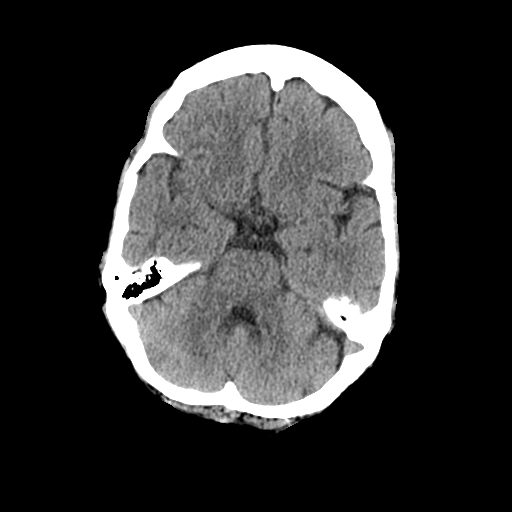
[im 12/32  brain]
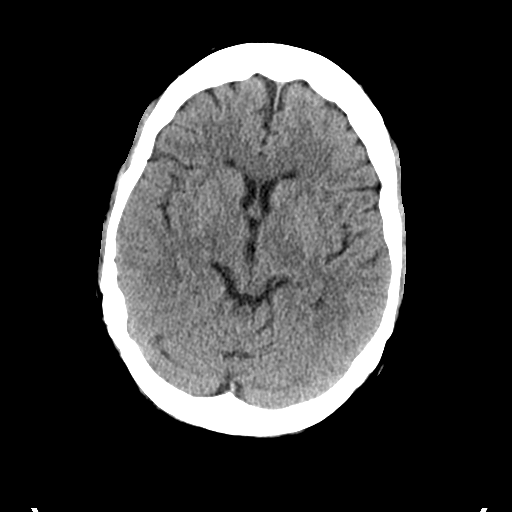
[im 17/32  brain]
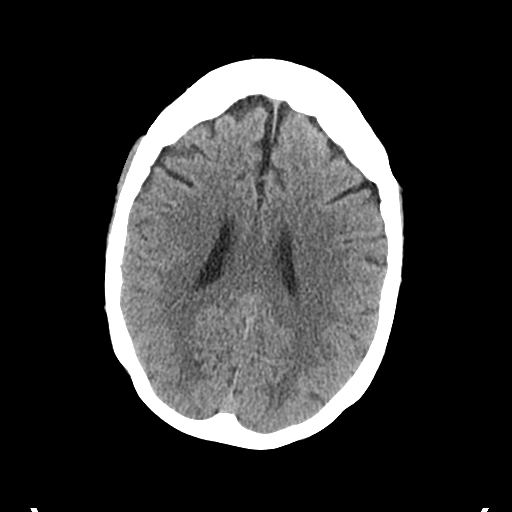
[im 17/32  bone]
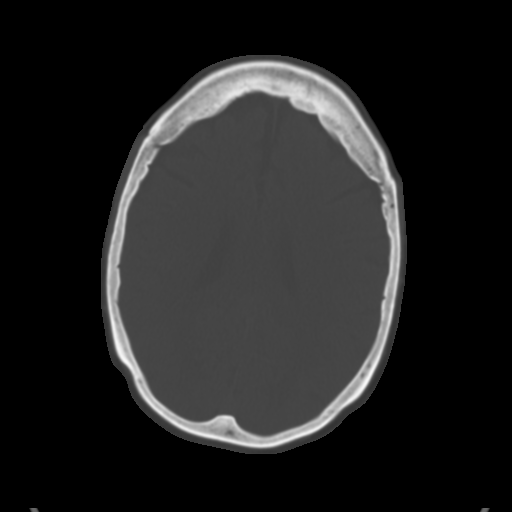
[im 20/32  brain]
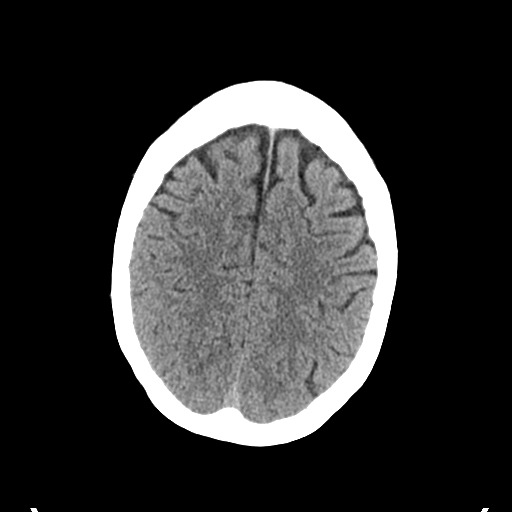
[im 23/32  brain]
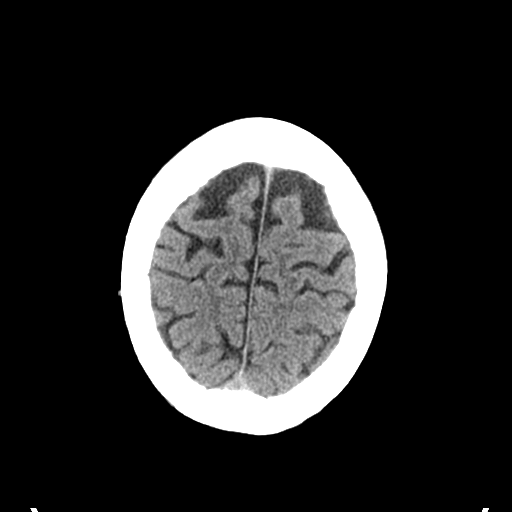
[im 26/32  brain]
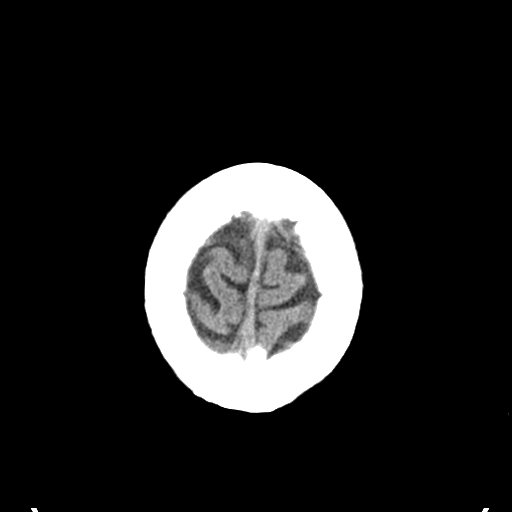
[im 29/32  brain]
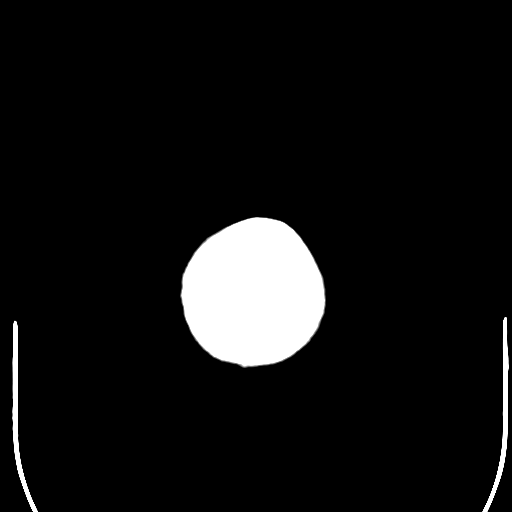
[im 29/32  bone]
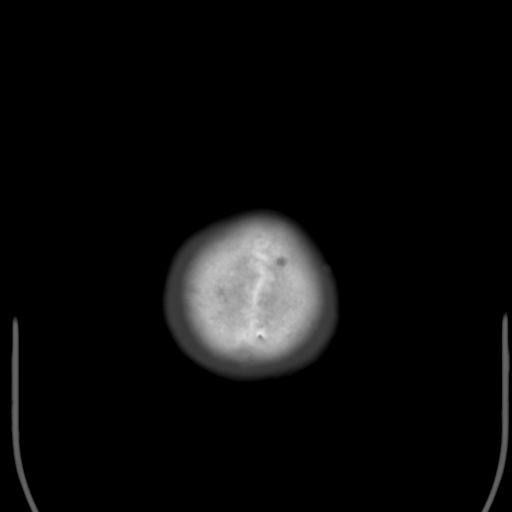

[Series 4: coronal soft tissue · coronal · 0.32mm/px · 3 of 64 slices shown]
[im 22/64  brain]
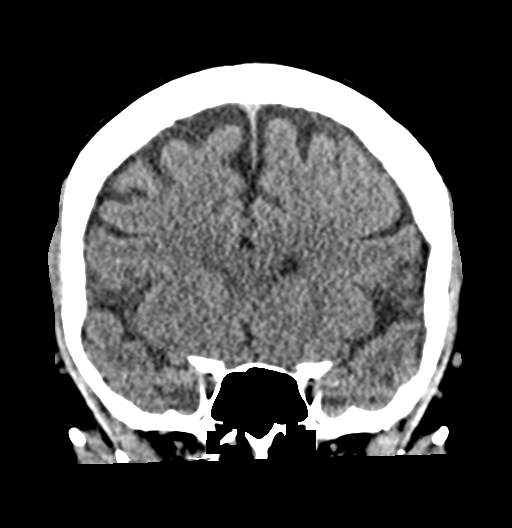
[im 29/64  brain]
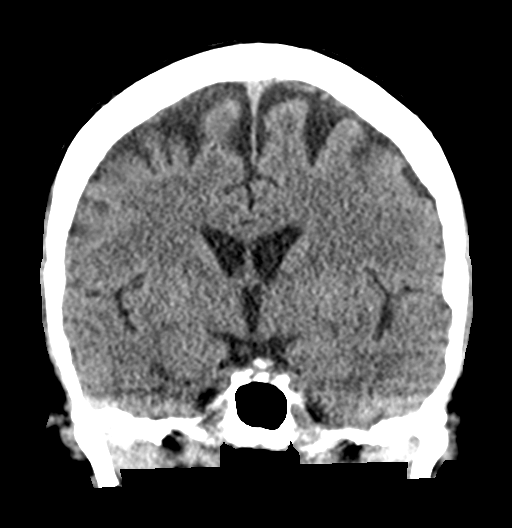
[im 36/64  brain]
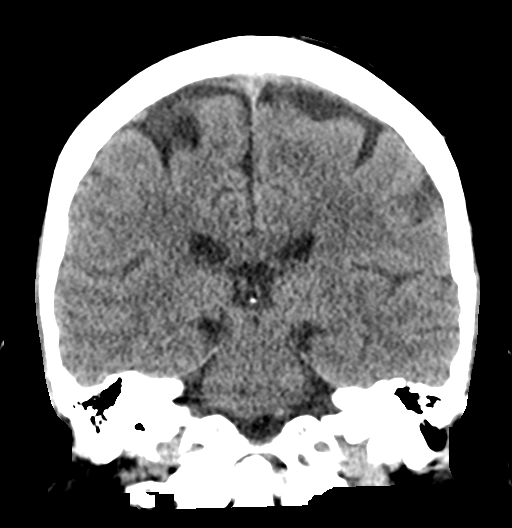

[Series 5: sagittal soft tissue · sagittal · 0.32mm/px · 3 of 54 slices shown]
[im 18/54  brain]
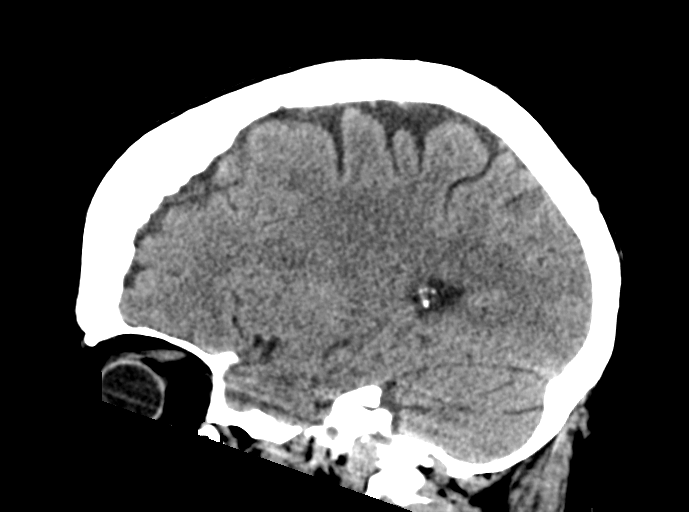
[im 27/54  brain]
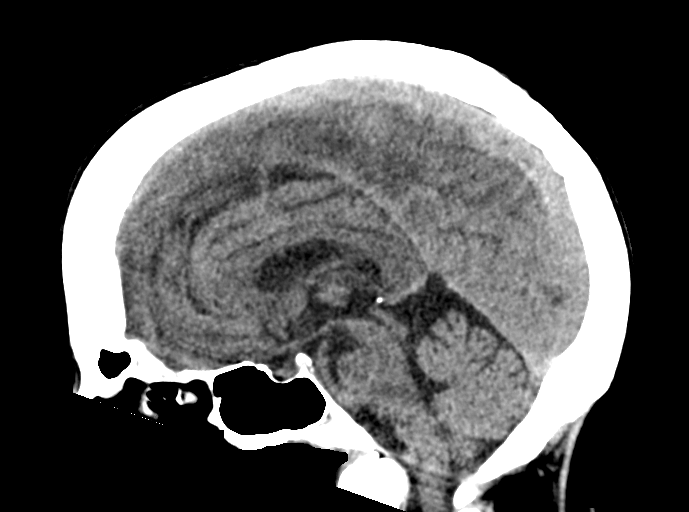
[im 36/54  brain]
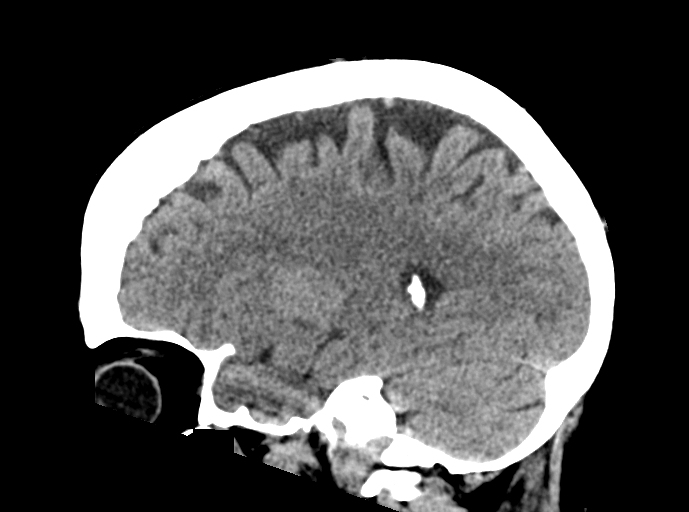

[15 of 47 positions shown; findings below may reference images not displayed]

FINDINGS: CT HEAD FINDINGS

Brain: No evidence of acute infarction, hemorrhage, hydrocephalus,
extra-axial collection or mass lesion/mass effect.

Vascular: There is minimal atherosclerotic calcification of the
internal carotid arteries. No hyperdense vessels.

Skull: Normal. Negative for fracture or focal lesion.

Sinuses/Orbits: Ethmoid air cells are normal. Partially imaged LEFT
maxillary sinus air-fluid level. Mastoid air cells are normal.

Other: None

CT CERVICAL SPINE FINDINGS

Alignment: There is reversal of the cervical lordosis in the mid
cervical levels, related to degenerative changes. There is 2
millimeters anterolisthesis of C5 on C6.

Skull base and vertebrae: No acute fracture. No primary bone lesion
or focal pathologic process.

Soft tissues and spinal canal: No prevertebral fluid or swelling. No
visible canal hematoma.

Disc levels: Significant disc height loss and uncovertebral spurring
at C5-6, C6-7, and C7-T1.

Upper chest: Negative.

Other: Note is made of air-fluid level within the LEFT maxillary
sinus, associated with a minimally displaced fracture of the LATERAL
wall. Remote ORIF of the RIGHT clavicle.
IMPRESSION: 1.  No evidence for acute intracranial abnormality.
2. Moderate mid cervical degenerative changes without acute
fracture.
3. Fracture of the LATERAL wall of the LEFT maxillary sinus,
associated with air-fluid level sinus.

## 2023-06-02 IMAGING — DX DG HIP (WITH OR WITHOUT PELVIS) 2-3V*L*
3 series · 3 of 3 positions shown · non-contrast
Comparison: None.

CLINICAL DATA: Unwitnessed fall in the bathroom.

EXAM:
DG HIP (WITH OR WITHOUT PELVIS) 2-3V LEFT

[pelvis ap]
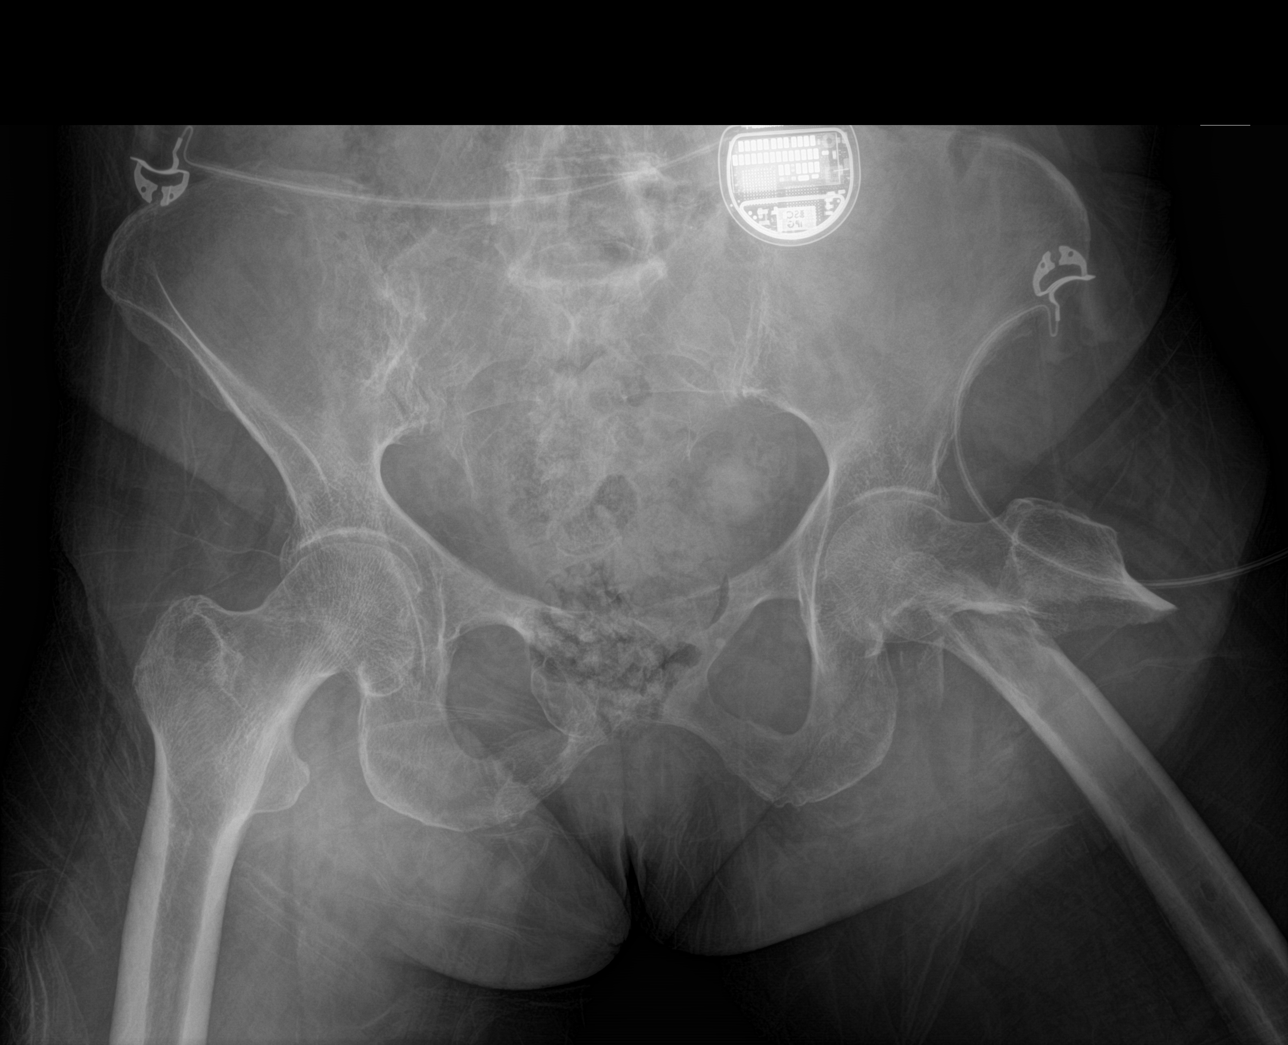

[hip ap]
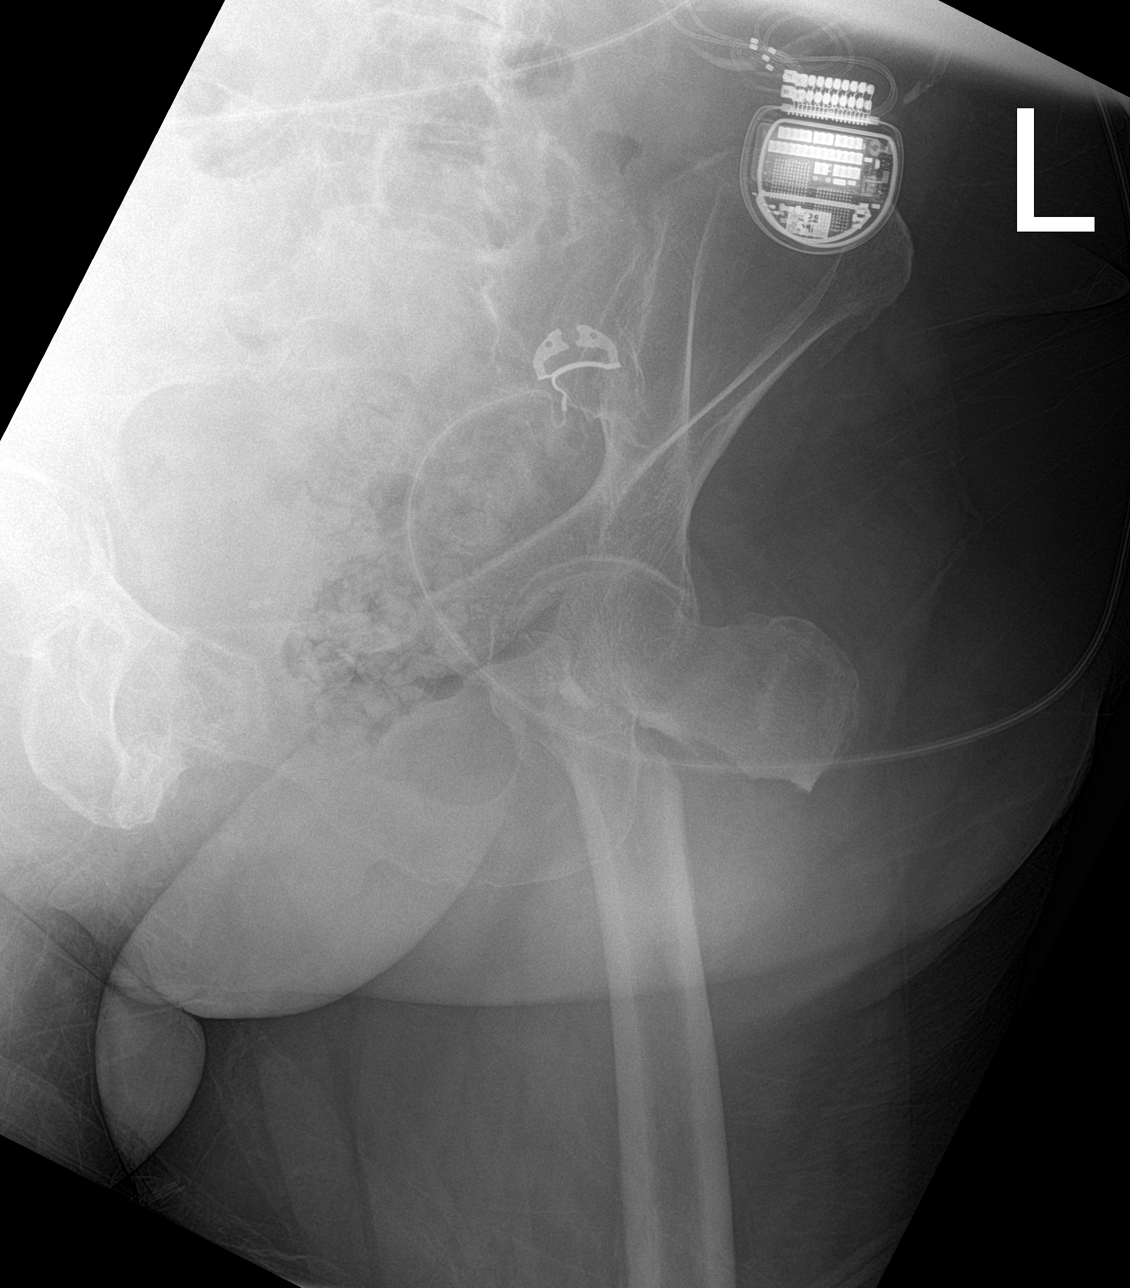

[hip lat]
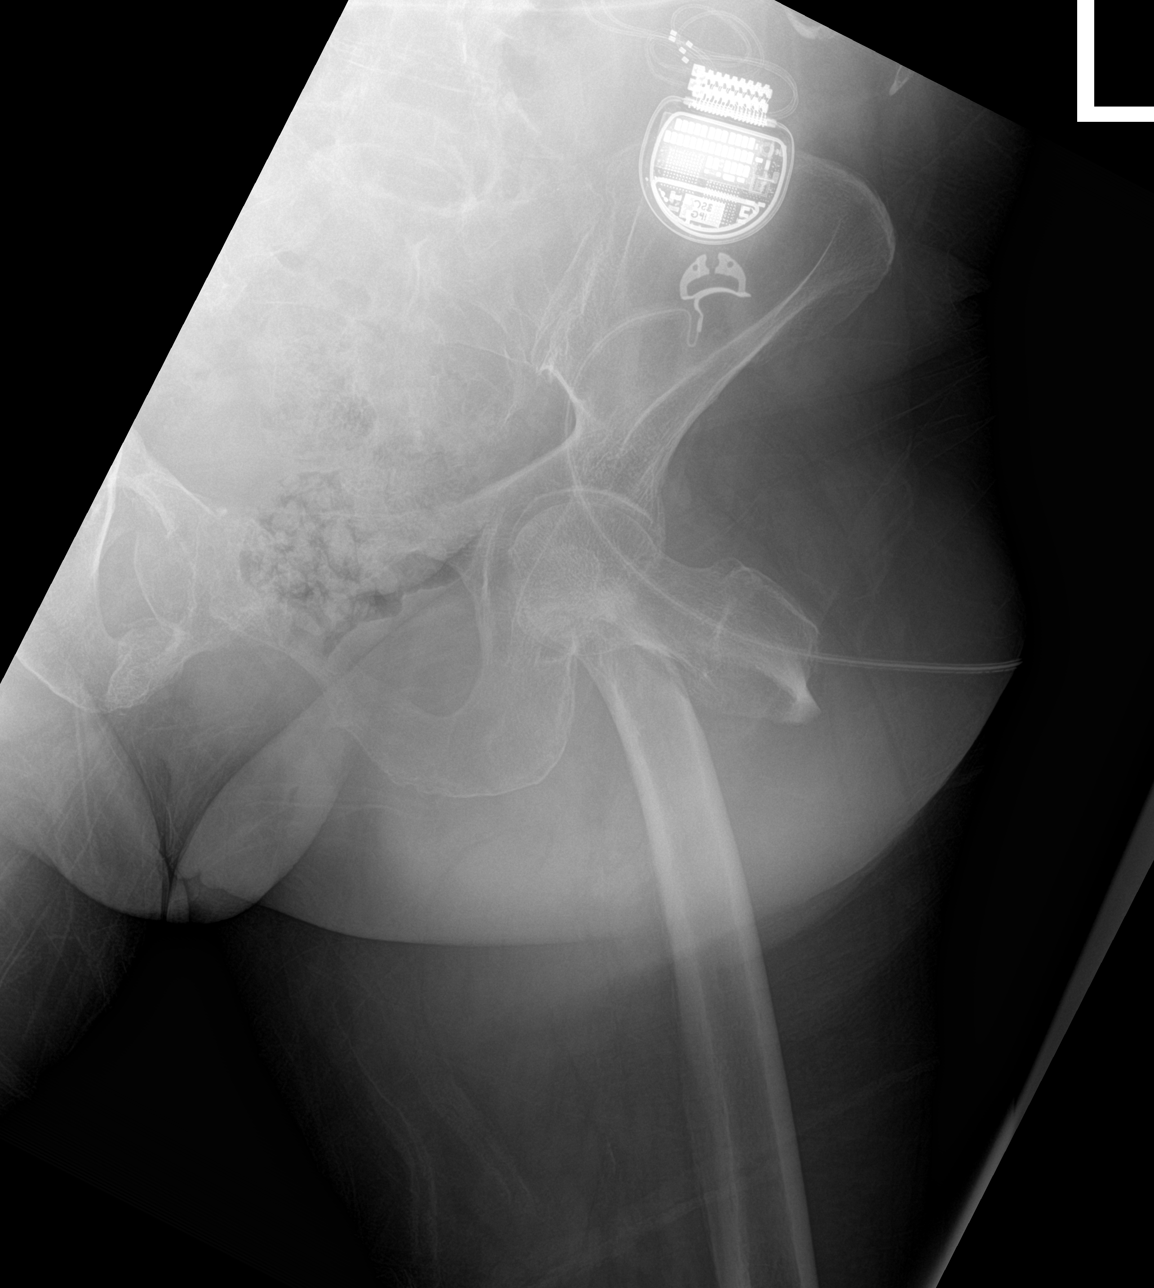

[3 of 3 positions shown; findings below may reference images not displayed]

FINDINGS: There is an acute, horizontal, displaced fracture involving the
intertrochanteric region of the femur with foreshortening and
anteromedial displacement of the distal fracture fragment. No
definitive intra-articular extension. Expected adjacent soft tissue
swelling. No radiopaque foreign body

No additional fractures identified. Spinal stimulator device
overlies the medial aspect the left buttocks.
IMPRESSION: Acute, horizontal, displaced left intertrochanteric femur fracture
with foreshortening and displacement.
